# Patient Record
Sex: Female | Born: 1985 | Race: White | Hispanic: No | Marital: Married | Smoking: Former smoker
Health system: Southern US, Community
[De-identification: ages and names within clinical notes are randomized; demographics above are authoritative.]

## PROBLEM LIST (undated history)

## (undated) ENCOUNTER — Inpatient Hospital Stay: Payer: Self-pay

## (undated) DIAGNOSIS — E039 Hypothyroidism, unspecified: Secondary | ICD-10-CM

## (undated) DIAGNOSIS — F4310 Post-traumatic stress disorder, unspecified: Secondary | ICD-10-CM

## (undated) DIAGNOSIS — F32A Depression, unspecified: Secondary | ICD-10-CM

## (undated) DIAGNOSIS — D649 Anemia, unspecified: Secondary | ICD-10-CM

## (undated) DIAGNOSIS — K219 Gastro-esophageal reflux disease without esophagitis: Secondary | ICD-10-CM

## (undated) DIAGNOSIS — I10 Essential (primary) hypertension: Secondary | ICD-10-CM

## (undated) DIAGNOSIS — O99210 Obesity complicating pregnancy, unspecified trimester: Secondary | ICD-10-CM

## (undated) HISTORY — PX: FRACTURE SURGERY: SHX138

---
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
## 2007-03-28 ENCOUNTER — Emergency Department: Payer: Self-pay | Admitting: Emergency Medicine

## 2009-03-26 ENCOUNTER — Ambulatory Visit: Payer: Self-pay | Admitting: Family Medicine

## 2009-08-09 ENCOUNTER — Emergency Department: Payer: Self-pay | Admitting: Emergency Medicine

## 2009-10-08 ENCOUNTER — Ambulatory Visit: Payer: Self-pay | Admitting: Family Medicine

## 2010-10-03 ENCOUNTER — Emergency Department: Payer: Self-pay | Admitting: Emergency Medicine

## 2012-11-09 ENCOUNTER — Emergency Department: Payer: Self-pay | Admitting: Emergency Medicine

## 2014-04-03 NOTE — L&D Delivery Note (Signed)
Delivery Note At 12:12 PM a viable vigorous  female infant was delivered via Vaginal, Spontaneous Delivery (Presentation: ; Occiput Posterior).  APGAR: 8, 9; weight 6 lb 11.8 oz (3055 g).   Placenta status: Intact, Spontaneous followed by some uterine atony. Cytotec 800 mcg per rectum, IV Pitocin bolus,  and bimanual resolved atony  Cord: 3 vessels with the following complications: Short cord  Anesthesia: Epidural  Episiotomy: None Lacerations: 2nd degree Suture Repair: 2.0 vicryl and 3-0 Chromic Est. Blood Loss (mL): 450  Mom to remain in L&D for 24 hours for magnesium sulfate.  Baby to Couplet care / Skin to Skin.  Steward Sames 02/10/2015, 1:18 PM

## 2014-08-03 ENCOUNTER — Other Ambulatory Visit: Payer: Self-pay | Admitting: Family Medicine

## 2014-08-03 DIAGNOSIS — Z3401 Encounter for supervision of normal first pregnancy, first trimester: Secondary | ICD-10-CM

## 2014-08-03 LAB — OB RESULTS CONSOLE HIV ANTIBODY (ROUTINE TESTING): HIV: NONREACTIVE

## 2014-08-04 LAB — OB RESULTS CONSOLE RUBELLA ANTIBODY, IGM: RUBELLA: IMMUNE

## 2014-08-04 LAB — OB RESULTS CONSOLE HEPATITIS B SURFACE ANTIGEN: Hepatitis B Surface Ag: NEGATIVE

## 2014-08-04 LAB — OB RESULTS CONSOLE VARICELLA ZOSTER ANTIBODY, IGG: VARICELLA IGG: IMMUNE

## 2014-08-07 ENCOUNTER — Ambulatory Visit: Admission: RE | Admit: 2014-08-07 | Payer: Medicaid Other | Source: Ambulatory Visit

## 2014-08-07 ENCOUNTER — Ambulatory Visit
Admission: RE | Admit: 2014-08-07 | Discharge: 2014-08-07 | Disposition: A | Discharge status: Home / Self Care | Payer: Medicaid Other | Source: Ambulatory Visit | Attending: Family Medicine | Admitting: Family Medicine

## 2014-08-07 DIAGNOSIS — Z36 Encounter for antenatal screening of mother: Secondary | ICD-10-CM | POA: Insufficient documentation

## 2014-08-07 DIAGNOSIS — Z3401 Encounter for supervision of normal first pregnancy, first trimester: Secondary | ICD-10-CM

## 2014-09-30 ENCOUNTER — Other Ambulatory Visit: Payer: Self-pay | Admitting: Advanced Practice Midwife

## 2014-09-30 DIAGNOSIS — R030 Elevated blood-pressure reading, without diagnosis of hypertension: Secondary | ICD-10-CM

## 2014-09-30 DIAGNOSIS — IMO0001 Reserved for inherently not codable concepts without codable children: Secondary | ICD-10-CM

## 2014-09-30 DIAGNOSIS — E669 Obesity, unspecified: Secondary | ICD-10-CM

## 2014-10-26 ENCOUNTER — Ambulatory Visit
Admission: RE | Admit: 2014-10-26 | Discharge: 2014-10-26 | Disposition: A | Discharge status: Home / Self Care | Payer: Medicaid Other | Source: Ambulatory Visit | Attending: Obstetrics and Gynecology | Admitting: Obstetrics and Gynecology

## 2014-10-26 VITALS — BP 119/62 | HR 98 | Temp 98.1°F | Ht 64.5 in | Wt 252.0 lb

## 2014-10-26 DIAGNOSIS — Z6841 Body Mass Index (BMI) 40.0 and over, adult: Secondary | ICD-10-CM | POA: Insufficient documentation

## 2014-10-26 DIAGNOSIS — IMO0001 Reserved for inherently not codable concepts without codable children: Secondary | ICD-10-CM

## 2014-10-26 DIAGNOSIS — E669 Obesity, unspecified: Secondary | ICD-10-CM | POA: Diagnosis not present

## 2014-10-26 DIAGNOSIS — Z3A22 22 weeks gestation of pregnancy: Secondary | ICD-10-CM | POA: Insufficient documentation

## 2014-10-26 DIAGNOSIS — Z8279 Family history of other congenital malformations, deformations and chromosomal abnormalities: Secondary | ICD-10-CM

## 2014-10-26 DIAGNOSIS — Z8249 Family history of ischemic heart disease and other diseases of the circulatory system: Secondary | ICD-10-CM | POA: Diagnosis not present

## 2014-10-26 DIAGNOSIS — O99212 Obesity complicating pregnancy, second trimester: Secondary | ICD-10-CM | POA: Insufficient documentation

## 2014-10-26 DIAGNOSIS — R030 Elevated blood-pressure reading, without diagnosis of hypertension: Secondary | ICD-10-CM

## 2014-10-26 DIAGNOSIS — O99210 Obesity complicating pregnancy, unspecified trimester: Secondary | ICD-10-CM | POA: Insufficient documentation

## 2014-10-26 HISTORY — DX: Obesity complicating pregnancy, unspecified trimester: O99.210

## 2014-10-26 LAB — US OB DETAIL + 14 WK

## 2014-10-26 NOTE — Progress Notes (Signed)
Obstetrics Admission History & Physical  CC: Elevated BP at 19 weeks (normal both prior to and subsequent to that visit)  HPI:  29 y.o. G1P0 @ [redacted]w[redacted]d (by L=11wk [place]). Pregnancy c/b: elevated BMI and one abnormal BP (138/90) at 19 weeks. Presents for recommendations for paregnancy management.    PMHx: Patient  has a past medical history of Obesity affecting pregnancy.  PSHx: She  has past surgical history that includes Fracture surgery (Right, 29 years old). Medications:  (Not in a hospital admission) Allergies: is allergic to latex. OBHx: G1 GYNHx: History of abnormal pap smears: Yes.   (ASCUS with + HPV) - most recent PAP per patienet was at the HD and was normal.                History of STIs: Yes.  (HPV).             FHx: family history includes Asthma in her brother; Cirrhosis in her paternal grandmother; Depression in her mother; Diabetes in her maternal grandfather; Emphysema in her maternal grandmother; Heart disease in her father and paternal grandfather. Soc Hx:  reports that she quit smoking about 2 months ago. She has never used smokeless tobacco. She reports that she does not drink alcohol or use illicit drugs.     FOB is involved with the pregnancy - he reports that his sister had twins that died in the neonatal period thought to be due to congenital heart disease.  Objective:  LMP 05/25/2014 (Exact Date)  119/62 13  General: Well nourished, well developed female in no acute distress.    Perinatal info:  B+/-/ R unknown / TDaP pending / RPR NR / HIV unknown/ HBsAg neg/ Pap (ASCUS with +HPV in 2014), normal this pregnancy per patient/early glucola 79  Assessment & Plan:  29 y.o. G1P0 @ [redacted]w[redacted]d (by L=11wk [place]) with elevated BMI and one elevated BP. We discussed the impact of pregnancy on blood pressure as well as the increased risk of uteroplacental insufficiency and/or development of superimposed preeclampsia in pregnancy.  The patient will need an updated EKG to assess cardiac  status prior to pregnancy.  I also recommend assessment of baseline proteinuria, kidney and liver function in early pregnancy. Would also recommend baseline TSH given elevated BMI and additional  of folic acid.   I agree with the initiation of daily  aspirin to reduce risk of developing preeclampsia. I have reviewed the most common symptoms of preeclampsia.  Goal values for BP measurement in clinic are less than 160/110 mmHg. If anti-hypertensive therapy is indicated, consider labetalol or nifedipine.    I recommend antenatal fetal assessment as outlined below. Antenatal fetal assessment: I recommend monthly ultrasound assessment of fetal growth beginning at 28 weeks. I recommend weekly antenatal testing with NST, BPP, or modified BPP starting at 34-36 weeks, and would start sooner (approximately 32 weeks) if medication is needed to keep blood pressures in goal range.    Regarding delivery planning, I recommend delivery ranging from 37-40 weeks.  Specifically, 37-38 weeks for patients with poorly or uncontrolled hypertension and 39-40 weeks for patients with well-controlled hypertension.  I have taken the liberty of scheduling a follow up ultrasound for growth in 6 weeks and a fetal echocardiogram due to the father of the baby's family history of congenital heart disease.  Thank you for the opportunity to care for [name]; we'd be happy to be involved with her care in the future.

## 2014-11-15 ENCOUNTER — Emergency Department
Admission: EM | Admit: 2014-11-15 | Discharge: 2014-11-15 | Disposition: A | Discharge status: Home / Self Care | Payer: Medicaid Other | Attending: Emergency Medicine | Admitting: Emergency Medicine

## 2014-11-15 ENCOUNTER — Emergency Department: Payer: Medicaid Other

## 2014-11-15 ENCOUNTER — Encounter: Payer: Self-pay | Admitting: Emergency Medicine

## 2014-11-15 DIAGNOSIS — Z91040 Latex allergy status: Secondary | ICD-10-CM | POA: Diagnosis not present

## 2014-11-15 DIAGNOSIS — Z7982 Long term (current) use of aspirin: Secondary | ICD-10-CM | POA: Diagnosis not present

## 2014-11-15 DIAGNOSIS — Z87891 Personal history of nicotine dependence: Secondary | ICD-10-CM | POA: Diagnosis not present

## 2014-11-15 DIAGNOSIS — Z79899 Other long term (current) drug therapy: Secondary | ICD-10-CM | POA: Diagnosis not present

## 2014-11-15 DIAGNOSIS — O9989 Other specified diseases and conditions complicating pregnancy, childbirth and the puerperium: Secondary | ICD-10-CM | POA: Insufficient documentation

## 2014-11-15 DIAGNOSIS — E669 Obesity, unspecified: Secondary | ICD-10-CM | POA: Insufficient documentation

## 2014-11-15 DIAGNOSIS — R0789 Other chest pain: Secondary | ICD-10-CM | POA: Insufficient documentation

## 2014-11-15 DIAGNOSIS — R2243 Localized swelling, mass and lump, lower limb, bilateral: Secondary | ICD-10-CM | POA: Diagnosis not present

## 2014-11-15 DIAGNOSIS — Z3A25 25 weeks gestation of pregnancy: Secondary | ICD-10-CM | POA: Insufficient documentation

## 2014-11-15 DIAGNOSIS — O99212 Obesity complicating pregnancy, second trimester: Secondary | ICD-10-CM | POA: Insufficient documentation

## 2014-11-15 LAB — BASIC METABOLIC PANEL
Anion gap: 7 (ref 5–15)
BUN: 9 mg/dL (ref 6–20)
CO2: 26 mmol/L (ref 22–32)
Calcium: 9 mg/dL (ref 8.9–10.3)
Chloride: 103 mmol/L (ref 101–111)
Creatinine, Ser: 0.58 mg/dL (ref 0.44–1.00)
Glucose, Bld: 106 mg/dL — ABNORMAL HIGH (ref 65–99)
Potassium: 4 mmol/L (ref 3.5–5.1)
SODIUM: 136 mmol/L (ref 135–145)

## 2014-11-15 LAB — CBC
HCT: 34.8 % — ABNORMAL LOW (ref 35.0–47.0)
HEMOGLOBIN: 11.5 g/dL — AB (ref 12.0–16.0)
MCH: 28.9 pg (ref 26.0–34.0)
MCHC: 32.9 g/dL (ref 32.0–36.0)
MCV: 87.7 fL (ref 80.0–100.0)
PLATELETS: 275 10*3/uL (ref 150–440)
RBC: 3.97 MIL/uL (ref 3.80–5.20)
RDW: 14.5 % (ref 11.5–14.5)
WBC: 20.8 10*3/uL — ABNORMAL HIGH (ref 3.6–11.0)

## 2014-11-15 LAB — HEPATIC FUNCTION PANEL
ALK PHOS: 58 U/L (ref 38–126)
ALT: 14 U/L (ref 14–54)
AST: 17 U/L (ref 15–41)
Albumin: 3.6 g/dL (ref 3.5–5.0)
Bilirubin, Direct: <0.1 mg/dL — ABNORMAL LOW (ref 0.1–0.5)
Total Protein: 7.4 g/dL (ref 6.5–8.1)

## 2014-11-15 LAB — TROPONIN I

## 2014-11-15 MED ORDER — IOHEXOL 350 MG/ML SOLN
100.0000 mL | Freq: Once | INTRAVENOUS | Status: AC | PRN
  Administered 2014-11-15: 100 mL via INTRAVENOUS

## 2014-11-15 NOTE — ED Notes (Signed)
Patient reports "I broke out in hives so I took benadryl then my chest started hurting and I started coughing."  Patient reports taking a baby aspirin when chest started to hurt.  Pain is mid sternal and radiates around under right breast.

## 2014-11-15 NOTE — ED Provider Notes (Signed)
[facility] Provider Note  ____________________________________________  Time seen: Approximately 6:54 AM  I have reviewed the triage vital signs and the nursing notes.   HISTORY  Chief Complaint Chest Pain    HPI Debra Bender is a 29 y.o. female at approximately [redacted] weeks gestation who presents with acute onset mid substernal chest pain radiating around to underneath her right breast.She has never had pain like this before.  She was uncomfortable while trying to sleep during the night so she was awake anyway and states that it started acutely sometime after taking Benadryl for some hives.  She has had hives in the past and she has also taken Benadryl in the past without any issues.  She states that the chest pain was severe when it occurred, sharp and stabbing beneath her sternum and radiating through around her right breast.  It was accompanied with shortness of breath and what she describes as wheezing although she has no history of asthma or COPD.  The symptoms waxed and waned between moderate and severe for about 20 minutes before resolving.  She had nausea accompanying the chest pain but no vomiting.  She has had no abdominal pain, no pelvic pain, no vaginal bleeding or discharge.  She denies fever/chills and recent respiratory symptoms.  She is a former tobacco user but quit smoking when she became pregnant.  During that time she had her chest pain she felt better if she sat up and worse when she laid flat.  She took a baby aspirin during the chest pain but it had no effect on her symptoms.  She is currently asymptomatic.  The patient states she has no history of blood clots in her legs or her lungs, has not been traveling long distances recently, and has no history of cancer.  She takes no medications other than prenatal vitamins and a daily baby aspirin because she states that "they think might have preeclampsia".   Past Medical History   Diagnosis Date  . Obesity affecting pregnancy     Patient Active Problem List   Diagnosis Date Noted  . Maternal obesity, antepartum 10/26/2014  . Elevated BP     Past Surgical History  Procedure Laterality Date  . Fracture surgery Right 29 years old    Current Outpatient Rx  Name  Route  Sig  Dispense  Refill  . aspirin 81 MG chewable tablet   Oral   Chew 81 mg by mouth daily.         . Prenatal Vit-Fe Fumarate-FA (PRENATAL MULTIVITAMIN) TABS tablet   Oral   Take 1 tablet by mouth daily at 12 noon.           Allergies Latex  Family History  Problem Relation Age of Onset  . Depression Mother   . Heart disease Father   . Asthma Brother   . Emphysema Maternal Grandmother   . Diabetes Maternal Grandfather   . Cirrhosis Paternal Grandmother   . Heart disease Paternal Grandfather     Social History Social History  Substance Use Topics  . Smoking status: Former Smoker    Quit date: 07/31/2014  . Smokeless tobacco: Never Used  . Alcohol Use: No    Review of Systems Constitutional: No fever/chills Eyes: No visual changes. ENT: No sore throat. Cardiovascular: Acute onset substernal chest pain radiating through to the right, now resolved Respiratory: Shortness of breath accompanying the chest pain now resolved Gastrointestinal: No abdominal pain.  No nausea, no vomiting.  No diarrhea.  No constipation. Genitourinary: Negative for dysuria. Musculoskeletal: Negative for back pain.  Bilateral leg swelling throughout pregnancy. Skin: Negative for rash. Neurological: Negative for headaches, focal weakness or numbness.  10-point ROS otherwise negative.  ____________________________________________   PHYSICAL EXAM:  VITAL SIGNS: ED Triage Vitals  Enc Vitals Group     BP 11/15/14 0500 125/89 mmHg     Pulse Rate 11/15/14 0458 100     Resp 11/15/14 0458 22     Temp 11/15/14 0458 98.1 F (36.7 C)     Temp Source 11/15/14 0458 Oral     SpO2 11/15/14 0458  97 %     Weight 11/15/14 0458 253 lb (114.76 kg)     Height 11/15/14 0458 5' 4.5" (1.638 m)     Head Cir --      Peak Flow --      Pain Score 11/15/14 0459 6     Pain Loc --      Pain Edu? --      Excl. in GC? --     Constitutional: Alert and oriented. Well appearing and in no acute distress. Eyes: Conjunctivae are normal. PERRL. EOMI. Head: Atraumatic. Nose: No congestion/rhinnorhea. Mouth/Throat: Mucous membranes are moist.  Oropharynx non-erythematous. Neck: No stridor.   Cardiovascular: Normal rate, regular rhythm. Grossly normal heart sounds.  Good peripheral circulation. Respiratory: Normal respiratory effort.  No retractions. Lungs CTAB. Gastrointestinal: Obese, Soft and nontender. No distention. No abdominal bruits. No CVA tenderness. Musculoskeletal: Bilateral lower extremity pitting edema, chronic according to patient.  No joint effusions. Neurologic:  Normal speech and language. No gross focal neurologic deficits are appreciated.  Skin:  Skin is warm, dry and intact. No rash noted. Psychiatric: Mood and affect are normal. Speech and behavior are normal.  ____________________________________________   LABS (all labs ordered are listed, but only abnormal results are displayed)  Labs Reviewed  BASIC METABOLIC PANEL - Abnormal; Notable for the following:    Glucose, Bld 106 (*)    All other components within normal limits  CBC - Abnormal; Notable for the following:    WBC 20.8 (*)    Hemoglobin 11.5 (*)    HCT 34.8 (*)    All other components within normal limits  HEPATIC FUNCTION PANEL - Abnormal; Notable for the following:    Total Bilirubin <0.1 (*)    Bilirubin, Direct <0.1 (*)    All other components within normal limits  TROPONIN I  URINALYSIS COMPLETEWITH MICROSCOPIC ([facility] ONLY)   ____________________________________________  EKG  ED ECG REPORT I, Milessa Hogan, the attending physician, personally viewed and interpreted this ECG.  Date:  11/15/2014 EKG Time: 04:55 Rate: 99 Rhythm: normal sinus rhythm QRS Axis: normal Intervals: normal ST/T Wave abnormalities: Inverted T-wave in lead 3, otherwise no acute abnormalities were indication of acute ischemia Conduction Disutrbances: none Narrative Interpretation: unremarkable  ____________________________________________  RADIOLOGY  I, Samyria Rudie, personally viewed and evaluated these images (2-view CXR) as part of my medical decision making.   Dg Chest 2 View  11/15/2014   CLINICAL DATA:  Acute onset of anterior chest pain. Initial encounter.  EXAM: CHEST  2 VIEW  COMPARISON:  Chest radiograph performed 11/09/2012  FINDINGS: The lungs are well-aerated. Pulmonary vascularity is at the upper limits of normal. There is no evidence of focal opacification, pleural effusion or pneumothorax.  The heart is normal in size; the mediastinal contour is within normal limits. No acute osseous abnormalities are seen.  IMPRESSION: No acute cardiopulmonary process seen.  Electronically Signed   By: Roanna Raider M.D.   On: 11/15/2014 07:00   Ct Angio Chest Pe W/cm &/or Wo Cm  11/15/2014   CLINICAL DATA:  Substernal chest pain and dyspnea.  EXAM: CT ANGIOGRAPHY CHEST WITH CONTRAST  TECHNIQUE: Multidetector CT imaging of the chest was performed using the standard protocol during bolus administration of intravenous contrast. Multiplanar CT image reconstructions and MIPs were obtained to evaluate the vascular anatomy.  CONTRAST:  OMNIPAQUE IOHEXOL 350 MG/ML SOLN  The patient is [redacted] weeks pregnant. Consent was obtained from the patient for CT angiography with IV contrast administration. The abdomen and pelvis which shielded with lead prior to CT.  COMPARISON:  Chest x-ray earlier today.  FINDINGS: The pulmonary arteries are adequately opacified. There is no evidence of pulmonary embolism. Lungs show no evidence of infiltrates, edema or nodules. Airways are widely patent. The heart size and cardiac  chambers appear normal. No pleural or pericardial fluid is seen. The thoracic aorta is also well opacified and has a normal appearance without evidence of aneurysm or dissection.  No enlarged lymph nodes are seen. Visualized bony structures are unremarkable. Visualized upper abdomen has a normal appearance.  Review of the MIP images confirms the above findings.  IMPRESSION: Normal CTA of the chest with no evidence of pulmonary embolism or other acute findings.   Electronically Signed   By: Irish Lack M.D.   On: 11/15/2014 08:55   US Venous Img Lower Bilateral  11/15/2014   CLINICAL DATA:  Bilateral leg swelling for 4 days  EXAM: BILATERAL LOWER EXTREMITY VENOUS DOPPLER ULTRASOUND  TECHNIQUE: Gray-scale sonography with graded compression, as well as color Doppler and duplex ultrasound were performed to evaluate the lower extremity deep venous systems from the level of the common femoral vein and including the common femoral, femoral, profunda femoral, popliteal and calf veins including the posterior tibial, peroneal and gastrocnemius veins when visible. The superficial great saphenous vein was also interrogated. Spectral Doppler was utilized to evaluate flow at rest and with distal augmentation maneuvers in the common femoral, femoral and popliteal veins.  COMPARISON:  None.  FINDINGS: RIGHT LOWER EXTREMITY  Common Femoral Vein: No evidence of thrombus. Normal compressibility, respiratory phasicity and response to augmentation.  Saphenofemoral Junction: No evidence of thrombus. Normal compressibility and flow on color Doppler imaging.  Profunda Femoral Vein: No evidence of thrombus. Normal compressibility and flow on color Doppler imaging.  Femoral Vein: No evidence of thrombus. Normal compressibility, respiratory phasicity and response to augmentation.  Popliteal Vein: No evidence of thrombus. Normal compressibility, respiratory phasicity and response to augmentation.  Calf Veins: No evidence of thrombus.  Normal compressibility and flow on color Doppler imaging.  Superficial Great Saphenous Vein: No evidence of thrombus. Normal compressibility and flow on color Doppler imaging.  Venous Reflux:  None.  Other Findings:  None.  LEFT LOWER EXTREMITY  Common Femoral Vein: No evidence of thrombus. Normal compressibility, respiratory phasicity and response to augmentation.  Saphenofemoral Junction: No evidence of thrombus. Normal compressibility and flow on color Doppler imaging.  Profunda Femoral Vein: No evidence of thrombus. Normal compressibility and flow on color Doppler imaging.  Femoral Vein: No evidence of thrombus. Normal compressibility, respiratory phasicity and response to augmentation.  Popliteal Vein: No evidence of thrombus. Normal compressibility, respiratory phasicity and response to augmentation.  Calf Veins: No evidence of thrombus. Normal compressibility and flow on color Doppler imaging.  Superficial Great Saphenous Vein: No evidence of thrombus. Normal compressibility and flow on color  Doppler imaging.  Venous Reflux:  None.  Other Findings:  None.  IMPRESSION: No evidence of deep venous thrombosis bilateral lower extremity.   Electronically Signed   By: Natasha Mead M.D.   On: 11/15/2014 10:51    ____________________________________________   PROCEDURES  Procedure(s) performed: None  Critical Care performed: No ____________________________________________   INITIAL IMPRESSION / ASSESSMENT AND PLAN / ED COURSE  Pertinent labs & imaging results that were available during my care of the patient were reviewed by me and considered in my medical decision making (see chart for details).  The patient is well-appearing and in no acute distress at this time.  However, her history and signs/symptoms are concerning for the possibility of pulmonary embolism.  Given that she is at high risk, I had a discussion with her about the risks and benefits of CT angiography during the second trimester of  her pregnancy.  I explained that while there is a risk to the fetus, there is also a risk to her if we miss a pulmonary embolism.  She and her father, who is present in the room, understand and had the opportunity to ask questions.  We decided to proceed with the CTA of the chest.  I explained my concerns by phone to the on-call radiologist.  Additionally, I am going to obtain bilateral lower extremity ultrasounds to evaluate for possible DVTs given her persistent peripheral edema.  I am also going to add on a hepatic function panel given that she is at 25 weeks, but her blood pressure is generally reassuring in the emergency department today.  ----------------------------------------- 11:42 AM on 11/15/2014 -----------------------------------------  The patient had a thorough workup in the emergency department today, all of which was reassuring.  She has no evidence of a PE nor of DVTs.  She has not had any additional chest pain and has had no difficulty breathing while in the emergency department.  She and her family are comfortable with the plan for discharge and follow up with her primary care provider.  I gave my usual and customary return precautions.  ____________________________________________  FINAL CLINICAL IMPRESSION(S) / ED DIAGNOSES  Final diagnoses:  Atypical chest pain      NEW MEDICATIONS STARTED DURING THIS VISIT:  New Prescriptions   No medications on file     Loleta Rose, MD 11/15/14 1725

## 2014-11-15 NOTE — ED Notes (Signed)
Pt discharged with family.

## 2014-11-15 NOTE — Discharge Instructions (Signed)
Given your chest pain and pregnancy, we pursued a thorough evaluation including a CT scan of your chest to rule out a pulmonary embolism (blood clot in her lungs) and ultrasounds of your legs to rule out blood clots in her legs (DVT).  Fortunately her workup, including her lab work, was all reassuring and unremarkable today.  Please take Tylenol as needed for chest discomfort and follow up with your primary care doctor at the next available opportunity.  If you have any new or worsening symptoms that concern you, please return immediately to the emergency department.   Chest Pain (Nonspecific) It is often hard to give a diagnosis for the cause of chest pain. There is always a chance that your pain could be related to something serious, such as a heart attack or a blood clot in the lungs. You need to follow up with your doctor. HOME CARE  If antibiotic medicine was given, take it as directed by your doctor. Finish the medicine even if you start to feel better.  For the next few days, avoid activities that bring on chest pain. Continue physical activities as told by your doctor.  Do not use any tobacco products. This includes cigarettes, chewing tobacco, and e-cigarettes.  Avoid drinking alcohol.  Only take medicine as told by your doctor.  Follow your doctor's suggestions for more testing if your chest pain does not go away.  Keep all doctor visits you made. GET HELP IF:  Your chest pain does not go away, even after treatment.  You have a rash with blisters on your chest.  You have a fever. GET HELP RIGHT AWAY IF:   You have more pain or pain that spreads to your arm, neck, jaw, back, or belly (abdomen).  You have shortness of breath.  You cough more than usual or cough up blood.  You have very bad back or belly pain.  You feel sick to your stomach (nauseous) or throw up (vomit).  You have very bad weakness.  You pass out (faint).  You have chills. This is an emergency. Do  not wait to see if the problems will go away. Call your local emergency services (911 in [place] Do not drive yourself to the hospital. MAKE SURE YOU:   Understand these instructions.  Will watch your condition.  Will get help right away if you are not doing well or get worse. Document Released: 09/06/2007 Document Revised: 03/25/2013 Document Reviewed: 09/06/2007 [employer] Patient Information 2015 [place], [place]. This information is not intended to replace advice given to you by your health care provider. Make sure you discuss any questions you have with your health care provider.

## 2014-11-15 NOTE — ED Notes (Signed)
Pt to US.

## 2014-12-03 DIAGNOSIS — D649 Anemia, unspecified: Secondary | ICD-10-CM

## 2014-12-03 DIAGNOSIS — E039 Hypothyroidism, unspecified: Secondary | ICD-10-CM

## 2014-12-03 HISTORY — DX: Hypothyroidism, unspecified: E03.9

## 2014-12-03 HISTORY — DX: Anemia, unspecified: D64.9

## 2014-12-10 ENCOUNTER — Ambulatory Visit
Admission: RE | Admit: 2014-12-10 | Discharge: 2014-12-10 | Disposition: A | Discharge status: Home / Self Care | Payer: Medicaid Other | Source: Ambulatory Visit | Attending: Obstetrics and Gynecology | Admitting: Obstetrics and Gynecology

## 2014-12-10 VITALS — BP 122/75 | HR 95 | Temp 97.9°F | Resp 18 | Ht 63.6 in | Wt 264.6 lb

## 2014-12-10 DIAGNOSIS — O99210 Obesity complicating pregnancy, unspecified trimester: Secondary | ICD-10-CM

## 2014-12-10 DIAGNOSIS — E669 Obesity, unspecified: Secondary | ICD-10-CM | POA: Insufficient documentation

## 2014-12-10 DIAGNOSIS — Z3A28 28 weeks gestation of pregnancy: Secondary | ICD-10-CM | POA: Insufficient documentation

## 2014-12-10 DIAGNOSIS — R030 Elevated blood-pressure reading, without diagnosis of hypertension: Secondary | ICD-10-CM

## 2014-12-10 DIAGNOSIS — O99212 Obesity complicating pregnancy, second trimester: Secondary | ICD-10-CM | POA: Insufficient documentation

## 2014-12-10 DIAGNOSIS — O99213 Obesity complicating pregnancy, third trimester: Secondary | ICD-10-CM

## 2014-12-10 DIAGNOSIS — IMO0001 Reserved for inherently not codable concepts without codable children: Secondary | ICD-10-CM

## 2015-01-06 ENCOUNTER — Observation Stay
Admission: EM | Admit: 2015-01-06 | Discharge: 2015-01-06 | Disposition: A | Discharge status: Home / Self Care | Payer: Medicaid Other | Attending: Obstetrics & Gynecology | Admitting: Obstetrics & Gynecology

## 2015-01-06 DIAGNOSIS — Z91040 Latex allergy status: Secondary | ICD-10-CM | POA: Diagnosis not present

## 2015-01-06 DIAGNOSIS — O99019 Anemia complicating pregnancy, unspecified trimester: Secondary | ICD-10-CM | POA: Insufficient documentation

## 2015-01-06 DIAGNOSIS — O99280 Endocrine, nutritional and metabolic diseases complicating pregnancy, unspecified trimester: Secondary | ICD-10-CM | POA: Insufficient documentation

## 2015-01-06 DIAGNOSIS — Z3A00 Weeks of gestation of pregnancy not specified: Secondary | ICD-10-CM | POA: Diagnosis not present

## 2015-01-06 DIAGNOSIS — O99210 Obesity complicating pregnancy, unspecified trimester: Principal | ICD-10-CM | POA: Insufficient documentation

## 2015-01-06 DIAGNOSIS — E039 Hypothyroidism, unspecified: Secondary | ICD-10-CM | POA: Diagnosis not present

## 2015-01-06 DIAGNOSIS — Z6841 Body Mass Index (BMI) 40.0 and over, adult: Secondary | ICD-10-CM | POA: Diagnosis not present

## 2015-01-06 HISTORY — DX: Anemia, unspecified: D64.9

## 2015-01-06 HISTORY — DX: Hypothyroidism, unspecified: E03.9

## 2015-01-06 NOTE — Discharge Instructions (Signed)
Return to labor and delivery next Wednesday for NST. Keep regular scheduled appts. At office.

## 2015-01-06 NOTE — OB Triage Note (Signed)
Pt presenting to hospital for weekly NST.

## 2015-01-07 ENCOUNTER — Ambulatory Visit
Admission: RE | Admit: 2015-01-07 | Discharge: 2015-01-07 | Disposition: A | Discharge status: Home / Self Care | Payer: Medicaid Other | Source: Ambulatory Visit | Attending: Maternal & Fetal Medicine | Admitting: Maternal & Fetal Medicine

## 2015-01-07 VITALS — BP 124/91 | HR 92 | Temp 97.7°F | Resp 18 | Ht 64.8 in | Wt 267.4 lb

## 2015-01-07 DIAGNOSIS — O99210 Obesity complicating pregnancy, unspecified trimester: Secondary | ICD-10-CM

## 2015-01-07 DIAGNOSIS — O99212 Obesity complicating pregnancy, second trimester: Secondary | ICD-10-CM

## 2015-01-07 DIAGNOSIS — O99213 Obesity complicating pregnancy, third trimester: Secondary | ICD-10-CM | POA: Insufficient documentation

## 2015-01-07 DIAGNOSIS — Z3A32 32 weeks gestation of pregnancy: Secondary | ICD-10-CM | POA: Insufficient documentation

## 2015-01-07 DIAGNOSIS — IMO0001 Reserved for inherently not codable concepts without codable children: Secondary | ICD-10-CM

## 2015-01-07 DIAGNOSIS — R030 Elevated blood-pressure reading, without diagnosis of hypertension: Secondary | ICD-10-CM

## 2015-01-13 ENCOUNTER — Observation Stay
Admission: RE | Admit: 2015-01-13 | Discharge: 2015-01-13 | Disposition: A | Discharge status: Home / Self Care | Payer: Medicaid Other | Source: Ambulatory Visit | Attending: Obstetrics and Gynecology | Admitting: Obstetrics and Gynecology

## 2015-01-13 ENCOUNTER — Encounter: Payer: Self-pay | Admitting: *Deleted

## 2015-01-13 DIAGNOSIS — Z3A34 34 weeks gestation of pregnancy: Secondary | ICD-10-CM | POA: Insufficient documentation

## 2015-01-13 DIAGNOSIS — O0993 Supervision of high risk pregnancy, unspecified, third trimester: Secondary | ICD-10-CM | POA: Insufficient documentation

## 2015-01-13 DIAGNOSIS — O99283 Endocrine, nutritional and metabolic diseases complicating pregnancy, third trimester: Secondary | ICD-10-CM | POA: Diagnosis not present

## 2015-01-13 DIAGNOSIS — Z3689 Encounter for other specified antenatal screening: Secondary | ICD-10-CM

## 2015-01-13 DIAGNOSIS — O163 Unspecified maternal hypertension, third trimester: Secondary | ICD-10-CM | POA: Diagnosis present

## 2015-01-13 DIAGNOSIS — O99213 Obesity complicating pregnancy, third trimester: Secondary | ICD-10-CM | POA: Insufficient documentation

## 2015-01-13 DIAGNOSIS — E079 Disorder of thyroid, unspecified: Secondary | ICD-10-CM | POA: Insufficient documentation

## 2015-01-13 DIAGNOSIS — Z7982 Long term (current) use of aspirin: Secondary | ICD-10-CM | POA: Insufficient documentation

## 2015-01-13 LAB — PROTEIN / CREATININE RATIO, URINE
Creatinine, Urine: 85 mg/dL
PROTEIN CREATININE RATIO: 0.12 mg/mg{creat} (ref 0.00–0.15)
TOTAL PROTEIN, URINE: 10 mg/dL

## 2015-01-13 NOTE — Plan of Care (Signed)
Off monitor up to dress for discharge.  Discharge teaching done.  Signed copy on chart and one in hand.

## 2015-01-13 NOTE — Procedures (Signed)
Fetal heart rate: 130, moderate variability, +accels, no decels Toco: absent  Reactive cateegory I tracing >1930min of monitoring

## 2015-01-13 NOTE — Final Progress Note (Signed)
Physician Final Progress Note  Patient ID: Debra Bender Kotara MRN: [id number] DOB/AGE: [birth year] 29 y.o.  Admit date: 01/13/2015 Admitting provider: Vena AustriaAndreas Kendyn Zaman, MD Discharge date: 01/13/2015   Admission Diagnoses:  1) Hypertension complicating pregnancy  Discharge Diagnoses:  Active Problems:   NST (non-stress test) reactive   Consults: None  Significant Findings/ Diagnostic Studies:  Results for orders placed or performed during the hospital encounter of 01/13/15 (from the past 24 hour(s))  Protein / creatinine ratio, urine     Status: None   Collection Time: 01/13/15 12:40 PM  Result Value Ref Range   Creatinine, Urine 85 mg/dL   Total Protein, Urine 10 mg/dL   Protein Creatinine Ratio 0.12 0.00 - 0.15 mg/mg[Cre]    Procedures: NST  Discharge Condition: good  Disposition: 01-Home or Self Care  Diet: Regular diet  Discharge Activity: Activity as tolerated     Medication List    ASK your doctor about these medications        aspirin 81 MG chewable tablet  Chew 81 mg by mouth daily.     ferrous sulfate 325 (65 FE) MG tablet  Take 325 mg by mouth.     folic acid 800 MCG tablet  Commonly known as:  FOLVITE  Take 400 mcg by mouth daily.     levothyroxine 100 MCG tablet  Commonly known as:  SYNTHROID, LEVOTHROID  Take 100 mcg by mouth daily before breakfast.     prenatal multivitamin Tabs tablet  Take 1 tablet by mouth daily at 12 noon.         Total time spent taking care of this patient: 30 minutes  Signed: Lorrene ReidSTAEBLER, Anabelle Bungert M 01/13/2015, 2:19 PM

## 2015-01-13 NOTE — OB Triage Note (Signed)
Sent from [facility] per Dr. Bonney AidStaebler. Preeclampsia evaluation.

## 2015-01-20 ENCOUNTER — Inpatient Hospital Stay
Admission: RE | Admit: 2015-01-20 | Discharge: 2015-01-20 | Disposition: A | Discharge status: Home / Self Care | Payer: Medicaid Other | Attending: Obstetrics and Gynecology | Admitting: Obstetrics and Gynecology

## 2015-01-20 DIAGNOSIS — O169 Unspecified maternal hypertension, unspecified trimester: Secondary | ICD-10-CM | POA: Diagnosis not present

## 2015-01-20 DIAGNOSIS — E079 Disorder of thyroid, unspecified: Secondary | ICD-10-CM | POA: Diagnosis not present

## 2015-01-20 DIAGNOSIS — Z3A34 34 weeks gestation of pregnancy: Secondary | ICD-10-CM | POA: Insufficient documentation

## 2015-01-20 DIAGNOSIS — O163 Unspecified maternal hypertension, third trimester: Secondary | ICD-10-CM | POA: Insufficient documentation

## 2015-01-20 DIAGNOSIS — O99213 Obesity complicating pregnancy, third trimester: Secondary | ICD-10-CM | POA: Insufficient documentation

## 2015-01-20 NOTE — Discharge Instructions (Signed)
[facility] scheduled [facility]  Call if any questions or concerns.  [phone] Ellison CarwinC Joaovictor Krone [facility]

## 2015-01-20 NOTE — Procedures (Signed)
FETAL SURVEILLANCE TESTING SUMMARY  INDICATIONS: 1) supervision of high risk pregnancy, third trimester 2) obesity affecting pregnancy, third trimester 3) BMI 40 4) hypertension affecting pregnancy, third trimester 5) thyroid disease affecting pregnancy, third trimester 6) 7034 weeks gestational age  OBJECTIVE RESULTS: Fetal heart variability: moderate Fetal Heart Rate decelerations: none Fetal Heart Rate accelerations: yes Baseline FHR: 135 per minute Fetal Non-stress Test: reactive Uterine irritability: yes  Fetal surveillance: reassuring

## 2015-01-21 ENCOUNTER — Observation Stay
Admission: EM | Admit: 2015-01-21 | Discharge: 2015-01-21 | Disposition: A | Discharge status: Home / Self Care | Payer: Medicaid Other | Attending: Obstetrics & Gynecology | Admitting: Obstetrics & Gynecology

## 2015-01-21 DIAGNOSIS — Z3A35 35 weeks gestation of pregnancy: Secondary | ICD-10-CM | POA: Insufficient documentation

## 2015-01-21 DIAGNOSIS — O99213 Obesity complicating pregnancy, third trimester: Secondary | ICD-10-CM

## 2015-01-21 DIAGNOSIS — O479 False labor, unspecified: Secondary | ICD-10-CM | POA: Diagnosis present

## 2015-01-21 LAB — PROTEIN / CREATININE RATIO, URINE: CREATININE, URINE: 21 mg/dL

## 2015-01-21 MED ORDER — ONDANSETRON HCL 4 MG/2ML IJ SOLN: 4.0000 mg | Freq: Four times a day (QID) | INTRAMUSCULAR | Status: DC | PRN

## 2015-01-21 MED ORDER — ACETAMINOPHEN 325 MG PO TABS: 650.0000 mg | ORAL_TABLET | ORAL | Status: DC | PRN

## 2015-01-27 ENCOUNTER — Observation Stay
Admission: RE | Admit: 2015-01-27 | Discharge: 2015-01-27 | Disposition: A | Discharge status: Home / Self Care | Payer: Medicaid Other | Attending: Obstetrics & Gynecology | Admitting: Obstetrics & Gynecology

## 2015-01-27 ENCOUNTER — Encounter: Payer: Self-pay | Admitting: *Deleted

## 2015-01-27 DIAGNOSIS — O99283 Endocrine, nutritional and metabolic diseases complicating pregnancy, third trimester: Secondary | ICD-10-CM | POA: Insufficient documentation

## 2015-01-27 DIAGNOSIS — Z3A36 36 weeks gestation of pregnancy: Secondary | ICD-10-CM | POA: Diagnosis not present

## 2015-01-27 DIAGNOSIS — E079 Disorder of thyroid, unspecified: Secondary | ICD-10-CM | POA: Diagnosis not present

## 2015-01-27 DIAGNOSIS — E669 Obesity, unspecified: Secondary | ICD-10-CM | POA: Diagnosis not present

## 2015-01-27 DIAGNOSIS — O479 False labor, unspecified: Secondary | ICD-10-CM | POA: Diagnosis present

## 2015-01-27 DIAGNOSIS — Z6841 Body Mass Index (BMI) 40.0 and over, adult: Secondary | ICD-10-CM | POA: Insufficient documentation

## 2015-01-27 DIAGNOSIS — O99213 Obesity complicating pregnancy, third trimester: Secondary | ICD-10-CM

## 2015-01-27 DIAGNOSIS — O163 Unspecified maternal hypertension, third trimester: Principal | ICD-10-CM | POA: Insufficient documentation

## 2015-01-27 LAB — COMPREHENSIVE METABOLIC PANEL
ALK PHOS: 84 U/L (ref 38–126)
ALT: 10 U/L — AB (ref 14–54)
AST: 15 U/L (ref 15–41)
Albumin: 3 g/dL — ABNORMAL LOW (ref 3.5–5.0)
Anion gap: 6 (ref 5–15)
BUN: 8 mg/dL (ref 6–20)
CALCIUM: 9 mg/dL (ref 8.9–10.3)
CHLORIDE: 106 mmol/L (ref 101–111)
CO2: 25 mmol/L (ref 22–32)
CREATININE: 0.64 mg/dL (ref 0.44–1.00)
Glucose, Bld: 87 mg/dL (ref 65–99)
Potassium: 4.4 mmol/L (ref 3.5–5.1)
Sodium: 137 mmol/L (ref 135–145)
Total Bilirubin: 0.6 mg/dL (ref 0.3–1.2)
Total Protein: 6.8 g/dL (ref 6.5–8.1)

## 2015-01-27 LAB — CBC
HEMATOCRIT: 34.4 % — AB (ref 35.0–47.0)
HEMOGLOBIN: 11.4 g/dL — AB (ref 12.0–16.0)
MCH: 28.7 pg (ref 26.0–34.0)
MCHC: 33 g/dL (ref 32.0–36.0)
MCV: 87.1 fL (ref 80.0–100.0)
PLATELETS: 245 10*3/uL (ref 150–440)
RBC: 3.95 MIL/uL (ref 3.80–5.20)
RDW: 15.3 % — ABNORMAL HIGH (ref 11.5–14.5)
WBC: 13.9 10*3/uL — AB (ref 3.6–11.0)

## 2015-01-27 LAB — PROTEIN / CREATININE RATIO, URINE
Creatinine, Urine: 74 mg/dL
Protein Creatinine Ratio: 0.12 mg/mg{Cre} (ref 0.00–0.15)
Total Protein, Urine: 9 mg/dL

## 2015-01-27 LAB — URIC ACID: URIC ACID, SERUM: 5.4 mg/dL (ref 2.3–6.6)

## 2015-01-27 MED ORDER — ACETAMINOPHEN 325 MG PO TABS: 650.0000 mg | ORAL_TABLET | ORAL | Status: DC | PRN

## 2015-01-27 NOTE — Discharge Summary (Signed)
Patient discharged home, discharge instructions given, patient states understanding. Patient left floor in stable condition, denies any other needs at this time. Patient to keep next scheduled OB appointment 

## 2015-01-27 NOTE — Discharge Instructions (Signed)
Drink plenty of fluids and get plenty of rest. Call your provider for any other concerns. °

## 2015-01-27 NOTE — Procedures (Signed)
FETAL SURVEILLANCE TESTING SUMMARY  INDICATIONS: 1) supervision of high risk pregnancy, third trimester 2) obesity affecting pregnancy, third trimester 3) BMI 40 4) hypertension affecting pregnancy, third trimester 5) thyroid disease affecting pregnancy, third trimester 6) 6236 weeks gestational age  OBJECTIVE RESULTS: A NST procedure was performed with FHR monitoring and a normal baseline established, appropriate time of 20-40 minutes of evaluation, and accels >2 seen w 15x15 characteristics.  Results show a REACTIVE NST.   Baseline FHR: 135 per minute Uterine irritability: yes  Fetal surveillance: reassuring

## 2015-01-27 NOTE — Final Progress Note (Signed)
Physician Final Progress Note  Patient ID: Debra Bender Moler MRN: [id number] DOB/AGE: [birth year] 29 y.o.  Admit date: 01/27/2015 Admitting provider: Nadara Mustardobert P Destry Dauber, MD Discharge date: 01/27/2015   Admission Diagnoses: Obesity, third trimester; Hypothyroidism, Hypertension  Discharge Diagnoses:  Active Problems:   Irregular contractions  Pt has no headache, blurry vision, CP, SOB, edema today.  Consults: None  Significant Findings/ Diagnostic Studies: labs: no signs of preclampsia  Procedures: NST, see note (R)  Discharge Condition: good  Disposition: 01-Home or Self Care  Diet: Regular diet and Encourage fluids  Discharge Activity: Activity as tolerated     Medication List    TAKE these medications        aspirin 81 MG chewable tablet  Chew 81 mg by mouth daily.     cholecalciferol 1000 UNITS tablet  Commonly known as:  VITAMIN Bender  Take 1,000 Units by mouth daily.     ferrous sulfate 325 (65 FE) MG tablet  Take 325 mg by mouth.     folic acid 800 MCG tablet  Commonly known as:  FOLVITE  Take 400 mcg by mouth daily.     levothyroxine 100 MCG tablet  Commonly known as:  SYNTHROID, LEVOTHROID  Take 100 mcg by mouth daily before breakfast.     prenatal multivitamin Tabs tablet  Take 1 tablet by mouth daily at 12 noon.       Follow-up Information    Follow up tomorrow in Clinic and In 1 week here for NST and BP rechecks.      Follow up with [facility] In 1 week.   Specialty:  Emergency Medicine   Contact information:   [address] [id number] [place] [id number] [phone]      Total time spent taking care of this patient: 15 minutes  Signed: Jannifer Fischler Renae FickleUL 01/27/2015, 2:09 PM

## 2015-02-01 ENCOUNTER — Ambulatory Visit
Admission: RE | Admit: 2015-02-01 | Discharge: 2015-02-01 | Disposition: A | Discharge status: Home / Self Care | Payer: Medicaid Other | Source: Ambulatory Visit | Attending: Obstetrics and Gynecology | Admitting: Obstetrics and Gynecology

## 2015-02-01 ENCOUNTER — Inpatient Hospital Stay
Admission: RE | Admit: 2015-02-01 | Discharge: 2015-02-01 | Disposition: A | Discharge status: Home / Self Care | Payer: Medicaid Other | Attending: Certified Nurse Midwife | Admitting: Certified Nurse Midwife

## 2015-02-01 VITALS — BP 123/97 | HR 89 | Temp 98.4°F | Wt 274.0 lb

## 2015-02-01 DIAGNOSIS — Z6841 Body Mass Index (BMI) 40.0 and over, adult: Secondary | ICD-10-CM | POA: Diagnosis not present

## 2015-02-01 DIAGNOSIS — O99213 Obesity complicating pregnancy, third trimester: Secondary | ICD-10-CM | POA: Insufficient documentation

## 2015-02-01 DIAGNOSIS — Z3A36 36 weeks gestation of pregnancy: Secondary | ICD-10-CM | POA: Diagnosis not present

## 2015-02-01 DIAGNOSIS — O99210 Obesity complicating pregnancy, unspecified trimester: Secondary | ICD-10-CM

## 2015-02-01 DIAGNOSIS — Z36 Encounter for antenatal screening of mother: Secondary | ICD-10-CM | POA: Diagnosis present

## 2015-02-01 NOTE — OB Triage Note (Signed)
Patient reporting for NST.

## 2015-02-01 NOTE — Progress Notes (Addendum)
L&D Triage Note   29 year old G1 P0 with EDC=03/01/2015 by a LMP=05/25/2014 [facility] N[facility]. Pregnancy c/b hypothyroidism, obesity, elevated blood pressure, and anemia.  [facility] revealed a normal AFI. EFW per patient was 6#.  Exam: BP 126/84 mmHg  Pulse 91  Temp(Src) 98.9 F (37.2 C) (Oral)  Resp 18  LMP 05/25/2014 (Exact Date)   [id number] with accelerations to 160s, moderate variability Toco: acontractile  A: IUP at 36 weeks with reactive NST P: DC home RTN  In 3 days  for NST Daily FKCs  Angello Chien, CNM

## 2015-02-01 NOTE — Progress Notes (Signed)
Patient was on monitor for 35 minutes.  NST was reactive.  Baseline 135, Moderate Variability, Accelerations 15x15.

## 2015-02-03 LAB — OB RESULTS CONSOLE GBS: GBS: POSITIVE

## 2015-02-04 ENCOUNTER — Encounter: Payer: Self-pay | Admitting: *Deleted

## 2015-02-04 ENCOUNTER — Observation Stay
Admission: EM | Admit: 2015-02-04 | Discharge: 2015-02-04 | Disposition: A | Discharge status: Home / Self Care | Payer: Medicaid Other | Attending: Obstetrics and Gynecology | Admitting: Obstetrics and Gynecology

## 2015-02-04 DIAGNOSIS — E669 Obesity, unspecified: Secondary | ICD-10-CM | POA: Insufficient documentation

## 2015-02-04 DIAGNOSIS — O169 Unspecified maternal hypertension, unspecified trimester: Secondary | ICD-10-CM | POA: Diagnosis present

## 2015-02-04 DIAGNOSIS — O133 Gestational [pregnancy-induced] hypertension without significant proteinuria, third trimester: Secondary | ICD-10-CM | POA: Diagnosis present

## 2015-02-04 DIAGNOSIS — Z6841 Body Mass Index (BMI) 40.0 and over, adult: Secondary | ICD-10-CM | POA: Insufficient documentation

## 2015-02-04 DIAGNOSIS — E039 Hypothyroidism, unspecified: Secondary | ICD-10-CM | POA: Diagnosis not present

## 2015-02-04 DIAGNOSIS — O26893 Other specified pregnancy related conditions, third trimester: Secondary | ICD-10-CM | POA: Insufficient documentation

## 2015-02-04 DIAGNOSIS — Z3A36 36 weeks gestation of pregnancy: Secondary | ICD-10-CM | POA: Diagnosis not present

## 2015-02-04 LAB — CBC
HCT: 33.2 % — ABNORMAL LOW (ref 35.0–47.0)
HEMOGLOBIN: 10.9 g/dL — AB (ref 12.0–16.0)
MCH: 28.6 pg (ref 26.0–34.0)
MCHC: 32.9 g/dL (ref 32.0–36.0)
MCV: 86.9 fL (ref 80.0–100.0)
Platelets: 234 10*3/uL (ref 150–440)
RBC: 3.82 MIL/uL (ref 3.80–5.20)
RDW: 15.1 % — ABNORMAL HIGH (ref 11.5–14.5)
WBC: 14.8 10*3/uL — AB (ref 3.6–11.0)

## 2015-02-04 LAB — PROTEIN / CREATININE RATIO, URINE
Creatinine, Urine: 39 mg/dL
Total Protein, Urine: <6 mg/dL

## 2015-02-04 LAB — COMPREHENSIVE METABOLIC PANEL
ALBUMIN: 3 g/dL — AB (ref 3.5–5.0)
ALT: 11 U/L — ABNORMAL LOW (ref 14–54)
ANION GAP: 8 (ref 5–15)
AST: 15 U/L (ref 15–41)
Alkaline Phosphatase: 73 U/L (ref 38–126)
BILIRUBIN TOTAL: 0.1 mg/dL — AB (ref 0.3–1.2)
BUN: 12 mg/dL (ref 6–20)
CHLORIDE: 104 mmol/L (ref 101–111)
CO2: 24 mmol/L (ref 22–32)
Calcium: 9.1 mg/dL (ref 8.9–10.3)
Creatinine, Ser: 0.54 mg/dL (ref 0.44–1.00)
GFR calc Af Amer: >60 mL/min (ref >60)
GFR calc non Af Amer: >60 mL/min (ref >60)
GLUCOSE: 99 mg/dL (ref 65–99)
POTASSIUM: 4.5 mmol/L (ref 3.5–5.1)
SODIUM: 136 mmol/L (ref 135–145)
Total Protein: 7 g/dL (ref 6.5–8.1)

## 2015-02-04 LAB — TYPE AND SCREEN
ABO/RH(D): B POS
Antibody Screen: NEGATIVE

## 2015-02-04 LAB — ABO/RH: ABO/RH(D): B POS

## 2015-02-04 MED ORDER — LACTATED RINGERS IV SOLN: 500.0000 mL | INTRAVENOUS | Status: DC | PRN

## 2015-02-04 NOTE — Discharge Summary (Signed)
OB Triage Discharge Summary   29 y.o. G1P0 @ 2661w3d presenting for scheduled antenatal testing (NST).  Fetal status reassuring. EFM showed reactive NST. Triage course: BPs in the mild range consistently but exam, ROS and lab work negative for pre-eclampsia  Labs pending: none RTC: will return @ 36/6 for cx ripening and IOL for gHTN Disposition: Home  [place] Bingharlie Mikkel Charrette, Montez HagemanJr MD [facility]  Pager: [phone]

## 2015-02-04 NOTE — OB Triage Note (Signed)
29yo 1841w1d [facility] pt presents for biweekly NST

## 2015-02-04 NOTE — Discharge Instructions (Signed)
Come in for evaluation if your blood pressures are consistently greater than 150/105 or any of the signs and symptoms below  Hypertension During Pregnancy Hypertension, or high blood pressure, is when there is extra pressure inside your blood vessels that carry blood from the heart to the rest of your body (arteries). It can happen at any time in life, including pregnancy. Hypertension during pregnancy can cause problems for you and your baby. Your baby might not weigh as much as he or she should at birth or might be born early (premature). Very bad cases of hypertension during pregnancy can be life-threatening.  Different types of hypertension can occur during pregnancy. These include:  Chronic hypertension. This happens when a woman has hypertension before pregnancy and it continues during pregnancy.  Gestational hypertension. This is when hypertension develops during pregnancy.  Preeclampsia or toxemia of pregnancy. This is a very serious type of hypertension that develops only during pregnancy. It affects the whole body and can be very dangerous for both mother and baby.  Gestational hypertension and preeclampsia usually go away after your baby is born. Your blood pressure will likely stabilize within 6 weeks. Women who have hypertension during pregnancy have a greater chance of developing hypertension later in life or with future pregnancies. RISK FACTORS There are certain factors that make it more likely for you to develop hypertension during pregnancy. These include:  Having hypertension before pregnancy.  Having hypertension during a previous pregnancy.  Being overweight.  Being older than 40 years.  Being pregnant with more than one baby.  Having diabetes or kidney problems. SIGNS AND SYMPTOMS Chronic and gestational hypertension rarely cause symptoms. Preeclampsia has symptoms, which may include:  Increased protein in your urine. Your health care provider will check for this  at every prenatal visit.  Swelling of your hands and face.  Rapid weight gain.  Headaches.  Visual changes.  Being bothered by light.  Abdominal pain, especially in the upper right area.  Chest pain.  Shortness of breath.  Increased reflexes.  Seizures. These occur with a more severe form of preeclampsia, called eclampsia. DIAGNOSIS  You may be diagnosed with hypertension during a regular prenatal exam. At each prenatal visit, you may have:  Your blood pressure checked.  A urine test to check for protein in your urine. The type of hypertension you are diagnosed with depends on when you developed it. It also depends on your specific blood pressure reading.  Developing hypertension before 20 weeks of pregnancy is consistent with chronic hypertension.  Developing hypertension after 20 weeks of pregnancy is consistent with gestational hypertension.  Hypertension with increased urinary protein is diagnosed as preeclampsia.  Blood pressure measurements that stay above 160 systolic or 110 diastolic are a sign of severe preeclampsia. TREATMENT Treatment for hypertension during pregnancy varies. Treatment depends on the type of hypertension and how serious it is.  If you take medicine for chronic hypertension, you may need to switch medicines.  Medicines called ACE inhibitors should not be taken during pregnancy.  Low-dose aspirin may be suggested for women who have risk factors for preeclampsia.  If you have gestational hypertension, you may need to take a blood pressure medicine that is safe during pregnancy. Your health care provider will recommend the correct medicine.  If you have severe preeclampsia, you may need to be in the hospital. Health care providers will watch you and your baby very closely. You also may need to take medicine called magnesium sulfate to prevent seizures and  lower blood pressure.  Sometimes, an early delivery is needed. This may be the case if the  condition worsens. It would be done to protect you and your baby. The only cure for preeclampsia is delivery.  Your health care provider may recommend that you take one low-dose aspirin (81 mg) each day to help prevent high blood pressure during your pregnancy if you are at risk for preeclampsia. You may be at risk for preeclampsia if:  You had preeclampsia or eclampsia during a previous pregnancy.  Your baby did not grow as expected during a previous pregnancy.  You experienced preterm birth with a previous pregnancy.  You experienced a separation of the placenta from the uterus (placental abruption) during a previous pregnancy.  You experienced the loss of your baby during a previous pregnancy.  You are pregnant with more than one baby.  You have other medical conditions, such as diabetes or an autoimmune disease. HOME CARE INSTRUCTIONS  Schedule and keep all of your regular prenatal care appointments. This is important.  Take medicines only as directed by your health care provider. Tell your health care provider about all medicines you take.  Eat as little salt as possible.  Get regular exercise.  Do not drink alcohol.  Do not use tobacco products.  Do not drink products with caffeine.  Lie on your left side when resting. SEEK IMMEDIATE MEDICAL CARE IF:  You have severe abdominal pain.  You have sudden swelling in your hands, ankles, or face.  You gain 4 pounds (1.8 kg) or more in 1 week.  You vomit repeatedly.  You have vaginal bleeding.  You do not feel your baby moving as much.  You have a headache.  You have blurred or double vision.  You have muscle twitching or spasms.  You have shortness of breath.  You have blue fingernails or lips.  You have blood in your urine. MAKE SURE YOU:  Understand these instructions.  Will watch your condition.  Will get help right away if you are not doing well or get worse.   This information is not intended to  replace advice given to you by your health care provider. Make sure you discuss any questions you have with your health care provider.   Document Released: 12/06/2010 Document Revised: 04/10/2014 Document Reviewed: 10/17/2012 Elsevier Interactive Patient Education [employer]2016 Elsevier Inc.

## 2015-02-04 NOTE — H&P (Signed)
Obstetrics Admission History & Physical  02/04/2015 - 1:48 PM Primary OBGYN: [facility]  Chief Complaint: antepartum testing  History of Present Illness  [age] y.o. G1P0 @ 2138w3d (EDC 11/28), with the above CC. Pregnancy complicated by: BMI 40, hypothyroidism, gHTN, GBS positive.  Debra Bender is here for routine AP testing. She is getting 2x/wk testing and currently denies any s/s of decreased FM, PTL, or pre-eclampsia.   Review of Systems: her 12 point review of systems is negative or as noted in the History of Present Illness.  PMHx:  Past Medical History  Diagnosis Date  . Obesity affecting pregnancy   . Hypothyroidism 12/2014  . Anemia 12/2014   PSHx:  Past Surgical History  Procedure Laterality Date  . Fracture surgery Right 29 years old   Medications:  Prescriptions prior to admission  Medication Sig Dispense Refill Last Dose  . aspirin 81 MG chewable tablet Chew 81 mg by mouth daily.   Taking  . cholecalciferol (VITAMIN D) 1000 UNITS tablet Take 1,000 Units by mouth daily.   Taking  . ferrous sulfate 325 (65 FE) MG tablet Take 325 mg by mouth.   Taking  . folic acid (FOLVITE) 800 MCG tablet Take 400 mcg by mouth daily.   Taking  . levothyroxine (SYNTHROID, LEVOTHROID) 100 MCG tablet Take 100 mcg by mouth daily before breakfast.   Taking  . Prenatal Vit-Fe Fumarate-FA (PRENATAL MULTIVITAMIN) TABS tablet Take 1 tablet by mouth daily at 12 noon.   Taking     Allergies: is allergic to latex. OBHx:  OB History  Gravida Para Term Preterm AB SAB TAB Ectopic Multiple Living  1             # Outcome Date GA Lbr Len/2nd Weight Sex Delivery Anes PTL Lv  1 Current                       FHx:  Family History  Problem Relation Age of Onset  . Depression Mother   . Heart disease Father   . Asthma Brother   . Emphysema Maternal Grandmother   . Diabetes Maternal Grandfather   . Cirrhosis Paternal Grandmother   . Heart disease Paternal Grandfather    Soc Hx:  Social History    Social History  . Marital Status: Single    Spouse Name: N/A  . Number of Children: N/A  . Years of Education: N/A   Occupational History  . Not on file.   Social History Main Topics  . Smoking status: Former Smoker    Quit date: 07/31/2014  . Smokeless tobacco: Never Used  . Alcohol Use: No  . Drug Use: No  . Sexual Activity: Yes   Other Topics Concern  . Not on file   Social History Narrative    Objective    Current Vital Signs 24h Vital Sign Ranges  T 98.1 F (36.7 C) Temp  Avg: 98.1 F (36.7 C)  Min: 98.1 F (36.7 C)  Max: 98.1 F (36.7 C)  BP (!) 146/94 mmHg BP  Min: 118/75  Max: 151/104  HR 89 Pulse  Avg: 90.4  Min: 85  Max: 97  RR  16 No Data Recorded  SaO2    98/RA No Data Recorded       24 Hour I/O Current Shift I/O  Time Ins Outs       EFM: 135 baseline, +accels, no decels, mod variability  Toco: quiet  General: Well nourished, well developed  female in no acute distress.  Skin:  Warm and dry.  Cardiovascular: Regular rate and rhythm. Respiratory:  Clear to auscultation bilateral. Normal respiratory effort Abdomen: gravid, obese, nttp Neuro/Psych:  Normal mood and affect.  Neuro: brachial 1+ b/l  Labs  PC ratio negative  Recent Labs Lab 02/04/15 1231  NA 136  K 4.5  CL 104  CO2 24  BUN 12  CREATININE 0.54  CALCIUM 9.1  PROT 7.0  BILITOT 0.1*  ALKPHOS 73  ALT 11*  AST 15  GLUCOSE 99    Recent Labs Lab 02/04/15 1231  WBC 14.8*  HGB 10.9*  HCT 33.2*  PLT 234    Radiology 10/31: 3015gm, EFW 67%, normal AC and AFI  Perinatal info  B pos/ Rubella  Immune / Varicella Immune/RPR pending/HIV Negative/HepB Surf Ag Negative/TDaP: yes / Flu shot: declined/pap unknown/  Assessment & Plan   29 y.o. G1P0 @ [redacted]w[redacted]d with gestational HTN and currently stable *IUP: rNST. Fetal status reassuring *gHTN: pre-eclampsia and decreased FM precautions given. Pt has wrist cuff and given parameters when to call us/come in for eval. Pt set up  for 36/6 IOL at 8pm on Sunday 11/6. IOL process d/w pt and partner *GBS: pos *Hypothy: continue synthroid qday  Cornelia Copa MD [facility] Pager [phone]

## 2015-02-05 LAB — RPR: RPR Ser Ql: NONREACTIVE

## 2015-02-07 ENCOUNTER — Inpatient Hospital Stay
Admission: RE | Admit: 2015-02-07 | Discharge: 2015-02-14 | DRG: 775 | Disposition: A | Discharge status: Home / Self Care | Payer: Medicaid Other | Source: Ambulatory Visit | Attending: Certified Nurse Midwife | Admitting: Certified Nurse Midwife

## 2015-02-07 DIAGNOSIS — O99213 Obesity complicating pregnancy, third trimester: Secondary | ICD-10-CM | POA: Diagnosis present

## 2015-02-07 DIAGNOSIS — O99210 Obesity complicating pregnancy, unspecified trimester: Secondary | ICD-10-CM | POA: Diagnosis present

## 2015-02-07 DIAGNOSIS — I10 Essential (primary) hypertension: Secondary | ICD-10-CM | POA: Diagnosis present

## 2015-02-07 DIAGNOSIS — O9081 Anemia of the puerperium: Secondary | ICD-10-CM | POA: Diagnosis present

## 2015-02-07 DIAGNOSIS — Z833 Family history of diabetes mellitus: Secondary | ICD-10-CM

## 2015-02-07 DIAGNOSIS — F17200 Nicotine dependence, unspecified, uncomplicated: Secondary | ICD-10-CM | POA: Diagnosis present

## 2015-02-07 DIAGNOSIS — O114 Pre-existing hypertension with pre-eclampsia, complicating childbirth: Principal | ICD-10-CM | POA: Diagnosis present

## 2015-02-07 DIAGNOSIS — E039 Hypothyroidism, unspecified: Secondary | ICD-10-CM | POA: Diagnosis present

## 2015-02-07 DIAGNOSIS — D62 Acute posthemorrhagic anemia: Secondary | ICD-10-CM | POA: Diagnosis present

## 2015-02-07 DIAGNOSIS — O169 Unspecified maternal hypertension, unspecified trimester: Secondary | ICD-10-CM | POA: Diagnosis present

## 2015-02-07 DIAGNOSIS — Z6841 Body Mass Index (BMI) 40.0 and over, adult: Secondary | ICD-10-CM

## 2015-02-07 DIAGNOSIS — O99284 Endocrine, nutritional and metabolic diseases complicating childbirth: Secondary | ICD-10-CM | POA: Diagnosis present

## 2015-02-07 DIAGNOSIS — O99013 Anemia complicating pregnancy, third trimester: Secondary | ICD-10-CM

## 2015-02-07 DIAGNOSIS — Z818 Family history of other mental and behavioral disorders: Secondary | ICD-10-CM

## 2015-02-07 DIAGNOSIS — O99824 Streptococcus B carrier state complicating childbirth: Secondary | ICD-10-CM | POA: Diagnosis present

## 2015-02-07 DIAGNOSIS — Z825 Family history of asthma and other chronic lower respiratory diseases: Secondary | ICD-10-CM

## 2015-02-07 DIAGNOSIS — O99820 Streptococcus B carrier state complicating pregnancy: Secondary | ICD-10-CM | POA: Diagnosis present

## 2015-02-07 DIAGNOSIS — O10919 Unspecified pre-existing hypertension complicating pregnancy, unspecified trimester: Secondary | ICD-10-CM | POA: Diagnosis present

## 2015-02-07 DIAGNOSIS — Z8249 Family history of ischemic heart disease and other diseases of the circulatory system: Secondary | ICD-10-CM

## 2015-02-07 DIAGNOSIS — Z3A37 37 weeks gestation of pregnancy: Secondary | ICD-10-CM

## 2015-02-07 DIAGNOSIS — O0990 Supervision of high risk pregnancy, unspecified, unspecified trimester: Secondary | ICD-10-CM

## 2015-02-07 MED ORDER — OXYTOCIN 10 UNIT/ML IJ SOLN
INTRAMUSCULAR | Status: AC
  Filled 2015-02-07: qty 2

## 2015-02-07 MED ORDER — OXYTOCIN 40 UNITS IN LACTATED RINGERS INFUSION - SIMPLE MED
INTRAVENOUS | Status: AC
  Filled 2015-02-07: qty 1000

## 2015-02-07 MED ORDER — LIDOCAINE HCL (PF) 1 % IJ SOLN
INTRAMUSCULAR | Status: AC
Start: 2015-02-07 — End: 2015-02-08
  Filled 2015-02-07: qty 30

## 2015-02-07 MED ORDER — MISOPROSTOL 200 MCG PO TABS
ORAL_TABLET | ORAL | Status: AC
  Filled 2015-02-07: qty 4

## 2015-02-07 MED ORDER — AMMONIA AROMATIC IN INHA
RESPIRATORY_TRACT | Status: AC
  Filled 2015-02-07: qty 10

## 2015-02-08 ENCOUNTER — Encounter: Payer: Self-pay | Admitting: Obstetrics and Gynecology

## 2015-02-08 DIAGNOSIS — I10 Essential (primary) hypertension: Secondary | ICD-10-CM | POA: Diagnosis present

## 2015-02-08 DIAGNOSIS — E039 Hypothyroidism, unspecified: Secondary | ICD-10-CM | POA: Diagnosis present

## 2015-02-08 DIAGNOSIS — O99824 Streptococcus B carrier state complicating childbirth: Secondary | ICD-10-CM | POA: Diagnosis present

## 2015-02-08 DIAGNOSIS — Z818 Family history of other mental and behavioral disorders: Secondary | ICD-10-CM | POA: Diagnosis not present

## 2015-02-08 DIAGNOSIS — O99013 Anemia complicating pregnancy, third trimester: Secondary | ICD-10-CM

## 2015-02-08 DIAGNOSIS — Z6841 Body Mass Index (BMI) 40.0 and over, adult: Secondary | ICD-10-CM | POA: Diagnosis not present

## 2015-02-08 DIAGNOSIS — Z8249 Family history of ischemic heart disease and other diseases of the circulatory system: Secondary | ICD-10-CM | POA: Diagnosis not present

## 2015-02-08 DIAGNOSIS — F17200 Nicotine dependence, unspecified, uncomplicated: Secondary | ICD-10-CM | POA: Diagnosis present

## 2015-02-08 DIAGNOSIS — Z3A37 37 weeks gestation of pregnancy: Secondary | ICD-10-CM | POA: Diagnosis not present

## 2015-02-08 DIAGNOSIS — Z833 Family history of diabetes mellitus: Secondary | ICD-10-CM | POA: Diagnosis not present

## 2015-02-08 DIAGNOSIS — O0990 Supervision of high risk pregnancy, unspecified, unspecified trimester: Secondary | ICD-10-CM

## 2015-02-08 DIAGNOSIS — O9081 Anemia of the puerperium: Secondary | ICD-10-CM | POA: Diagnosis present

## 2015-02-08 DIAGNOSIS — O10919 Unspecified pre-existing hypertension complicating pregnancy, unspecified trimester: Secondary | ICD-10-CM | POA: Diagnosis present

## 2015-02-08 DIAGNOSIS — O99284 Endocrine, nutritional and metabolic diseases complicating childbirth: Secondary | ICD-10-CM | POA: Diagnosis present

## 2015-02-08 DIAGNOSIS — O99210 Obesity complicating pregnancy, unspecified trimester: Secondary | ICD-10-CM | POA: Diagnosis present

## 2015-02-08 DIAGNOSIS — O114 Pre-existing hypertension with pre-eclampsia, complicating childbirth: Secondary | ICD-10-CM | POA: Diagnosis present

## 2015-02-08 DIAGNOSIS — O99213 Obesity complicating pregnancy, third trimester: Secondary | ICD-10-CM | POA: Diagnosis present

## 2015-02-08 DIAGNOSIS — O99820 Streptococcus B carrier state complicating pregnancy: Secondary | ICD-10-CM | POA: Diagnosis present

## 2015-02-08 DIAGNOSIS — D62 Acute posthemorrhagic anemia: Secondary | ICD-10-CM | POA: Diagnosis present

## 2015-02-08 DIAGNOSIS — Z825 Family history of asthma and other chronic lower respiratory diseases: Secondary | ICD-10-CM | POA: Diagnosis not present

## 2015-02-08 LAB — CBC
HCT: 34.3 % — ABNORMAL LOW (ref 35.0–47.0)
Hemoglobin: 11.4 g/dL — ABNORMAL LOW (ref 12.0–16.0)
MCH: 28.7 pg (ref 26.0–34.0)
MCHC: 33.4 g/dL (ref 32.0–36.0)
MCV: 86 fL (ref 80.0–100.0)
PLATELETS: 244 10*3/uL (ref 150–440)
RBC: 3.98 MIL/uL (ref 3.80–5.20)
RDW: 15.3 % — ABNORMAL HIGH (ref 11.5–14.5)
WBC: 18 10*3/uL — ABNORMAL HIGH (ref 3.6–11.0)

## 2015-02-08 LAB — COMPREHENSIVE METABOLIC PANEL
ALK PHOS: 85 U/L (ref 38–126)
ALT: 13 U/L — ABNORMAL LOW (ref 14–54)
ANION GAP: 7 (ref 5–15)
AST: 18 U/L (ref 15–41)
Albumin: 3.2 g/dL — ABNORMAL LOW (ref 3.5–5.0)
BUN: 14 mg/dL (ref 6–20)
CALCIUM: 9.1 mg/dL (ref 8.9–10.3)
CHLORIDE: 104 mmol/L (ref 101–111)
CO2: 21 mmol/L — AB (ref 22–32)
Creatinine, Ser: 0.55 mg/dL (ref 0.44–1.00)
GFR calc non Af Amer: >60 mL/min (ref >60)
Glucose, Bld: 83 mg/dL (ref 65–99)
Potassium: 3.9 mmol/L (ref 3.5–5.1)
SODIUM: 132 mmol/L — AB (ref 135–145)
Total Bilirubin: 0.7 mg/dL (ref 0.3–1.2)
Total Protein: 7.1 g/dL (ref 6.5–8.1)

## 2015-02-08 LAB — PROTEIN / CREATININE RATIO, URINE
CREATININE, URINE: 83 mg/dL
PROTEIN CREATININE RATIO: 0.13 mg/mg{creat} (ref 0.00–0.15)
TOTAL PROTEIN, URINE: 11 mg/dL

## 2015-02-08 LAB — TYPE AND SCREEN
ABO/RH(D): B POS
Antibody Screen: NEGATIVE

## 2015-02-08 MED ORDER — ACETAMINOPHEN 325 MG PO TABS
650.0000 mg | ORAL_TABLET | Freq: Four times a day (QID) | ORAL | Status: DC | PRN
  Administered 2015-02-08: 650 mg via ORAL
  Filled 2015-02-08 (×2): qty 2

## 2015-02-08 MED ORDER — MISOPROSTOL 25 MCG QUARTER TABLET
25.0000 ug | ORAL_TABLET | ORAL | Status: DC | PRN
  Administered 2015-02-08 – 2015-02-09 (×3): 25 ug via ORAL
  Filled 2015-02-08 (×3): qty 1

## 2015-02-08 MED ORDER — LACTATED RINGERS IV SOLN
INTRAVENOUS | Status: DC
  Administered 2015-02-08: 02:00:00 via INTRAVENOUS
  Administered 2015-02-08: 1000 mL via INTRAVENOUS
  Administered 2015-02-08 – 2015-02-09 (×2): via INTRAVENOUS

## 2015-02-08 MED ORDER — HOME MED STORE IN PYXIS: 1.0000 | Freq: Every day | Status: DC

## 2015-02-08 MED ORDER — ACETAMINOPHEN 325 MG PO TABS
650.0000 mg | ORAL_TABLET | ORAL | Status: DC | PRN
  Administered 2015-02-08 – 2015-02-14 (×5): 650 mg via ORAL
  Filled 2015-02-08 (×4): qty 2

## 2015-02-08 MED ORDER — PENICILLIN G POTASSIUM 5000000 UNITS IJ SOLR
2.5000 10*6.[IU] | INTRAVENOUS | Status: DC
  Administered 2015-02-08 – 2015-02-10 (×12): 2.5 10*6.[IU] via INTRAVENOUS
  Filled 2015-02-08 (×21): qty 2.5

## 2015-02-08 MED ORDER — LEVOTHYROXINE SODIUM 100 MCG PO TABS
100.0000 ug | ORAL_TABLET | Freq: Every day | ORAL | Status: DC
  Administered 2015-02-08 – 2015-02-14 (×6): 100 ug via ORAL
  Filled 2015-02-08 (×9): qty 1

## 2015-02-08 MED ORDER — OXYTOCIN 40 UNITS IN LACTATED RINGERS INFUSION - SIMPLE MED
1.0000 m[IU]/min | INTRAVENOUS | Status: DC
  Administered 2015-02-08 – 2015-02-09 (×2): 1 m[IU]/min via INTRAVENOUS

## 2015-02-08 MED ORDER — OXYTOCIN BOLUS FROM INFUSION: 500.0000 mL | INTRAVENOUS | Status: DC

## 2015-02-08 MED ORDER — ONDANSETRON HCL 4 MG/2ML IJ SOLN: 4.0000 mg | Freq: Four times a day (QID) | INTRAMUSCULAR | Status: DC | PRN

## 2015-02-08 MED ORDER — LABETALOL HCL 5 MG/ML IV SOLN
20.0000 mg | INTRAVENOUS | Status: DC | PRN
  Administered 2015-02-09: 20 mg via INTRAVENOUS
  Filled 2015-02-08: qty 8

## 2015-02-08 MED ORDER — MAGNESIUM SULFATE 50 % IJ SOLN
2.0000 g/h | INTRAVENOUS | Status: DC
  Administered 2015-02-09: 2 g/h via INTRAVENOUS

## 2015-02-08 MED ORDER — OXYTOCIN 40 UNITS IN LACTATED RINGERS INFUSION - SIMPLE MED
62.5000 mL/h | INTRAVENOUS | Status: DC
  Filled 2015-02-08 (×2): qty 1000

## 2015-02-08 MED ORDER — MISOPROSTOL 25 MCG QUARTER TABLET
ORAL_TABLET | ORAL | Status: AC
  Administered 2015-02-08: 25 ug via ORAL
  Filled 2015-02-08: qty 0.25

## 2015-02-08 MED ORDER — HYDRALAZINE HCL 20 MG/ML IJ SOLN: 5.0000 mg | INTRAMUSCULAR | Status: DC | PRN

## 2015-02-08 MED ORDER — LACTATED RINGERS IV SOLN: 500.0000 mL | INTRAVENOUS | Status: DC | PRN

## 2015-02-08 MED ORDER — TERBUTALINE SULFATE 1 MG/ML IJ SOLN: 0.2500 mg | Freq: Once | INTRAMUSCULAR | Status: DC | PRN

## 2015-02-08 MED ORDER — OXYTOCIN 10 UNIT/ML IJ SOLN: 10.0000 [IU] | Freq: Once | INTRAMUSCULAR | Status: DC

## 2015-02-08 MED ORDER — LIDOCAINE HCL (PF) 1 % IJ SOLN: 30.0000 mL | INTRAMUSCULAR | Status: DC | PRN

## 2015-02-08 MED ORDER — MAGNESIUM SULFATE BOLUS VIA INFUSION
4.0000 g | Freq: Once | INTRAVENOUS | Status: AC
  Administered 2015-02-09: 4 g via INTRAVENOUS
  Filled 2015-02-08: qty 500

## 2015-02-08 MED ORDER — CITRIC ACID-SODIUM CITRATE 334-500 MG/5ML PO SOLN: 30.0000 mL | ORAL | Status: DC | PRN

## 2015-02-08 MED ORDER — PENICILLIN G POTASSIUM 5000000 UNITS IJ SOLR
5.0000 10*6.[IU] | Freq: Once | INTRAVENOUS | Status: AC
  Administered 2015-02-08: 5 10*6.[IU] via INTRAVENOUS
  Filled 2015-02-08: qty 5

## 2015-02-08 MED ORDER — DINOPROSTONE 10 MG VA INST
10.0000 mg | VAGINAL_INSERT | Freq: Once | VAGINAL | Status: AC
  Administered 2015-02-08: 10 mg via VAGINAL
  Filled 2015-02-08: qty 1

## 2015-02-08 NOTE — Progress Notes (Signed)
Continues to have headache after second dose of Tylenol.  BP remain mild range.  Will proceed with magnesium sulfate given induction for worsening BP's (CHTN vs GHTN) now with neurologic symptoms.

## 2015-02-08 NOTE — Progress Notes (Signed)
Subjective:  No problems or concerns.  Ate lunch  Objective:   Filed Vitals:   02/08/15 0918 02/08/15 1049 02/08/15 1246 02/08/15 1352  BP: 116/91 135/91 119/89   Pulse: 90 85 114   Temp:   98.2 F (36.8 C) 98.3 F (36.8 C)  TempSrc:   Oral Oral  Resp:   20 18  Height:      Weight:       General: NAD Abdomen: gravid, non-tender Cervical Exam:  Dilation: Closed Effacement (%): 100 Cervical Position: Posterior Station: -3 Exam by:: Eymi Lipuma  FHT: 130, moderate variability, positive accels, no decels Toco: q3-794min  Results for orders placed or performed during the hospital encounter of 02/07/15 (from the past 24 hour(s))  CBC     Status: Abnormal   Collection Time: 02/08/15  1:04 AM  Result Value Ref Range   WBC 18.0 (H) 3.6 - 11.0 K/uL   RBC 3.98 3.80 - 5.20 MIL/uL   Hemoglobin 11.4 (L) 12.0 - 16.0 g/dL   HCT 43.334.3 (L) 29.535.0 - 18.847.0 %   MCV 86.0 80.0 - 100.0 fL   MCH 28.7 26.0 - 34.0 pg   MCHC 33.4 32.0 - 36.0 g/dL   RDW 41.615.3 (H) 60.611.5 - 30.114.5 %   Platelets 244 150 - 440 K/uL  Comprehensive metabolic panel     Status: Abnormal   Collection Time: 02/08/15  1:04 AM  Result Value Ref Range   Sodium 132 (L) 135 - 145 mmol/L   Potassium 3.9 3.5 - 5.1 mmol/L   Chloride 104 101 - 111 mmol/L   CO2 21 (L) 22 - 32 mmol/L   Glucose, Bld 83 65 - 99 mg/dL   BUN 14 6 - 20 mg/dL   Creatinine, Ser 6.010.55 0.44 - 1.00 mg/dL   Calcium 9.1 8.9 - 09.310.3 mg/dL   Total Protein 7.1 6.5 - 8.1 g/dL   Albumin 3.2 (L) 3.5 - 5.0 g/dL   AST 18 15 - 41 U/L   ALT 13 (L) 14 - 54 U/L   Alkaline Phosphatase 85 38 - 126 U/L   Total Bilirubin 0.7 0.3 - 1.2 mg/dL   GFR calc non Af Amer >60 >60 mL/min   GFR calc Af Amer >60 >60 mL/min   Anion gap 7 5 - 15  Protein / creatinine ratio, urine     Status: None   Collection Time: 02/08/15  1:04 AM  Result Value Ref Range   Creatinine, Urine 83 mg/dL   Total Protein, Urine 11 mg/dL   Protein Creatinine Ratio 0.13 0.00 - 0.15 mg/mg[Cre]  Type and screen      Status: None   Collection Time: 02/08/15  2:12 AM  Result Value Ref Range   ABO/RH(D) B POS    Antibody Screen NEG    Sample Expiration 02/11/2015     Assessment:   29 y.o. G1P0 7273w0d IOL for worsening chronic hypertension  Plan:  1) Labor - cervidil removed if contraction continue 3 every 10 minutes pitocin, if space out further cervical ripening with pitocin.  2) Fetus - cat I tracing  3) CHTN - normotensive to mild range BP's

## 2015-02-08 NOTE — H&P (Signed)
Obstetrics Admission History & Physical  02/08/2015 - 12:13 AM Primary OBGYN: [facility]  Chief Complaint: Induction of labor for chronic hypertension  History of Present Illness  29 y.o. G1P0 @ [redacted]w[redacted]d (EDC 11/28), with the above CC. Pregnancy complicated by: BMI 40, hypothyroidism, chronic hypertension, obesity w BMI >40, GBS positive.  [name] is here for induction of labor with worsening blood pressures, mostly in mild range (SBP < 160, DBP < ).  Denies headache, visual disturbances, and RUQ pain.  SHe notes +FM, no LOF, no ctx, and no vaginal bleeding.  Problem list: Patient Active Problem List   Diagnosis Date Noted  . Supervision of high-risk pregnancy 02/08/2015  . Severe obesity (BMI >= 40) (HCC) 02/08/2015  . Hypothyroidism 02/08/2015  . Anemia affecting pregnancy in third trimester 02/08/2015  . Smoker 02/08/2015  . [redacted] weeks gestation of pregnancy 02/08/2015  . Hypertension in pregnancy, antepartum 02/04/2015   Review of Systems: her 12 point review of systems is negative or as noted in the History of Present Illness.  Past Medical History  Diagnosis Date  . Obesity affecting pregnancy   . Hypothyroidism 12/2014  . Anemia 12/2014    Past Surgical History  Procedure Laterality Date  . Fracture surgery Right 29 years old   Medications:  Prescriptions prior to admission  Medication Sig Dispense Refill Last Dose  . aspirin 81 MG chewable tablet Chew 81 mg by mouth daily.   02/08/2015 at Unknown time  . cholecalciferol (VITAMIN D) 1000 UNITS tablet Take 1,000 Units by mouth daily.   02/08/2015 at Unknown time  . ferrous sulfate 325 (65 FE) MG tablet Take 325 mg by mouth.   02/08/2015 at Unknown time  . folic acid (FOLVITE) 800 MCG tablet Take 400 mcg by mouth daily.   Taking  . levothyroxine (SYNTHROID, LEVOTHROID) 100 MCG tablet Take 100 mcg by mouth daily before breakfast.   Taking  . Prenatal Vit-Fe Fumarate-FA (PRENATAL MULTIVITAMIN) TABS tablet Take 1  tablet by mouth daily at 12 noon.   Taking   Allergies: is allergic to latex.  OB History  Gravida Para Term Preterm AB SAB TAB Ectopic Multiple Living  1             # Outcome Date GA Lbr Len/2nd Weight Sex Delivery Anes PTL Lv  1 Current                Family History  Problem Relation Age of Onset  . Depression Mother   . Heart disease Father   . Asthma Brother   . Emphysema Maternal Grandmother   . Diabetes Maternal Grandfather   . Cirrhosis Paternal Grandmother   . Heart disease Paternal Grandfather     Social History   Social History  . Marital Status: Single    Spouse Name: N/A  . Number of Children: N/A  . Years of Education: N/A   Occupational History  . Not on file.   Social History Main Topics  . Smoking status: Former Smoker    Quit date: 07/31/2014  . Smokeless tobacco: Never Used  . Alcohol Use: No  . Drug Use: No  . Sexual Activity: Yes   Other Topics Concern  . Not on file   Social History Narrative    Objective    Current Vital Signs 24h Vital Sign Ranges  T 98.1 F (36.7 C) Temp  Avg: 98.1 F (36.7 C)  Min: 98.1 F (36.7 C)  Max: 98.1 F (36.7 C)  BP (!) 134/101 mmHg BP  Min: 128/90  Max: 141/100  HR 88 Pulse  Avg: 100  Min: 88  Max: 105  RR 2016 Resp  Avg: 20  Min: 20  Max: 20  SaO2    98/RA No Data Recorded       24 Hour I/O Current Shift I/O  Time Ins Outs       General: Well nourished, well developed female in no acute distress.  Skin:  Warm and dry.  Cardiovascular: Regular rate and rhythm. Respiratory:  Clear to auscultation bilateral. Normal respiratory effort Abdomen: gravid, obese, nttp SVE: closed/50/-3 Neuro/Psych:  Normal mood and affect.  Neuro: brachial 1+ b/l  EFM: 135 baseline, +accels, no decels, mod variability  Toco: quiet  Radiology 10/31: 3015gm, EFW 67%, normal AC and AFI, placenta posterior w no previa.  Perinatal info  B pos/ Rubella  Immune / Varicella Immune/RPR pending/HIV Negative/HepB Surf  Ag Negative/TDaP: yes / Flu shot: declined/pap unknown/  Assessment & Plan   29 y.o. G1P0 @ 4313w3d with chronic hypertension and worsening blood pressure here for induction of labor. *IUP: rNST. Fetal status reassuring *Chronic hypertension: Monitor closely. Preeclampsia panel repeat with admission labs.  *GBS: pos - ampicillin *Hypothy: continue 100mcg synthroid qday  Thomasene MohairStephen Conchetta Lamia, MD 02/08/2015 12:18 AM

## 2015-02-08 NOTE — Progress Notes (Signed)
Subjective:  Has developed mild headache 5/10, unrelieved by Tylenol.  BP normotensive to mild range.  No vision changes.  Feel contractions less than on cervidil  Objective:   Filed Vitals:   02/08/15 1655 02/08/15 1829 02/08/15 1830 02/08/15 1909  BP: 134/95 150/103 138/95 131/94  Pulse: 88 91 96 93  Temp:  98.2 F (36.8 C)  98.4 F (36.9 C)  TempSrc:  Oral  Oral  Resp:  18    Height:      Weight:       General: NAD Abdomen: gravid, non-tender Cervical Exam:  Dilation: Closed Effacement (%): 100 Cervical Position: Posterior Station: -3 Exam by:: Yaseen Gilberg  FHT: 130, moderate variability, positive accels, no decels Toco: q4-395min  Results for orders placed or performed during the hospital encounter of 02/07/15 (from the past 24 hour(s))  CBC     Status: Abnormal   Collection Time: 02/08/15  1:04 AM  Result Value Ref Range   WBC 18.0 (H) 3.6 - 11.0 K/uL   RBC 3.98 3.80 - 5.20 MIL/uL   Hemoglobin 11.4 (L) 12.0 - 16.0 g/dL   HCT 16.134.3 (L) 09.635.0 - 04.547.0 %   MCV 86.0 80.0 - 100.0 fL   MCH 28.7 26.0 - 34.0 pg   MCHC 33.4 32.0 - 36.0 g/dL   RDW 40.915.3 (H) 81.111.5 - 91.414.5 %   Platelets 244 150 - 440 K/uL  Comprehensive metabolic panel     Status: Abnormal   Collection Time: 02/08/15  1:04 AM  Result Value Ref Range   Sodium 132 (L) 135 - 145 mmol/L   Potassium 3.9 3.5 - 5.1 mmol/L   Chloride 104 101 - 111 mmol/L   CO2 21 (L) 22 - 32 mmol/L   Glucose, Bld 83 65 - 99 mg/dL   BUN 14 6 - 20 mg/dL   Creatinine, Ser 7.820.55 0.44 - 1.00 mg/dL   Calcium 9.1 8.9 - 95.610.3 mg/dL   Total Protein 7.1 6.5 - 8.1 g/dL   Albumin 3.2 (L) 3.5 - 5.0 g/dL   AST 18 15 - 41 U/L   ALT 13 (L) 14 - 54 U/L   Alkaline Phosphatase 85 38 - 126 U/L   Total Bilirubin 0.7 0.3 - 1.2 mg/dL   GFR calc non Af Amer >60 >60 mL/min   GFR calc Af Amer >60 >60 mL/min   Anion gap 7 5 - 15  Protein / creatinine ratio, urine     Status: None   Collection Time: 02/08/15  1:04 AM  Result Value Ref Range   Creatinine,  Urine 83 mg/dL   Total Protein, Urine 11 mg/dL   Protein Creatinine Ratio 0.13 0.00 - 0.15 mg/mg[Cre]  Type and screen     Status: None   Collection Time: 02/08/15  2:12 AM  Result Value Ref Range   ABO/RH(D) B POS    Antibody Screen NEG    Sample Expiration 02/11/2015     Assessment:   29 y.o. G1P0 282w0d IOL for worsening chronic hypertension  Plan:  1) Labor - stop pitocin and monitor contractions once spaced out switch to cytotec.  2) Fetus - cat I tracing   3) CHTN - normotensive to mild range BP's. Labs normal on admission with negative protein.  Will monitor headache if persists or worsens consider magnesium sulfate

## 2015-02-09 ENCOUNTER — Inpatient Hospital Stay: Payer: Medicaid Other | Admitting: Anesthesiology

## 2015-02-09 ENCOUNTER — Encounter: Payer: Self-pay | Admitting: Anesthesiology

## 2015-02-09 LAB — CBC
HCT: 36.6 % (ref 35.0–47.0)
HEMATOCRIT: 34.8 % — AB (ref 35.0–47.0)
HEMOGLOBIN: 11.5 g/dL — AB (ref 12.0–16.0)
Hemoglobin: 12 g/dL (ref 12.0–16.0)
MCH: 28.6 pg (ref 26.0–34.0)
MCH: 28.5 pg (ref 26.0–34.0)
MCHC: 32.9 g/dL (ref 32.0–36.0)
MCHC: 32.9 g/dL (ref 32.0–36.0)
MCV: 86.9 fL (ref 80.0–100.0)
MCV: 86.5 fL (ref 80.0–100.0)
PLATELETS: 252 10*3/uL (ref 150–440)
Platelets: 248 10*3/uL (ref 150–440)
RBC: 4 MIL/uL (ref 3.80–5.20)
RBC: 4.23 MIL/uL (ref 3.80–5.20)
RDW: 15.4 % — ABNORMAL HIGH (ref 11.5–14.5)
RDW: 15.4 % — AB (ref 11.5–14.5)
WBC: 13.4 10*3/uL — ABNORMAL HIGH (ref 3.6–11.0)
WBC: 17.2 10*3/uL — ABNORMAL HIGH (ref 3.6–11.0)

## 2015-02-09 LAB — COMPREHENSIVE METABOLIC PANEL
ALBUMIN: 3.1 g/dL — AB (ref 3.5–5.0)
ALBUMIN: 3.1 g/dL — AB (ref 3.5–5.0)
ALK PHOS: 87 U/L (ref 38–126)
ALT: 11 U/L — AB (ref 14–54)
ALT: 11 U/L — AB (ref 14–54)
ANION GAP: 7 (ref 5–15)
AST: 18 U/L (ref 15–41)
AST: 16 U/L (ref 15–41)
Alkaline Phosphatase: 96 U/L (ref 38–126)
Anion gap: 10 (ref 5–15)
BUN: 7 mg/dL (ref 6–20)
BUN: 6 mg/dL (ref 6–20)
CALCIUM: 8.3 mg/dL — AB (ref 8.9–10.3)
CHLORIDE: 104 mmol/L (ref 101–111)
CO2: 22 mmol/L (ref 22–32)
CO2: 22 mmol/L (ref 22–32)
CREATININE: 0.6 mg/dL (ref 0.44–1.00)
Calcium: 7.9 mg/dL — ABNORMAL LOW (ref 8.9–10.3)
Chloride: 103 mmol/L (ref 101–111)
Creatinine, Ser: 0.54 mg/dL (ref 0.44–1.00)
GFR calc non Af Amer: >60 mL/min (ref >60)
GFR calc non Af Amer: >60 mL/min (ref >60)
GLUCOSE: 108 mg/dL — AB (ref 65–99)
Glucose, Bld: 94 mg/dL (ref 65–99)
Potassium: 4 mmol/L (ref 3.5–5.1)
Potassium: 4 mmol/L (ref 3.5–5.1)
SODIUM: 135 mmol/L (ref 135–145)
SODIUM: 133 mmol/L — AB (ref 135–145)
TOTAL PROTEIN: 7.2 g/dL (ref 6.5–8.1)
Total Bilirubin: 0.5 mg/dL (ref 0.3–1.2)
Total Bilirubin: 0.8 mg/dL (ref 0.3–1.2)
Total Protein: 7.2 g/dL (ref 6.5–8.1)

## 2015-02-09 LAB — RPR: RPR Ser Ql: NONREACTIVE

## 2015-02-09 MED ORDER — LIDOCAINE HCL (PF) 1 % IJ SOLN
INTRAMUSCULAR | Status: DC | PRN
  Administered 2015-02-09: 1 mL via INTRADERMAL

## 2015-02-09 MED ORDER — LIDOCAINE-EPINEPHRINE (PF) 1.5 %-1:200000 IJ SOLN
INTRAMUSCULAR | Status: DC | PRN
  Administered 2015-02-09: 3 mL via EPIDURAL

## 2015-02-09 MED ORDER — CALCIUM GLUCONATE 10 % IV SOLN
INTRAVENOUS | Status: AC
Start: 2015-02-09 — End: 2015-02-09
  Filled 2015-02-09: qty 10

## 2015-02-09 MED ORDER — GI COCKTAIL ~~LOC~~
30.0000 mL | Freq: Once | ORAL | Status: AC
  Administered 2015-02-09: 30 mL via ORAL
  Filled 2015-02-09 (×2): qty 30

## 2015-02-09 MED ORDER — FENTANYL 2.5 MCG/ML W/ROPIVACAINE 0.2% IN NS 100 ML EPIDURAL INFUSION (ARMC-ANES)
EPIDURAL | Status: AC
  Administered 2015-02-09: 9 mL/h via EPIDURAL
  Filled 2015-02-09: qty 100

## 2015-02-09 MED ORDER — FENTANYL CITRATE (PF) 100 MCG/2ML IJ SOLN
50.0000 ug | INTRAMUSCULAR | Status: DC | PRN
  Administered 2015-02-09 (×2): 50 ug via INTRAVENOUS
  Filled 2015-02-09 (×2): qty 2

## 2015-02-09 MED ORDER — BUPIVACAINE HCL (PF) 0.25 % IJ SOLN
INTRAMUSCULAR | Status: DC | PRN
  Administered 2015-02-09: 5 mL via EPIDURAL

## 2015-02-09 MED ORDER — MISOPROSTOL 25 MCG QUARTER TABLET
ORAL_TABLET | ORAL | Status: AC
Start: 2015-02-09 — End: 2015-02-09
  Administered 2015-02-09: 25 ug via BUCCAL
  Filled 2015-02-09: qty 0.25

## 2015-02-09 MED ORDER — SODIUM CHLORIDE 0.9 % IJ SOLN
INTRAMUSCULAR | Status: AC
  Filled 2015-02-09: qty 3

## 2015-02-09 NOTE — Progress Notes (Signed)
L&D Progress Note  29 year old G1 P0 now 37.1 weeks was admitted for IOL and received Cervidil the first evening, then yesterday was started on Pitocin then that was stopped and she was begun on Cytotec po. She received her third dose of Cytotec at 6AM this morning. She is feeling some contractions, inconsistent in strength, and most not being picked up with toco. Headache better this AM.  Exam: General : was sleeping, appears tired but comfortable Patient Vitals for the past 24 hrs:  BP Temp Temp src Pulse Resp SpO2  02/09/15 0805 (!) 140/99 mmHg 97.6 F (36.4 C) Oral 84 - -  02/09/15 0710 - - - - - 97 %  02/09/15 0705 131/60 mmHg - - 82 - 98 %  02/09/15 0700 - - - - - 99 %  02/09/15 0605 - - - - 20 -  02/09/15 0515 - - - - - 97 %  02/09/15 0510 - - - - - 98 %  02/09/15 0509 133/89 mmHg 98.1 F (36.7 C) Oral 82 18 -  02/09/15 0505 - - - - - 97 %  02/09/15 0405 135/87 mmHg - - 79 16 -  02/09/15 0310 128/81 mmHg - - 81 16 -  02/09/15 0203 (!) 128/96 mmHg - - 82 20 -  02/09/15 0143 136/88 mmHg - - 85 18 -  02/09/15 0112 120/88 mmHg 98.2 F (36.8 C) Oral 94 20 -  02/08/15 2350 (!) 135/94 mmHg - - 79 - -  02/08/15 2348 (!) 99/56 mmHg - - 82 18 -  02/08/15 2325 - - - - 20 -  02/08/15 2111 (!) 138/91 mmHg 98.1 F (36.7 C) Oral 85 18 -  02/08/15 1909 (!) 131/94 mmHg 98.4 F (36.9 C) Oral 93 - -  02/08/15 1830 (!) 138/95 mmHg - - 96 - -  02/08/15 1829 (!) 150/103 mmHg 98.2 F (36.8 C) Oral 91 18 -  02/08/15 1655 (!) 134/95 mmHg - - 88 - -  02/08/15 1528 139/88 mmHg - - 90 - -  02/08/15 1352 (!) 142/93 mmHg 98.3 F (36.8 C) Oral 92 18 -  02/08/15 1246 119/89 mmHg 98.2 F (36.8 C) Oral (!) 114 20 -  02/08/15 1049 (!) 135/91 mmHg - - 85 - -  02/08/15 0918 (!) 116/91 mmHg - - 90 - -   Urine output 1500 ml in 12 hours  FHR 120s with accels to 140s, no decelerations Toco: occasional ctx seen Cervix: 1/50%/-3/ soft on left Cephalic on US (ROT)  A/P IUP at 37.1 weeks with some  progress in cervical ripening  Continue Cytotec q[facility] hours [facility] with superimposed preeclampsia-mild range elevations  Continue magnesium sulfate GBS positive: continue PPX FWB: Cat 1 tracing  Rona Tomson, CNM

## 2015-02-09 NOTE — Plan of Care (Signed)
Problem: Pain Management: Goal: Relief or control of pain from uterine contractions will improve Outcome: Progressing Planning for Epidural    Comments:  Pt updated on POC, breathing through contractions. Cont on mag 2mg  continuous, TFV/hr=16725ml. Voiding well UOP >4230ml/hr. Tylenol given x1 for headache. Will cont to update/monitor.

## 2015-02-09 NOTE — Progress Notes (Signed)
L&D Note  02/09/2015 - 8:19 PM  29 y.o. G15223w1d. Pregnancy complicated by BMI 41, cHTN vs gHTN, hypothyroidism, GBS positive  Debra Bender is admitted for IOL for gHTN vs worsening cHTN. Pt diagnosed with pre-eclampsia with severe features (HA) during hospital stay   Subjective:  Patient sleeping   Objective:   Filed Vitals:   02/09/15 2005 02/09/15 2010 02/09/15 2012 02/09/15 2015  BP:   132/70   Pulse:   86   Temp:      TempSrc:      Resp:      Height:      Weight:      SpO2: 93% 93%  93%    Current Vital Signs 24h Vital Sign Ranges  T 98.5 F (36.9 C) Temp  Avg: 98.1 F (36.7 C)  Min: 97.6 F (36.4 C)  Max: 98.5 F (36.9 C)  BP 132/70 mmHg BP  Min: 99/56  Max: 154/99  HR 86 Pulse  Avg: 85  Min: 79  Max: 94  RR 18 Resp  Avg: 18.4  Min: 16  Max: 20  SaO2 93 %   SpO2  Avg: 96.9 %  Min: 93 %  Max: 100 %       24 Hour I/O Current Shift I/O  Time Ins Outs 11/07 0701 - 11/08 0700 In: 1247.5 [P.O.:120; I.V.:827.5] Out: 1500 [Urine:1500]    UOP: >11400mL/hr   FHR: 130 baseline, +accels, no decels, mod variability Toco: q353m UCs Gen: NAD, pt sleeping 96% on RA in room SVE: deferred. FB came out at 1600 and was 3-4/50/-3 Foley with clear UOP approx 100mL in bag  Recent Labs Lab 02/04/15 1231 02/08/15 0104 02/09/15 1248  WBC 14.8* 18.0* 13.4*  HGB 10.9* 11.4* 11.5*  HCT 33.2* 34.3* 34.8*  PLT 234 244 248    Recent Labs Lab 02/04/15 1231 02/08/15 0104 02/09/15 1248  NA 136 132* 135  K 4.5 3.9 4.0  CL 104 104 103  CO2 24 21* 22  BUN 12 14 7   CREATININE 0.54 0.55 0.60  CALCIUM 9.1 9.1 8.3*  PROT 7.0 7.1 7.2  BILITOT 0.1* 0.7 0.5  ALKPHOS 73 85 87  ALT 11* 13* 11*  AST 15 18 18   GLUCOSE 99 83 108*   PC ratio: negative  Medications Current Facility-Administered Medications  Medication Dose Route Frequency Provider Last Rate Last Dose  . acetaminophen (TYLENOL) tablet 650 mg  650 mg Oral Q4H PRN Vena AustriaAndreas Staebler, MD   650 mg at 02/09/15 1710   . citric acid-sodium citrate (ORACIT) solution 30 mL  30 mL Oral Q2H PRN Conard NovakStephen D Jackson, MD      . fentaNYL (SUBLIMAZE) injection 50 mcg  50 mcg Intravenous Q1H PRN [place] Bingharlie Nedim Oki, MD   50 mcg at 02/09/15 1925  . gi cocktail (Maalox,Lidocaine,Donnatal)  30 mL Oral Once [place] Bingharlie Grayson Pfefferle, MD      . hydrALAZINE (APRESOLINE) injection 5-10 mg  5-10 mg Intravenous Q20 Min PRN Vena AustriaAndreas Staebler, MD      . labetalol (NORMODYNE,TRANDATE) injection 20-40 mg  20-40 mg Intravenous Q10 min PRN Vena AustriaAndreas Staebler, MD      . lactated ringers infusion 500-1,000 mL  500-1,000 mL Intravenous PRN Conard NovakStephen D Jackson, MD      . lactated ringers infusion   Intravenous Continuous Conard NovakStephen D Jackson, MD   Stopped at 02/09/15 1453  . levothyroxine (SYNTHROID, LEVOTHROID) tablet 100 mcg  100 mcg Oral QAC breakfast Vena AustriaAndreas Staebler, MD   100 mcg at 02/09/15  1610  . lidocaine (PF) (XYLOCAINE) 1 % injection 30 mL  30 mL Subcutaneous PRN Conard Novak, MD      . magnesium sulfate 40 g in lactated ringers 500 mL (0.08 g/mL) OB infusion  2 g/hr Intravenous Continuous Vena Austria, MD 25 mL/hr at 02/09/15 1834 2 g/hr at 02/09/15 1834  . ondansetron (ZOFRAN) injection 4 mg  4 mg Intravenous Q6H PRN Conard Novak, MD      . oxytocin (PITOCIN) injection 10 Units  10 Units Intramuscular Once Conard Novak, MD      . oxytocin (PITOCIN) IV BOLUS FROM BAG  500 mL Intravenous Continuous Conard Novak, MD      . oxytocin (PITOCIN) IV infusion 40 units in LR 1000 mL  62.5 mL/hr Intravenous Continuous Conard Novak, MD      . oxytocin (PITOCIN) IV infusion 40 units in LR 1000 mL  1-40 milli-units/min Intravenous Titrated Vena Austria, MD 13.5 mL/hr at 02/09/15 2000 9 milli-units/min at 02/09/15 2000  . penicillin G potassium 2.5 Million Units in dextrose 5 % 100 mL IVPB  2.5 Million Units Intravenous Q4H Conard Novak, MD   2.5 Million Units at 02/09/15 1916  . terbutaline (BRETHINE) injection 0.25 mg  0.25 mg  Subcutaneous Once PRN Conard Novak, MD        Assessment & Plan:  Pt stable *IUP: category I with accels -EFW: 3200gm, cephalic on BSUS earlier today *Pre-eclampsia with severe features (HA): No current s/s. Rpt surveillance labs ordered.  *IOL: started at approx 0130 on 11/7. Continue pit per prtoocl, currently at 9. Consider AROM once more engaged. -s/p cervidil, then pitocin and then PO miso and foley bulb.  *BMI 41: tracing well *Hypothyroidism: continue home synthroid *GBS pos: continue with PCN *Analgesia: no current needs  [place] Bing, Montez Hageman MD [facility] Pager [phone]

## 2015-02-09 NOTE — Progress Notes (Signed)
L&D Note  02/09/2015 - 2:15 PM  29 y.o. G187w1d. Pregnancy complicated by BMI 41, cHTN vs gHTN, hypothyroidism, GBS positive  Debra Bender is admitted for IOL for gHTN vs worsening cHTN. Pt diagnosed with pre-eclampsia with severe features (HA) during hospital stay   Subjective:  No s/s of pre-eclampsia or Mg toxicity. Feels every few minute UCs.    Objective:   Filed Vitals:   02/09/15 1300 02/09/15 1315 02/09/15 1323 02/09/15 1330  BP:   148/94   Pulse:   87   Temp:      TempSrc:      Resp:      Height:      Weight:      SpO2: 100% 100%  100%    Current Vital Signs 24h Vital Sign Ranges  T 98.2 F (36.8 C) Temp  Avg: 98.1 F (36.7 C)  Min: 97.6 F (36.4 C)  Max: 98.4 F (36.9 C)  BP (!) 148/94 mmHg BP  Min: 99/56  Max: 154/102  HR 87 Pulse  Avg: 86  Min: 79  Max: 96  RR 20 Resp  Avg: 18.4  Min: 16  Max: 20  SaO2 100 %   SpO2  Avg: 97.8 %  Min: 95 %  Max: 100 %       24 Hour I/O Current Shift I/O  Time Ins Outs 11/07 0701 - 11/08 0700 In: 1247.5 [P.O.:120; I.V.:827.5] Out: 1500 [Urine:1500] 11/08 0701 - 11/08 1900 In: 1268.3 [P.O.:480; I.V.:688.3] Out: 1825 [Urine:1825]  UOP: >112mL/hr (pt getting up to void)  FHR: 130 baseline, +accels, no decels, mod variability Toco: q3m UCs Gen: NAD RRR no MRGs CTAB 2+ brachial SVE: deferred. FB in place with tugging   Recent Labs Lab 02/04/15 1231 02/08/15 0104 02/09/15 1248  WBC 14.8* 18.0* 13.4*  HGB 10.9* 11.4* 11.5*  HCT 33.2* 34.3* 34.8*  PLT 234 244 248    Recent Labs Lab 02/04/15 1231 02/08/15 0104 02/09/15 1248  NA 136 132* 135  K 4.5 3.9 4.0  CL 104 104 103  CO2 24 21* 22  BUN CREATININE 0.54 0.55 0.60  CALCIUM 9.1 9.1 8.3*  PROT 7.0 7.1 7.2  BILITOT 0.1* 0.7 0.5  ALKPHOS 73 85 87  ALT 11* 13* 11*  AST GLUCOSE 99 83 108*   PC ratio: negative  Medications Current Facility-Administered Medications  Medication Dose Route Frequency Provider Last Rate Last Dose   . acetaminophen (TYLENOL) tablet 650 mg  650 mg Oral Q4H PRN Vena Austria, MD   650 mg at 02/09/15 1013  . citric acid-sodium citrate (ORACIT) solution 30 mL  30 mL Oral Q2H PRN Conard Novak, MD      . hydrALAZINE (APRESOLINE) injection 5-10 mg  5-10 mg Intravenous Q20 Min PRN Vena Austria, MD      . labetalol (NORMODYNE,TRANDATE) injection 20-40 mg  20-40 mg Intravenous Q10 min PRN Vena Austria, MD      . lactated ringers infusion 500-1,000 mL  500-1,000 mL Intravenous PRN Conard Novak, MD      . lactated ringers infusion   Intravenous Continuous Conard Novak, MD 100 mL/hr at 02/09/15 1200    . levothyroxine (SYNTHROID, LEVOTHROID) tablet 100 mcg  100 mcg Oral QAC breakfast Vena Austria, MD   100 mcg at 02/09/15 [phone]  . lidocaine (PF) (XYLOCAINE) 1 % injection 30 mL  30 mL Subcutaneous PRN Conard Novak, MD      .  magnesium sulfate 40 g in lactated ringers 500 mL (0.08 g/mL) OB infusion  2 g/hr Intravenous Continuous Vena AustriaAndreas Staebler, MD 25 mL/hr at 02/09/15 0800 2 g/hr at 02/09/15 0800  . ondansetron (ZOFRAN) injection 4 mg  4 mg Intravenous Q6H PRN Conard NovakStephen D Jackson, MD      . oxytocin (PITOCIN) injection 10 Units  10 Units Intramuscular Once Conard NovakStephen D Jackson, MD      . oxytocin (PITOCIN) IV BOLUS FROM BAG  500 mL Intravenous Continuous Conard NovakStephen D Jackson, MD      . oxytocin (PITOCIN) IV infusion 40 units in LR 1000 mL  62.5 mL/hr Intravenous Continuous Conard NovakStephen D Jackson, MD      . oxytocin (PITOCIN) IV infusion 40 units in LR 1000 mL  1-40 milli-units/min Intravenous Titrated Vena AustriaAndreas Staebler, MD   Stopped at 02/08/15 2031  . penicillin G potassium 2.5 Million Units in dextrose 5 % 100 mL IVPB  2.5 Million Units Intravenous Q4H Conard NovakStephen D Jackson, MD   2.5 Million Units at 02/09/15 1105  . terbutaline (BRETHINE) injection 0.25 mg  0.25 mg Subcutaneous Once PRN Conard NovakStephen D Jackson, MD        Assessment & Plan:  Pt stable *IUP: category I with accels -EFW:  3200gm, cephalic on BSUS earlier today *Pre-eclampsia with severe features (HA): No current s/s. surveillance labs normal. Rpt q8-12hr. Continue Mg and BPs only mild range and never has need meds for BP control.  *IOL: started at approx 0130 on 11/7. FB placed at approx noon with 30mL. Remove in 12hrs and PRN. D/w pt likely starting pitocin again once FB is out -s/p cervidil, then pitocin and then PO miso *BMI 41: tracing well *Hypothyroidism: continue home synthroid *GBS pos: continue with PCN *Analgesia: no current needs  [name], Montez HagemanJr MD [facility] Pager [phone]

## 2015-02-09 NOTE — Progress Notes (Addendum)
L&D Note  02/09/2015 - 9:58 PM  29 y.o. G1 @ 6753w1d. Pregnancy complicated by BMI 41, cHTN vs gHTN, hypothyroidism, GBS positive  Debra Bender is admitted for IOL for gHTN vs worsening cHTN. Pt diagnosed with pre-eclampsia with severe features (HA) during hospital stay   Subjective:  Patient feeling better after GI cocktail    Objective:    Current Vital Signs 24h Vital Sign Ranges  T 98.5 F (36.9 C) Temp  Avg: 98.1 F (36.7 C)  Min: 97.6 F (36.4 C)  Max: 98.5 F (36.9 C)  BP (!) 148/95 mmHg BP  Min: 99/56  Max: 175/94  HR 84 Pulse  Avg: 86.3  Min: 79  Max: 94  RR 18 Resp  Avg: 18.4  Min: 16  Max: 20  SaO2 93 %   SpO2  Avg: 96.6 %  Min: 93 %  Max: 100 %       24 Hour I/O Current Shift I/O  Time Ins Outs 11/07 0701 - 11/08 0700 In: 1247.5 [P.O.:120; I.V.:827.5] Out: 1500 [Urine:1500] 11/08 1901 - 11/09 0700 In: -  Out: 575 [Urine:575]    Patient Vitals for the past 12 hrs:  BP Temp Temp src Pulse Resp SpO2  02/09/15 2125 - - - - - 93 %  02/09/15 2120 - - - - - 93 %  02/09/15 2119 (!) 148/95 mmHg - - 84 - -  02/09/15 2115 - - - - - 94 %  02/09/15 2110 - - - - - 95 %  02/09/15 2100 - - - - - 97 %  02/09/15 2056 (!) 175/94 mmHg - - 93 - -  02/09/15 2055 - - - - - 96 %  02/09/15 2050 - - - - - 97 %  02/09/15 2047 (!) 171/93 mmHg - - 90 - -  02/09/15 2045 (!) 174/95 mmHg - - 88 - 98 %  02/09/15 2044 (!) 156/113 mmHg - - 94 - -  02/09/15 2043 (!) 156/113 mmHg - - 94 - 97 %  02/09/15 2040 - - - - - 97 %  02/09/15 2035 - - - - - 94 %  02/09/15 2030 - - - - - 95 %  02/09/15 2025 - - - - - 95 %  02/09/15 2022 - - - - - 96 %  02/09/15 2020 - - - - - 96 %  02/09/15 2015 - - - - - 93 %  02/09/15 2012 132/70 mmHg - - 86 - -  02/09/15 2010 - - - - - 93 %  02/09/15 2005 - - - - - 93 %  02/09/15 2003 - - - - - 93 %  02/09/15 2000 - - - - - 93 %  02/09/15 1950 - - - - - 97 %  02/09/15 1945 - - - - - 95 %  02/09/15 1937 - - - - - 96 %  02/09/15 1935 - - - - - 96 %   02/09/15 1930 - - - - - 97 %  02/09/15 1915 (!) 154/99 mmHg - - 90 - 95 %  02/09/15 1912 (!) 146/108 mmHg 98.5 F (36.9 C) Oral 93 18 -  02/09/15 1910 - - - - - 96 %  02/09/15 1905 - - - - - 97 %  02/09/15 1900 - - - - - 96 %  02/09/15 1840 - - - - - 95 %  02/09/15 1835 - - - - - 99 %  02/09/15 1831 - - - - -  97 %  02/09/15 1830 - - - - - 97 %  02/09/15 1825 - - - - - 97 %  02/09/15 1820 - - - - - 97 %  02/09/15 1615 (!) 148/86 mmHg - - 80 - 99 %  02/09/15 1445 - - - - - 100 %  02/09/15 1430 - - - - - 98 %  02/09/15 1415 (!) 141/86 mmHg - - 87 - 99 %  02/09/15 1400 - - - - - 100 %  02/09/15 1330 - - - - - 100 %  02/09/15 1323 (!) 148/94 mmHg - - 87 - -  02/09/15 1315 - - - - - 100 %  02/09/15 1300 - - - - - 100 %  02/09/15 1215 (!) 154/102 mmHg 98.2 F (36.8 C) Oral 91 - -  02/09/15 1115 (!) 148/92 mmHg - - 82 - 98 %  02/09/15 1100 - - - - - 98 %  02/09/15 1045 - - - - - 98 %  02/09/15 1030 - - - - - 98 %  02/09/15 1015 - - - - - 97 %  02/09/15 1011 (!) 148/102 mmHg - - 82 - 96 %  02/09/15 1006 - 97.8 F (36.6 C) Oral - - -  02/09/15 1000 - - - - - 95 %    UOP: >145mL/hr   FHR: 135 baseline, +accels, no decels, mod variability Toco: q4-16m UCs Gen: NAD 97 on RA in room SVE: 4/50/-1/barrel shaped cervix   Recent Labs Lab 02/08/15 0104 02/09/15 1248 02/09/15 2050  WBC 18.0* 13.4* 17.2*  HGB 11.4* 11.5* 12.0  HCT 34.3* 34.8* 36.6  PLT 244 248 252    Recent Labs Lab 02/08/15 0104 02/09/15 1248 02/09/15 2050  NA 132* 135 133*  K 3.9 4.0 4.0  CL 104 103 104  CO2 21* 22 22  BUN CREATININE 0.55 0.60 0.54  CALCIUM 9.1 8.3* 7.9*  PROT 7.1 7.2 7.2  BILITOT 0.7 0.5 0.8  ALKPHOS 85 87 96  ALT 13* 11* 11*  AST GLUCOSE 83 108* 94   PC ratio: negative  Medications Current Facility-Administered Medications  Medication Dose Route Frequency Provider Last Rate Last Dose  . acetaminophen (TYLENOL) tablet 650 mg  650 mg Oral Q4H PRN Vena Austria, MD   650 mg at 02/09/15 1710  . citric acid-sodium citrate (ORACIT) solution 30 mL  30 mL Oral Q2H PRN Conard Novak, MD      . fentaNYL (SUBLIMAZE) injection 50 mcg  50 mcg Intravenous Q1H PRN [place] Bing, MD   50 mcg at 02/09/15 1925  . hydrALAZINE (APRESOLINE) injection 5-10 mg  5-10 mg Intravenous Q20 Min PRN Vena Austria, MD      . labetalol (NORMODYNE,TRANDATE) injection 20-40 mg  20-40 mg Intravenous Q10 min PRN Vena Austria, MD   20 mg at 02/09/15 2110  . lactated ringers infusion 500-1,000 mL  500-1,000 mL Intravenous PRN Conard Novak, MD      . lactated ringers infusion   Intravenous Continuous Conard Novak, MD   Stopped at 02/09/15 1453  . levothyroxine (SYNTHROID, LEVOTHROID) tablet 100 mcg  100 mcg Oral QAC breakfast Vena Austria, MD   100 mcg at 02/09/15 [phone]  . lidocaine (PF) (XYLOCAINE) 1 % injection 30 mL  30 mL Subcutaneous PRN Conard Novak, MD      . magnesium sulfate 40 g in lactated ringers 500 mL (0.08 g/mL) OB infusion  2 g/hr Intravenous Continuous Vena Austria, MD 25 mL/hr at 02/09/15 1834 2 g/hr at 02/09/15 1834  . ondansetron (ZOFRAN) injection 4 mg  4 mg Intravenous Q6H PRN Conard Novak, MD      . oxytocin (PITOCIN) injection 10 Units  10 Units Intramuscular Once Conard Novak, MD      . oxytocin (PITOCIN) IV BOLUS FROM BAG  500 mL Intravenous Continuous Conard Novak, MD      . oxytocin (PITOCIN) IV infusion 40 units in LR 1000 mL  62.5 mL/hr Intravenous Continuous Conard Novak, MD      . oxytocin (PITOCIN) IV infusion 40 units in LR 1000 mL  1-40 milli-units/min Intravenous Titrated Vena [place], MD 19.5 mL/hr at 02/09/15 2100 13 milli-units/min at 02/09/15 2100  . penicillin G potassium 2.5 Million Units in dextrose 5 % 100 mL IVPB  2.5 Million Units Intravenous Q4H Conard Novak, MD   2.5 Million Units at 02/09/15 1916  . sodium chloride 0.9 % injection           . terbutaline (BRETHINE)  injection 0.25 mg  0.25 mg Subcutaneous Once PRN Conard Novak, MD        Assessment & Plan:  Pt stable *IUP: category I with accels -EFW: 3200gm, cephalic on BSUS earlier today *Pre-eclampsia with severe features (HA): Patient had some severe range BPs that responded to Labetalol  IV x 1. Will continue to follow. No current s/s.   *IOL: started at approx 0130 on 11/7. Continue pit per prtoocl, currently at 15. Consider AROM once more engaged. -s/p cervidil, then pitocin and then PO miso and foley bulb.  *BMI 41: tracing well *Hypothyroidism: continue home synthroid *GBS pos: continue with PCN *Analgesia: can have epidural if pt desires  Cornelia Copa MD [facility] Pager [phone]

## 2015-02-09 NOTE — Progress Notes (Signed)
Complaining of chest tightness and sensation of racing heart rate. Lungs clear and heart rate unchanged. Dr. Vergie LivingPickens notified, continue to monitor.

## 2015-02-09 NOTE — Anesthesia Procedure Notes (Signed)
Epidural Patient location during procedure: OB Start time: 02/09/2015 10:40 PM End time: 02/09/2015 10:46 PM  Staffing Anesthesiologist: Margorie JohnPISCITELLO, Jordain Radin K Performed by: anesthesiologist   Preanesthetic Checklist Completed: patient identified, site marked, surgical consent, pre-op evaluation, timeout performed, IV checked, risks and benefits discussed and monitors and equipment checked  Epidural Patient position: sitting Prep: ChloraPrep Patient monitoring: heart rate, continuous pulse ox and blood pressure Approach: midline Location: L3-L4 Injection technique: LOR saline  Needle:  Needle type: Tuohy  Needle gauge: 17 G Needle length: 9 cm and 9 Catheter type: closed end flexible Catheter size: 19 Gauge Catheter at skin depth: 15 cm Test dose: negative and 1.5% lidocaine with Epi 1:200 K  Assessment Sensory level: T8 Events: blood not aspirated, injection not painful, no injection resistance, negative IV test and no paresthesia  Additional Notes   Patient tolerated the insertion well without complications.Reason for block:procedure for pain

## 2015-02-09 NOTE — Consult Note (Signed)
Dr Vergie LivingPickens informed of pt continued c/o chest tightness, reviewed latest BPs, Pt continues to c/o of H/A and fentanyl given for headache and abd pain. Orders received. MD plans for repeat labs and will come assess pt and plans for AROM

## 2015-02-09 NOTE — Anesthesia Preprocedure Evaluation (Signed)
Anesthesia Evaluation  Patient identified by MRN, date of birth, ID band Patient awake    Reviewed: Allergy & Precautions, H&P , NPO status , Patient's Chart, lab work & pertinent test results  History of Anesthesia Complications Negative for: history of anesthetic complications  Airway Mallampati: III  TM Distance: >3 FB Neck ROM: full    Dental no notable dental hx. (+) Teeth Intact   Pulmonary former smoker,    Pulmonary exam normal breath sounds clear to auscultation       Cardiovascular Exercise Tolerance: Good hypertension, Normal cardiovascular exam Rhythm:regular Rate:Normal     Neuro/Psych negative neurological ROS  negative psych ROS   GI/Hepatic negative GI ROS, Neg liver ROS,   Endo/Other  Hypothyroidism Morbid obesity  Renal/GU negative Renal ROS  negative genitourinary   Musculoskeletal   Abdominal   Peds  Hematology negative hematology ROS (+)   Anesthesia Other Findings Past Medical History:   Obesity affecting pregnancy                                  Hypothyroidism                                  12/2014       Anemia                                          12/2014      Past Surgical History:   FRACTURE SURGERY                                Right 8 years o*  BMI    Body Mass Index   46.32 kg/m 2      Reproductive/Obstetrics (+) Pregnancy                             Anesthesia Physical Anesthesia Plan  ASA: III  Anesthesia Plan: Epidural   Post-op Pain Management:    Induction:   Airway Management Planned:   Additional Equipment:   Intra-op Plan:   Post-operative Plan:   Informed Consent: I have reviewed the patients History and Physical, chart, labs and discussed the procedure including the risks, benefits and alternatives for the proposed anesthesia with the patient or authorized representative who has indicated his/her understanding and  acceptance.   Dental Advisory Given  Plan Discussed with: Anesthesiologist, CRNA and Surgeon  Anesthesia Plan Comments:         Anesthesia Quick Evaluation

## 2015-02-09 NOTE — Progress Notes (Signed)
Dr Bonney AidStaebler viewing FHR monitor strip at nurses station. Aware pt unaware of uterine activity. Dr Bonney AidStaebler stating to try and keep IV hourly intake total to equal 125 cc/h, and that indwelling foley catheter not necessary presently.

## 2015-02-10 ENCOUNTER — Encounter: Payer: Self-pay | Admitting: *Deleted

## 2015-02-10 DIAGNOSIS — O1410 Severe pre-eclampsia, unspecified trimester: Secondary | ICD-10-CM

## 2015-02-10 LAB — CBC
HEMATOCRIT: 35.9 % (ref 35.0–47.0)
HEMOGLOBIN: 11.6 g/dL — AB (ref 12.0–16.0)
MCH: 28.2 pg (ref 26.0–34.0)
MCHC: 32.2 g/dL (ref 32.0–36.0)
MCV: 87.5 fL (ref 80.0–100.0)
Platelets: 261 10*3/uL (ref 150–440)
RBC: 4.1 MIL/uL (ref 3.80–5.20)
RDW: 15.4 % — ABNORMAL HIGH (ref 11.5–14.5)
WBC: 18.8 10*3/uL — ABNORMAL HIGH (ref 3.6–11.0)

## 2015-02-10 LAB — COMPREHENSIVE METABOLIC PANEL
ALT: 10 U/L — AB (ref 14–54)
ANION GAP: 8 (ref 5–15)
AST: 21 U/L (ref 15–41)
Albumin: 2.9 g/dL — ABNORMAL LOW (ref 3.5–5.0)
Alkaline Phosphatase: 85 U/L (ref 38–126)
BUN: 6 mg/dL (ref 6–20)
CHLORIDE: 103 mmol/L (ref 101–111)
CO2: 24 mmol/L (ref 22–32)
Calcium: 8 mg/dL — ABNORMAL LOW (ref 8.9–10.3)
Creatinine, Ser: 0.68 mg/dL (ref 0.44–1.00)
GFR calc non Af Amer: >60 mL/min (ref >60)
Glucose, Bld: 104 mg/dL — ABNORMAL HIGH (ref 65–99)
POTASSIUM: 4.3 mmol/L (ref 3.5–5.1)
SODIUM: 135 mmol/L (ref 135–145)
Total Bilirubin: 0.6 mg/dL (ref 0.3–1.2)
Total Protein: 7 g/dL (ref 6.5–8.1)

## 2015-02-10 MED ORDER — SIMETHICONE 80 MG PO CHEW: 80.0000 mg | CHEWABLE_TABLET | ORAL | Status: DC | PRN

## 2015-02-10 MED ORDER — OXYTOCIN 40 UNITS IN LACTATED RINGERS INFUSION - SIMPLE MED
INTRAVENOUS | Status: AC
  Filled 2015-02-10: qty 1000

## 2015-02-10 MED ORDER — OXYTOCIN 10 UNIT/ML IJ SOLN
INTRAMUSCULAR | Status: AC
  Filled 2015-02-10: qty 2

## 2015-02-10 MED ORDER — NALBUPHINE HCL 10 MG/ML IJ SOLN
5.0000 mg | Freq: Once | INTRAMUSCULAR | Status: DC | PRN
  Filled 2015-02-10: qty 0.5

## 2015-02-10 MED ORDER — NALBUPHINE HCL 10 MG/ML IJ SOLN: 5.0000 mg | INTRAMUSCULAR | Status: DC | PRN

## 2015-02-10 MED ORDER — AMMONIA AROMATIC IN INHA
RESPIRATORY_TRACT | Status: AC
  Filled 2015-02-10: qty 10

## 2015-02-10 MED ORDER — IBUPROFEN 600 MG PO TABS
600.0000 mg | ORAL_TABLET | Freq: Four times a day (QID) | ORAL | Status: DC
  Administered 2015-02-10 – 2015-02-14 (×16): 600 mg via ORAL
  Filled 2015-02-10 (×16): qty 1

## 2015-02-10 MED ORDER — LIDOCAINE HCL (PF) 1 % IJ SOLN
INTRAMUSCULAR | Status: AC
  Filled 2015-02-10: qty 30

## 2015-02-10 MED ORDER — FENTANYL 2.5 MCG/ML W/ROPIVACAINE 0.2% IN NS 100 ML EPIDURAL INFUSION (ARMC-ANES)
9.0000 mL/h | EPIDURAL | Status: DC
  Administered 2015-02-09: 9 mL/h via EPIDURAL
  Filled 2015-02-10: qty 100

## 2015-02-10 MED ORDER — OXYTOCIN 40 UNITS IN LACTATED RINGERS INFUSION - SIMPLE MED
62.5000 mL/h | INTRAVENOUS | Status: DC | PRN
  Administered 2015-02-10: 62.5 mL/h via INTRAVENOUS

## 2015-02-10 MED ORDER — MISOPROSTOL 200 MCG PO TABS
ORAL_TABLET | ORAL | Status: AC
  Filled 2015-02-10: qty 4

## 2015-02-10 MED ORDER — MISOPROSTOL 200 MCG PO TABS
ORAL_TABLET | ORAL | Status: AC
  Administered 2015-02-10: 800 ug
  Filled 2015-02-10: qty 4

## 2015-02-10 MED ORDER — DOCUSATE SODIUM 100 MG PO CAPS
100.0000 mg | ORAL_CAPSULE | Freq: Two times a day (BID) | ORAL | Status: DC
  Administered 2015-02-10 – 2015-02-14 (×5): 100 mg via ORAL
  Filled 2015-02-10 (×7): qty 1

## 2015-02-10 MED ORDER — HYDROCODONE-ACETAMINOPHEN 5-325 MG PO TABS
ORAL_TABLET | ORAL | Status: AC
  Administered 2015-02-10: 2 via ORAL
  Filled 2015-02-10: qty 2

## 2015-02-10 MED ORDER — LANOLIN HYDROUS EX OINT: TOPICAL_OINTMENT | CUTANEOUS | Status: DC | PRN

## 2015-02-10 MED ORDER — DIPHENHYDRAMINE HCL 50 MG/ML IJ SOLN: 12.5000 mg | INTRAMUSCULAR | Status: DC | PRN

## 2015-02-10 MED ORDER — ONDANSETRON HCL 4 MG PO TABS: 4.0000 mg | ORAL_TABLET | ORAL | Status: DC | PRN

## 2015-02-10 MED ORDER — DEXTROSE 5 % IV SOLN: 1.0000 ug/kg/h | INTRAVENOUS | Status: DC | PRN

## 2015-02-10 MED ORDER — SODIUM CHLORIDE 0.9 % IJ SOLN
INTRAMUSCULAR | Status: AC
  Filled 2015-02-10: qty 10

## 2015-02-10 MED ORDER — DIPHENHYDRAMINE HCL 25 MG PO CAPS: 25.0000 mg | ORAL_CAPSULE | ORAL | Status: DC | PRN

## 2015-02-10 MED ORDER — BENZOCAINE-MENTHOL 20-0.5 % EX AERO
1.0000 "application " | INHALATION_SPRAY | CUTANEOUS | Status: DC | PRN
  Filled 2015-02-10: qty 56

## 2015-02-10 MED ORDER — DIBUCAINE 1 % RE OINT: 1.0000 "application " | TOPICAL_OINTMENT | RECTAL | Status: DC | PRN

## 2015-02-10 MED ORDER — WITCH HAZEL-GLYCERIN EX PADS
1.0000 "application " | MEDICATED_PAD | CUTANEOUS | Status: DC | PRN
  Filled 2015-02-10: qty 100

## 2015-02-10 MED ORDER — ONDANSETRON HCL 4 MG/2ML IJ SOLN
4.0000 mg | Freq: Three times a day (TID) | INTRAMUSCULAR | Status: DC | PRN
Start: 2015-02-10 — End: 2015-02-10

## 2015-02-10 MED ORDER — ONDANSETRON HCL 4 MG/2ML IJ SOLN: 4.0000 mg | INTRAMUSCULAR | Status: DC | PRN

## 2015-02-10 MED ORDER — HYDROCODONE-ACETAMINOPHEN 5-325 MG PO TABS
1.0000 | ORAL_TABLET | Freq: Four times a day (QID) | ORAL | Status: DC | PRN
  Administered 2015-02-10: 2 via ORAL

## 2015-02-10 MED ORDER — NALOXONE HCL 0.4 MG/ML IJ SOLN: 0.4000 mg | INTRAMUSCULAR | Status: DC | PRN

## 2015-02-10 MED ORDER — PRENATAL MULTIVITAMIN CH
1.0000 | ORAL_TABLET | Freq: Every day | ORAL | Status: DC
  Administered 2015-02-12 – 2015-02-14 (×3): 1 via ORAL
  Filled 2015-02-10 (×3): qty 1

## 2015-02-10 MED ORDER — SODIUM CHLORIDE 0.9 % IJ SOLN: 3.0000 mL | INTRAMUSCULAR | Status: DC | PRN

## 2015-02-10 MED ORDER — FERROUS SULFATE 325 (65 FE) MG PO TABS
325.0000 mg | ORAL_TABLET | Freq: Every day | ORAL | Status: DC
  Administered 2015-02-11 – 2015-02-14 (×4): 325 mg via ORAL
  Filled 2015-02-10 (×4): qty 1

## 2015-02-10 MED ORDER — LACTATED RINGERS IV SOLN: INTRAVENOUS | Status: DC

## 2015-02-10 NOTE — Progress Notes (Signed)
L&D Progress Note  S: Having pain in LLQ. Was better after an epidural bolus about 2 hours ago, now hurting again. O: General : moaning with pain BP 129/79 mmHg  Pulse 89  Temp(Src) 98.5 F (36.9 C) (Oral)  Resp 18  Ht 5' 4.5" (1.638 m)  Wt 274 lb (124.286 kg)  BMI 46.32 kg/m2  SpO2 94%  LMP 05/25/2014 (Exact Date) Current BP 141/91  BSUS; Baby is OP Cervix: ant lip/0 station FHR 130s with accels to 150s to 160s, moderate variability Contractions q2-4 min, with a run of three contractions on 20 miu/min Pitocin. Pitocin decreased to 15 miu/min  A/P: Approaching start of second stage, baby still high and ant lip remaining Will see if anesthesia can get her more comfortable before she starts pushing and place on left side with peanut to facilitate baby turning FWB-Cat 1 fetal heart tracing Preeclampsia: Blood pressures in normal to mild range and repeat PIH labs this Am WNL.  Farrel ConnersGUTIERREZ, Fajr [place], CNM

## 2015-02-10 NOTE — Discharge Summary (Addendum)
Physician Obstetric Discharge Summary  Patient ID: Debra Bender MRN: [id number] DOB/AGE: December 03, 1985 29 y.o.   Date of Admission: 02/07/2015 Date of Discharge:  02/14/2015  Admitting Diagnosis: Induction of labor at [redacted]w[redacted]d  Secondary Diagnosis: Chronic hypertension with superimposed preeclampsia/ preeclampsia with severe features (headache)/ Obesity / Hypothyroidism  Mode of Delivery: normal spontaneous vaginal delivery     Discharge Diagnosis: No other diagnosis   Intrapartum Procedures: Atificial rupture of membranes, epidural, GBS prophylaxis, laceration (second degree perineal) repaired, pitocin augmentation, placement of intrauterine catheter and Cervidil ripening and Cytotec induction and intracervical foley bulb placement, magnesium sulfate seizure prophyllaxis   Post partum procedures: magnesium sulfate prophyllaxis  Complications: second degree perineal laceration and uterine atony   Brief Hospital Course  Debra Bender is a G1P1001 who had a SVD on 02/10/2015 after a 3 day induction for chronic hypertension with superimposed preeclampsia. She received Cervidil, then Cytotec and Pitocin as well as a intracervical foley bulb.  She was placed on magnesium sulfate prophyllaxis and never needed antihypertensive medications as her blood pressures remained in the mild range. Please see the delivery note for further details of her delivery. Patient had a postpartum course complicated by mag sulfate x 24 hours and mild range pressures. By time of discharge on PPD#4, her pain was controlled on oral pain medications; she had appropriate lochia and was ambulating, voiding without difficulty and tolerating regular diet.  Her blood pressures remained normal -to -mild range after initiation of anti-hypertensives.  She was deemed stable for discharge to home.     Labs: CBC Latest Ref Rng 02/11/2015 02/10/2015 02/09/2015  WBC 3.6 - 11.0 K/uL 16.2(H) 18.8(H) 17.2(H)  Hemoglobin 12.0 - 16.0  g/dL 10.3(L) 11.6(L) 12.0  Hematocrit 35.0 - 47.0 % 31.7(L) 35.9 36.6  Platelets 150 - 440 K/uL 224 261 252   B POS// RI//VI//TDAP UTD  Physical exam:  Blood pressure 142/94, pulse 92, temperature 98.1 F (36.7 Bender), temperature source Oral, resp. rate 18, height 5' 4.5" (1.638 m), weight 124.286 kg (274 lb), last menstrual period 05/25/2014, SpO2 98 %, unknown if currently breastfeeding. General: alert and no distress Lochia: appropriate Abdomen: soft, NT Uterine Fundus: firm Extremities: No evidence of DVT seen on physical exam. No lower extremity edema.  Discharge Instructions: Per After Visit Summary. Activity: Advance as tolerated. Pelvic rest for 6 weeks.  Also refer to Discharge Instructions Diet: Regular Medications:   Medication List    STOP taking these medications        aspirin 81 MG chewable tablet      TAKE these medications        acetaminophen 325 MG tablet  Commonly known as:  TYLENOL  Take 2 tablets (650 mg total) by mouth every 4 (four) hours as needed for mild pain or headache.     cholecalciferol 1000 UNITS tablet  Commonly known as:  VITAMIN D  Take 1,000 Units by mouth daily.     ferrous sulfate 325 (65 FE) MG tablet  Take 325 mg by mouth.     folic acid 800 MCG tablet  Commonly known as:  FOLVITE  Take 400 mcg by mouth daily.     ibuprofen 600 MG tablet  Commonly known as:  ADVIL,MOTRIN  Take 1 tablet (600 mg total) by mouth every 6 (six) hours.     labetalol 200 MG tablet  Commonly known as:  NORMODYNE  Take 1 tablet (200 mg total) by mouth 2 (two) times daily.     levothyroxine 100  MCG tablet  Commonly known as:  SYNTHROID, LEVOTHROID  Take 100 mcg by mouth daily before breakfast.     NIFEdipine 30 MG 24 hr tablet  Commonly known as:  PROCARDIA-XL/ADALAT CC  Take 1 tablet (30 mg total) by mouth daily.     prenatal multivitamin Tabs tablet  Take 1 tablet by mouth daily at 12 noon.       Outpatient follow up:  Follow-up  Information    Follow up with GUTIERREZ, COLLEEN, CNM In 3 days.   Specialty:  Certified Nurse Midwife   Why:  for blood pressure check   Contact information:   [address] [place] [address] [phone]       Follow up with GUTIERREZ, COLLEEN, CNM In 6 weeks.   Specialty:  Certified Nurse Midwife   Contact information:   [address] [place] [address] [phone]      Postpartum contraception: TBD  Discharged Condition: stable   Discharged to: home   Newborn Data: Debra Bender/ 6#12 oz Disposition:home with mother  Apgars: APGAR (1 MIN): 8   APGAR (5 MINS): 9   APGAR (10 MINS):    Baby Feeding: both breast and bottle  Debra Bender, Debra Fenderhelsea C, MD 02/14/2015 4:49 PM

## 2015-02-10 NOTE — Progress Notes (Signed)
L&D Note  02/10/2015 - 1:48 AM  29 y.o. G1 @ 6576w2d. Pregnancy complicated by BMI 41, cHTN vs gHTN, hypothyroidism, GBS positive  Debra Bender is admitted for IOL for gHTN vs worsening cHTN. Pt diagnosed with pre-eclampsia with severe features (HA) during hospital stay   Subjective:  No s/s of pre-eclampsia. Not feeling UCs with epidural in place   Objective:    Current Vital Signs 24h Vital Sign Ranges  T 98.5 F (36.9 C) Temp  Avg: 98 F (36.7 C)  Min: 97.6 F (36.4 C)  Max: 98.5 F (36.9 C)  BP 111/65 mmHg BP  Min: 111/65  Max: 175/94  HR 81 Pulse  Avg: 89.4  Min: 78  Max: 194  RR 18 Resp  Avg: 18  Min: 16  Max: 20  SaO2 98 %  /RA SpO2  Avg: 96.2 %  Min: 92 %  Max: 100 %       24 Hour I/O Current Shift I/O  Time Ins Outs 11/08 0701 - 11/09 0700 In: 1668.3 [P.O.:480; I.V.:1088.3] Out: 4275 [Urine:4275] 11/08 1901 - 11/09 0700 In: -  Out: 1125 [Urine:1125]    Patient Vitals for the past 12 hrs:  BP Temp Temp src Pulse Resp SpO2  02/10/15 0135 - - - - - 98 %  02/10/15 0130 - - - - - 92 %  02/10/15 0125 - - - - - 93 %  02/10/15 0120 - - - - - 93 %  02/10/15 0115 - - - - - 94 %  02/10/15 0110 - - - - - 95 %  02/10/15 0105 - - - - - 95 %  02/10/15 0100 - - - - - 96 %  02/10/15 0055 - - - - - 95 %  02/10/15 0050 - - - - - 96 %  02/10/15 0045 - - - - - 97 %  02/10/15 0044 111/65 mmHg - - 81 - -  02/10/15 0040 - - - - - 95 %  02/10/15 0035 - - - - - 96 %  02/10/15 0030 - - - - - 95 %  02/10/15 0025 - - - - - 96 %  02/10/15 0020 - - - - - 96 %  02/10/15 0015 - - - - - 98 %  02/10/15 0010 - - - - - 97 %  02/10/15 0005 - - - - - 98 %  02/10/15 0000 - - - - - 99 %  02/09/15 2355 - - - - - 99 %  02/09/15 2350 - - - - - 96 %  02/09/15 2345 121/73 mmHg - - (!) 194 - 98 %  02/09/15 2340 - - - - - 97 %  02/09/15 2335 - - - - - 96 %  02/09/15 2330 - - - - - 95 %  02/09/15 2329 (!) 136/94 mmHg - - 78 - -  02/09/15 2325 - - - - - 95 %  02/09/15 2320 - - - - - 96 %   02/09/15 2310 - - - - - 94 %  02/09/15 2309 (!) 143/88 mmHg - - 86 - -  02/09/15 2305 - - - - - 95 %  02/09/15 2304 (!) 143/87 mmHg - - 85 - -  02/09/15 2300 - - - - - 95 %  02/09/15 2258 (!) 151/95 mmHg - - 87 - -  02/09/15 2255 - - - - - 97 %  02/09/15 2254 (!) 153/131 mmHg - -  82 - -  02/09/15 2252 (!) 152/95 mmHg - - 89 - -  02/09/15 2250 - - - - - 97 %  02/09/15 2245 - - - - - 97 %  02/09/15 2240 - - - - - 98 %  02/09/15 2235 - - - - - 98 %  02/09/15 2230 - - - - - 98 %  02/09/15 2225 - - - - - 98 %  02/09/15 2220 - - - - - 97 %  02/09/15 2215 - - - - - 95 %  02/09/15 2212 (!) 158/106 mmHg - - 89 - -  02/09/15 2210 - - - - - 95 %  02/09/15 2205 - - - - - 94 %  02/09/15 2200 - - - - - 97 %  02/09/15 2155 - - - - - 96 %  02/09/15 2150 (!) 157/96 mmHg - - 92 - 94 %  02/09/15 2145 - - - - - 93 %  02/09/15 2140 - - - - - 93 %  02/09/15 2135 - - - - - 95 %  02/09/15 2130 (!) 152/100 mmHg - - 87 - 94 %  02/09/15 2125 - - - - - 93 %  02/09/15 2120 - - - - - 93 %  02/09/15 2119 (!) 148/95 mmHg - - 84 - -  02/09/15 2115 - - - - - 94 %  02/09/15 2110 - - - - - 95 %  02/09/15 2100 - - - - - 97 %  02/09/15 2056 (!) 175/94 mmHg - - 93 - -  02/09/15 2055 - - - - - 96 %  02/09/15 2050 - - - - - 97 %  02/09/15 2047 (!) 171/93 mmHg - - 90 - -  02/09/15 2045 (!) 174/95 mmHg - - 88 - 98 %  02/09/15 2044 (!) 156/113 mmHg - - 94 - -  02/09/15 2043 (!) 156/113 mmHg - - 94 - 97 %  02/09/15 2040 - - - - - 97 %  02/09/15 2035 - - - - - 94 %  02/09/15 2030 - - - - - 95 %  02/09/15 2025 - - - - - 95 %  02/09/15 2022 - - - - - 96 %  02/09/15 2020 - - - - - 96 %  02/09/15 2015 - - - - - 93 %  02/09/15 2012 132/70 mmHg - - 86 - -  02/09/15 2010 - - - - - 93 %  02/09/15 2005 - - - - - 93 %  02/09/15 2003 - - - - - 93 %  02/09/15 2000 - - - - - 93 %  02/09/15 1950 - - - - - 97 %  02/09/15 1945 - - - - - 95 %  02/09/15 1937 - - - - - 96 %  02/09/15 1935 - - - - - 96 %  02/09/15 1930 - -  - - - 97 %  02/09/15 1915 (!) 154/99 mmHg - - 90 - 95 %  02/09/15 1912 (!) 146/108 mmHg 98.5 F (36.9 C) Oral 93 18 -  02/09/15 1910 - - - - - 96 %  02/09/15 1905 - - - - - 97 %  02/09/15 1900 - - - - - 96 %  02/09/15 1840 - - - - - 95 %  02/09/15 1835 - - - - - 99 %  02/09/15 1831 - - - - - 97 %  02/09/15 1830 - - - - -  97 %  02/09/15 1825 - - - - - 97 %  02/09/15 1820 - - - - - 97 %  02/09/15 1615 (!) 148/86 mmHg - - 80 - 99 %  02/09/15 1445 - - - - - 100 %  02/09/15 1430 - - - - - 98 %  02/09/15 1415 (!) 141/86 mmHg - - 87 - 99 %  02/09/15 1400 - - - - - 100 %    UOP: >170mL/hr   FHR: 130 baseline, +accels, ?a few subtle lates that have resolved, mod variability Toco: difficult to trace, ?q3-42m Gen: NAD SVE:3-4/50/-1 still barrel shaped-->AROM, clear fluid and IUPC placed GU: foley in place with clear UOP in tube/bag  Recent Labs Lab 02/08/15 0104 02/09/15 1248 02/09/15 2050  WBC 18.0* 13.4* 17.2*  HGB 11.4* 11.5* 12.0  HCT 34.3* 34.8* 36.6  PLT 244 248 252    Recent Labs Lab 02/08/15 0104 02/09/15 1248 02/09/15 2050  NA 132* 135 133*  K 3.9 4.0 4.0  CL 104 103 104  CO2 21* 22 22  BUN CREATININE 0.55 0.60 0.54  CALCIUM 9.1 8.3* 7.9*  PROT 7.1 7.2 7.2  BILITOT 0.7 0.5 0.8  ALKPHOS 85 87 96  ALT 13* 11* 11*  AST GLUCOSE 83 108* 94   PC ratio: negative  Medications Current Facility-Administered Medications  Medication Dose Route Frequency Provider Last Rate Last Dose  . acetaminophen (TYLENOL) tablet 650 mg  650 mg Oral Q4H PRN Vena Austria, MD   650 mg at 02/10/15 0012  . citric acid-sodium citrate (ORACIT) solution 30 mL  30 mL Oral Q2H PRN Conard Novak, MD      . fentaNYL (SUBLIMAZE) injection 50 mcg  50 mcg Intravenous Q1H PRN Altamont Bing, MD   50 mcg at 02/09/15 1925  . hydrALAZINE (APRESOLINE) injection 5-10 mg  5-10 mg Intravenous Q20 Min PRN Vena Austria, MD      . labetalol (NORMODYNE,TRANDATE)  injection 20-40 mg  20-40 mg Intravenous Q10 min PRN Vena Austria, MD   20 mg at 02/09/15 2110  . lactated ringers infusion 500-1,000 mL  500-1,000 mL Intravenous PRN Conard Novak, MD      . lactated ringers infusion   Intravenous Continuous Conard Novak, MD   Stopped at 02/09/15 1453  . levothyroxine (SYNTHROID, LEVOTHROID) tablet 100 mcg  100 mcg Oral QAC breakfast Vena Austria, MD   100 mcg at 02/09/15 [phone]  . lidocaine (PF) (XYLOCAINE) 1 % injection 30 mL  30 mL Subcutaneous PRN Conard Novak, MD      . magnesium sulfate 40 g in lactated ringers 500 mL (0.08 g/mL) OB infusion  2 g/hr Intravenous Continuous Vena Austria, MD 25 mL/hr at 02/09/15 1834 2 g/hr at 02/09/15 1834  . ondansetron (ZOFRAN) injection 4 mg  4 mg Intravenous Q6H PRN Conard Novak, MD      . oxytocin (PITOCIN) injection 10 Units  10 Units Intramuscular Once Conard Novak, MD      . oxytocin (PITOCIN) IV BOLUS FROM BAG  500 mL Intravenous Continuous Conard Novak, MD      . oxytocin (PITOCIN) IV infusion 40 units in LR 1000 mL  62.5 mL/hr Intravenous Continuous Conard Novak, MD      . oxytocin (PITOCIN) IV infusion 40 units in LR 1000 mL  1-40 milli-units/min Intravenous Titrated Vena Austria, MD 28.5 mL/hr at 02/09/15 2230 19 milli-units/min at 02/09/15 2230  .  penicillin G potassium 2.5 Million Units in dextrose 5 % 100 mL IVPB  2.5 Million Units Intravenous Q4H Conard Novak, MD   2.5 Million Units at 02/09/15 2311  . sodium chloride 0.9 % injection           . terbutaline (BRETHINE) injection 0.25 mg  0.25 mg Subcutaneous Once PRN Conard Novak, MD       Facility-Administered Medications Ordered in Other Encounters  Medication Dose Route Frequency Provider Last Rate Last Dose  . bupivacaine (PF) (MARCAINE) 0.25 % injection    Anesthesia Intra-op Rosaria Ferries, MD   5 mL at 02/09/15 2254  . lidocaine (PF) (XYLOCAINE) 1 % injection    Anesthesia Intra-op Rosaria Ferries, MD   1 mL at 02/09/15 2240  . lidocaine-EPINEPHrine 1.5 %-1:200000 injection    Anesthesia Intra-op Rosaria Ferries, MD   3 mL at 02/09/15 2248    Assessment & Plan:  Pt stable *IUP: category II with accels. ?subtle lates have resolved and patient with normal baseline, accels and moderate variability. Will continue to monitor -EFW: 3200gm, cephalic on BSUS earlier today *Pre-eclampsia with severe features (HA): No current s/s. BPs better with epidural placement, too. Surveillance labs ordered for 0600 *IOL: started at approx 0130 on 11/7. Continue pit per prtoocl, currently at 20. -s/p cervidil, then pitocin and then PO miso and foley bulb.  *BMI 41: tracing well *Hypothyroidism: continue home synthroid *GBS pos: continue with PCN *Analgesia: epidural in place  Cornelia Copa MD [facility] Pager [phone]

## 2015-02-11 LAB — CBC
HEMATOCRIT: 31.7 % — AB (ref 35.0–47.0)
HEMOGLOBIN: 10.3 g/dL — AB (ref 12.0–16.0)
MCH: 28.5 pg (ref 26.0–34.0)
MCHC: 32.6 g/dL (ref 32.0–36.0)
MCV: 87.4 fL (ref 80.0–100.0)
Platelets: 224 10*3/uL (ref 150–440)
RBC: 3.63 MIL/uL — AB (ref 3.80–5.20)
RDW: 15.8 % — AB (ref 11.5–14.5)
WBC: 16.2 10*3/uL — AB (ref 3.6–11.0)

## 2015-02-11 LAB — SURGICAL PATHOLOGY

## 2015-02-11 NOTE — Addendum Note (Signed)
Addendum  created 02/11/15 [id number] by Mathews ArgyleBenjamin Larin Weissberg, CRNA   Modules edited: Notes Section   Notes Section:  File: [id number]

## 2015-02-11 NOTE — Anesthesia Postprocedure Evaluation (Addendum)
  Anesthesia Post-op Note  Patient: Debra Bender  Procedure(s) Performed: * No procedures listed *  Anesthesia type: epidural  Patient location: LDR 6  Post pain: Pain level controlled  Post assessment: Post-op Vital signs reviewed, Patient's Cardiovascular Status Stable, Respiratory Function Stable, Patent Airway and No signs of Nausea or vomiting  Post vital signs: Reviewed and stable  Last Vitals:  Filed Vitals:   02/11/15 0807  BP: 138/84  Pulse: 74  Temp:   Resp:     Level of consciousness: awake, alert  and patient cooperative  Complications: No apparent anesthesia complications

## 2015-02-11 NOTE — Lactation Note (Signed)
This note was copied from the chart of Debra Bender Meikle. Lactation Consultation Note  Patient Name: Debra Bender [place] ZOXWR'UToday's Date: 02/11/2015 Reason for consult: Initial assessment   Maternal Data    Feeding Feeding Type: Breast Fed  LATCH Score/Interventions Latch: Grasps breast easily, tongue down, lips flanged, rhythmical sucking.  Audible Swallowing: Spontaneous and intermittent  Type of Nipple: Everted at rest and after stimulation  Comfort (Breast/Nipple): Soft / non-tender     Hold (Positioning): Assistance needed to correctly position infant at breast and maintain latch. Mother has a laot of struggle getting baby to the left breast.  LATCH Score: 9  Lactation Tools Discussed/Used   Discussed feeding the baby in football in hold on left side.  Consult Status Consult Status: Follow-up Follow-up type: In-patient    Trudee GripCarolyn P [name] 02/11/2015, 2:07 PM

## 2015-02-11 NOTE — Progress Notes (Signed)
Subjective:  No headaches, vision changes, RUQ or epigastric pain.  Minimal lochia.    Objective:   Filed Vitals:   02/11/15 0707 02/11/15 0723 02/11/15 0732 02/11/15 0807  BP: 119/79 131/80 139/86 138/84  Pulse: 72 91 74 74  Temp:  98.1 F (36.7 C)    TempSrc:  Oral    Resp:  16    Height:      Weight:      SpO2:        Intake/Output Summary (Last 24 hours) at 02/11/15 0911 Last data filed at 02/11/15 0820  Gross per 24 hour  Intake      0 ml  Output   4170 ml  Net  -4170 ml    General: NAD Pulmonary: no increased work of breathing Abdomen: non-distended, non-tender, fundus firm at level of umbilicus Extremities: no edema, no erythema, no tenderness  Results for orders placed or performed during the hospital encounter of 02/07/15 (from the past 72 hour(s))  CBC     Status: Abnormal   Collection Time: 02/09/15 12:48 PM  Result Value Ref Range   WBC 13.4 (H) 3.6 - 11.0 K/uL   RBC 4.00 3.80 - 5.20 MIL/uL   Hemoglobin 11.5 (L) 12.0 - 16.0 g/dL   HCT 34.8 (L) 35.0 - 47.0 %   MCV 86.9 80.0 - 100.0 fL   MCH 28.6 26.0 - 34.0 pg   MCHC 32.9 32.0 - 36.0 g/dL   RDW 15.4 (H) 11.5 - 14.5 %   Platelets 248 150 - 440 K/uL  Comprehensive metabolic panel     Status: Abnormal   Collection Time: 02/09/15 12:48 PM  Result Value Ref Range   Sodium 135 135 - 145 mmol/L   Potassium 4.0 3.5 - 5.1 mmol/L   Chloride 103 101 - 111 mmol/L   CO2 22 22 - 32 mmol/L   Glucose, Bld 108 (H) 65 - 99 mg/dL   BUN 7 6 - 20 mg/dL   Creatinine, Ser 0.60 0.44 - 1.00 mg/dL   Calcium 8.3 (L) 8.9 - 10.3 mg/dL   Total Protein 7.2 6.5 - 8.1 g/dL   Albumin 3.1 (L) 3.5 - 5.0 g/dL   AST 18 15 - 41 U/L   ALT 11 (L) 14 - 54 U/L   Alkaline Phosphatase 87 38 - 126 U/L   Total Bilirubin 0.5 0.3 - 1.2 mg/dL   GFR calc non Af Amer >60 >60 mL/min   GFR calc Af Amer >60 >60 mL/min    Comment: (NOTE) The eGFR has been calculated using the CKD EPI equation. This calculation has not been validated in all  clinical situations. eGFR's persistently <60 mL/min signify possible Chronic Kidney Disease.    Anion gap 10 5 - 15  CBC     Status: Abnormal   Collection Time: 02/09/15  8:50 PM  Result Value Ref Range   WBC 17.2 (H) 3.6 - 11.0 K/uL   RBC 4.23 3.80 - 5.20 MIL/uL   Hemoglobin 12.0 12.0 - 16.0 g/dL   HCT 36.6 35.0 - 47.0 %   MCV 86.5 80.0 - 100.0 fL   MCH 28.5 26.0 - 34.0 pg   MCHC 32.9 32.0 - 36.0 g/dL   RDW 15.4 (H) 11.5 - 14.5 %   Platelets 252 150 - 440 K/uL  Comprehensive metabolic panel     Status: Abnormal   Collection Time: 02/09/15  8:50 PM  Result Value Ref Range   Sodium 133 (L) 135 - 145 mmol/L  Potassium 4.0 3.5 - 5.1 mmol/L   Chloride 104 101 - 111 mmol/L   CO2 22 22 - 32 mmol/L   Glucose, Bld 94 65 - 99 mg/dL   BUN 6 6 - 20 mg/dL   Creatinine, Ser 0.54 0.44 - 1.00 mg/dL   Calcium 7.9 (L) 8.9 - 10.3 mg/dL   Total Protein 7.2 6.5 - 8.1 g/dL   Albumin 3.1 (L) 3.5 - 5.0 g/dL   AST 16 15 - 41 U/L   ALT 11 (L) 14 - 54 U/L   Alkaline Phosphatase 96 38 - 126 U/L   Total Bilirubin 0.8 0.3 - 1.2 mg/dL   GFR calc non Af Amer >60 >60 mL/min   GFR calc Af Amer >60 >60 mL/min    Comment: (NOTE) The eGFR has been calculated using the CKD EPI equation. This calculation has not been validated in all clinical situations. eGFR's persistently <60 mL/min signify possible Chronic Kidney Disease.    Anion gap 7 5 - 15  Comprehensive metabolic panel     Status: Abnormal   Collection Time: 02/10/15  6:49 AM  Result Value Ref Range   Sodium 135 135 - 145 mmol/L   Potassium 4.3 3.5 - 5.1 mmol/L   Chloride 103 101 - 111 mmol/L   CO2 24 22 - 32 mmol/L   Glucose, Bld 104 (H) 65 - 99 mg/dL   BUN 6 6 - 20 mg/dL   Creatinine, Ser 0.68 0.44 - 1.00 mg/dL   Calcium 8.0 (L) 8.9 - 10.3 mg/dL   Total Protein 7.0 6.5 - 8.1 g/dL   Albumin 2.9 (L) 3.5 - 5.0 g/dL   AST 21 15 - 41 U/L   ALT 10 (L) 14 - 54 U/L   Alkaline Phosphatase 85 38 - 126 U/L   Total Bilirubin 0.6 0.3 - 1.2 mg/dL    GFR calc non Af Amer >60 >60 mL/min   GFR calc Af Amer >60 >60 mL/min    Comment: (NOTE) The eGFR has been calculated using the CKD EPI equation. This calculation has not been validated in all clinical situations. eGFR's persistently <60 mL/min signify possible Chronic Kidney Disease.    Anion gap 8 5 - 15  CBC     Status: Abnormal   Collection Time: 02/10/15  6:49 AM  Result Value Ref Range   WBC 18.8 (H) 3.6 - 11.0 K/uL   RBC 4.10 3.80 - 5.20 MIL/uL   Hemoglobin 11.6 (L) 12.0 - 16.0 g/dL   HCT 35.9 35.0 - 47.0 %   MCV 87.5 80.0 - 100.0 fL   MCH 28.2 26.0 - 34.0 pg   MCHC 32.2 32.0 - 36.0 g/dL   RDW 15.4 (H) 11.5 - 14.5 %   Platelets 261 150 - 440 K/uL  CBC     Status: Abnormal   Collection Time: 02/11/15  5:47 AM  Result Value Ref Range   WBC 16.2 (H) 3.6 - 11.0 K/uL   RBC 3.63 (L) 3.80 - 5.20 MIL/uL   Hemoglobin 10.3 (L) 12.0 - 16.0 g/dL   HCT 31.7 (L) 35.0 - 47.0 %   MCV 87.4 80.0 - 100.0 fL   MCH 28.5 26.0 - 34.0 pg   MCHC 32.6 32.0 - 36.0 g/dL   RDW 15.8 (H) 11.5 - 14.5 %   Platelets 224 150 - 440 K/uL    Assessment:   29 y.o. G1P1001 postpartum day #1 TSVD superimposed preeclampsia with severe features vs preeclampsia with severe features based on neurologic symptoms  Plan:    1) Acute blood loss anemia - hemodynamically stable and asymptomatic - po ferrous sulfate  2) B pos / RI / VZI  3) Preeclampsia - running normotensive, no headaches, finish out 24-hrs course of magnesium sulfate   4) Disposition - anticipate discharge PPD#2

## 2015-02-12 MED ORDER — WHITE PETROLATUM GEL
Status: AC
  Filled 2015-02-12: qty 5

## 2015-02-12 NOTE — Progress Notes (Signed)
Patient ID: Debra Bender, female   DOB: 10/19/1985, 29 y.o.   MRN: [id number]  Subjective:  No headaches, vision changes, RUQ or epigastric pain.  Minimal lochia.  +amb, +void, +tolerating PO  Objective:   Filed Vitals:   02/11/15 1920 02/11/15 2359 02/12/15 0333 02/12/15 0807  BP: 136/70 137/76 139/81 145/88  Pulse: 84 79 71 61  Temp: 98.7 F (37.1 C) 98.3 F (36.8 C) 98.4 F (36.9 C) 98 F (36.7 C)  TempSrc: Oral Oral Oral Oral  Resp: '20 18 18 20  ' Height:      Weight:      SpO2: 100% 100% 92% 98%    Intake/Output Summary (Last 24 hours) at 02/12/15 0930 Last data filed at 02/11/15 1802  Gross per 24 hour  Intake      0 ml  Output   1600 ml  Net  -1600 ml   General: NAD Pulmonary: no increased work of breathing Abdomen: non-distended, non-tender, fundus firm at level of umbilicus Extremities: no edema, no erythema, no tenderness  Results for orders placed or performed during the hospital encounter of 02/07/15 (from the past 72 hour(s))  CBC     Status: Abnormal   Collection Time: 02/09/15 12:48 PM  Result Value Ref Range   WBC 13.4 (H) 3.6 - 11.0 K/uL   RBC 4.00 3.80 - 5.20 MIL/uL   Hemoglobin 11.5 (L) 12.0 - 16.0 g/dL   HCT 34.8 (L) 35.0 - 47.0 %   MCV 86.9 80.0 - 100.0 fL   MCH 28.6 26.0 - 34.0 pg   MCHC 32.9 32.0 - 36.0 g/dL   RDW 15.4 (H) 11.5 - 14.5 %   Platelets 248 150 - 440 K/uL  Comprehensive metabolic panel     Status: Abnormal   Collection Time: 02/09/15 12:48 PM  Result Value Ref Range   Sodium 135 135 - 145 mmol/L   Potassium 4.0 3.5 - 5.1 mmol/L   Chloride 103 101 - 111 mmol/L   CO2 22 22 - 32 mmol/L   Glucose, Bld 108 (H) 65 - 99 mg/dL   BUN 7 6 - 20 mg/dL   Creatinine, Ser 0.60 0.44 - 1.00 mg/dL   Calcium 8.3 (L) 8.9 - 10.3 mg/dL   Total Protein 7.2 6.5 - 8.1 g/dL   Albumin 3.1 (L) 3.5 - 5.0 g/dL   AST 18 15 - 41 U/L   ALT 11 (L) 14 - 54 U/L   Alkaline Phosphatase 87 38 - 126 U/L   Total Bilirubin 0.5 0.3 - 1.2 mg/dL   GFR calc non  Af Amer >60 >60 mL/min   GFR calc Af Amer >60 >60 mL/min    Comment: (NOTE) The eGFR has been calculated using the CKD EPI equation. This calculation has not been validated in all clinical situations. eGFR's persistently <60 mL/min signify possible Chronic Kidney Disease.    Anion gap 10 5 - 15  CBC     Status: Abnormal   Collection Time: 02/09/15  8:50 PM  Result Value Ref Range   WBC 17.2 (H) 3.6 - 11.0 K/uL   RBC 4.23 3.80 - 5.20 MIL/uL   Hemoglobin 12.0 12.0 - 16.0 g/dL   HCT 36.6 35.0 - 47.0 %   MCV 86.5 80.0 - 100.0 fL   MCH 28.5 26.0 - 34.0 pg   MCHC 32.9 32.0 - 36.0 g/dL   RDW 15.4 (H) 11.5 - 14.5 %   Platelets 252 150 - 440 K/uL  Comprehensive metabolic panel  Status: Abnormal   Collection Time: 02/09/15  8:50 PM  Result Value Ref Range   Sodium 133 (L) 135 - 145 mmol/L   Potassium 4.0 3.5 - 5.1 mmol/L   Chloride 104 101 - 111 mmol/L   CO2 22 22 - 32 mmol/L   Glucose, Bld 94 65 - 99 mg/dL   BUN 6 6 - 20 mg/dL   Creatinine, Ser 0.54 0.44 - 1.00 mg/dL   Calcium 7.9 (L) 8.9 - 10.3 mg/dL   Total Protein 7.2 6.5 - 8.1 g/dL   Albumin 3.1 (L) 3.5 - 5.0 g/dL   AST 16 15 - 41 U/L   ALT 11 (L) 14 - 54 U/L   Alkaline Phosphatase 96 38 - 126 U/L   Total Bilirubin 0.8 0.3 - 1.2 mg/dL   GFR calc non Af Amer >60 >60 mL/min   GFR calc Af Amer >60 >60 mL/min    Comment: (NOTE) The eGFR has been calculated using the CKD EPI equation. This calculation has not been validated in all clinical situations. eGFR's persistently <60 mL/min signify possible Chronic Kidney Disease.    Anion gap 7 5 - 15  Comprehensive metabolic panel     Status: Abnormal   Collection Time: 02/10/15  6:49 AM  Result Value Ref Range   Sodium 135 135 - 145 mmol/L   Potassium 4.3 3.5 - 5.1 mmol/L   Chloride 103 101 - 111 mmol/L   CO2 24 22 - 32 mmol/L   Glucose, Bld 104 (H) 65 - 99 mg/dL   BUN 6 6 - 20 mg/dL   Creatinine, Ser 0.68 0.44 - 1.00 mg/dL   Calcium 8.0 (L) 8.9 - 10.3 mg/dL   Total  Protein 7.0 6.5 - 8.1 g/dL   Albumin 2.9 (L) 3.5 - 5.0 g/dL   AST 21 15 - 41 U/L   ALT 10 (L) 14 - 54 U/L   Alkaline Phosphatase 85 38 - 126 U/L   Total Bilirubin 0.6 0.3 - 1.2 mg/dL   GFR calc non Af Amer >60 >60 mL/min   GFR calc Af Amer >60 >60 mL/min    Comment: (NOTE) The eGFR has been calculated using the CKD EPI equation. This calculation has not been validated in all clinical situations. eGFR's persistently <60 mL/min signify possible Chronic Kidney Disease.    Anion gap 8 5 - 15  CBC     Status: Abnormal   Collection Time: 02/10/15  6:49 AM  Result Value Ref Range   WBC 18.8 (H) 3.6 - 11.0 K/uL   RBC 4.10 3.80 - 5.20 MIL/uL   Hemoglobin 11.6 (L) 12.0 - 16.0 g/dL   HCT 35.9 35.0 - 47.0 %   MCV 87.5 80.0 - 100.0 fL   MCH 28.2 26.0 - 34.0 pg   MCHC 32.2 32.0 - 36.0 g/dL   RDW 15.4 (H) 11.5 - 14.5 %   Platelets 261 150 - 440 K/uL  Surgical pathology     Status: None   Collection Time: 02/10/15  1:23 PM  Result Value Ref Range   SURGICAL PATHOLOGY      Surgical Pathology CASE: [id number] PATIENT: Debra Bender Surgical Pathology Report     SPECIMEN SUBMITTED: A. Placenta  CLINICAL HISTORY: 29 year old who's labor was induced for preeclampsia.  Velamentous vs battledore cord insertion; 37 WEEKS 2 DAYS  PRE-OPERATIVE DIAGNOSIS: None provided  POST-OPERATIVE DIAGNOSIS: None provided     DIAGNOSIS: A. PLACENTA; SPONTANEOUS VAGINAL DELIVERY: - THIRD TRIMESTER DISCOID PLACENTA, 440 GRAMS (>25TH  PERCENTILE FOR ESTIMATED GESTATIONAL AGE OF [redacted] WEEKS)  WITH FINDINGS OF UTERO PLACENTAL UNDER PERFUSION. -THREE-VESSEL UMBILICAL CORD WITH MARGINAL INSERTION. - NEGATIVE FOR FUNISITIS AND CHORIOAMNIONITIS.   GROSS DESCRIPTION:  A. Labeled: patient's name and medical record number  Weight: 440 grams  Size: 18.5 x 15.3 x 2.0 cm  Shape: ovoid  Accessory lobes: no  Membranes: purple tan translucent  Umbilical cord:      Length -32 cm in length x  1.1-2.3 cm in diameter      Number of vessels -3       Insertion - marginal      Distance of insertion from margin -1.5 cm      Other findings -the parenchyma is more than 0.1-0.2 cm in thickness in the edge where the umbilical cord inserts  Fetal surface: granular blue gray  Maternal surface: torn focally  Comments: sectioning the parenchyma is crepitant and red  Block summary: 1 - umbilical cord 2 - membrane rolls 3-4 - placental parenchyma   Final Diagnosis performed by Delorse Lek, MD.  Electronically signed 02/11/2015 11:27:23AM    The electronic signature indicates that the named Attending Pathologist has evaluated the specimen  Technical component performed at [facility], [address], [place], [place] [address] Lab: [phone] Dir: Darrick Penna. Evette Doffing, MD  Professional component performed at [facility], [facility], [place], [place], [place] [address] Lab: [phone] Dir: Dellia Nims. Rubinas, MD    CBC     Status: Abnormal   Collection Time: 02/11/15  5:47 AM  Result Value Ref Range   WBC 16.2 (H) 3.6 - 11.0 K/uL   RBC 3.63 (L) 3.80 - 5.20 MIL/uL   Hemoglobin 10.3 (L) 12.0 - 16.0 g/dL   HCT 31.7 (L) 35.0 - 47.0 %   MCV 87.4 80.0 - 100.0 fL   MCH 28.5 26.0 - 34.0 pg   MCHC 32.6 32.0 - 36.0 g/dL   RDW 15.8 (H) 11.5 - 14.5 %   Platelets 224 150 - 440 K/uL    Assessment:   29 y.o. G1P1001 postpartum day #2 TSVD superimposed preeclampsia with severe features vs preeclampsia with severe features based on neurologic symptoms  Plan:   1) Acute blood loss anemia - hemodynamically stable and asymptomatic - po ferrous sulfate  2) B pos / RI / VZI  3) Preeclampsia - running mostly normotensive with recent elevated BPs. After long discussion with patient, will watch BPs for another 24 hours, then home, if stable.    4) Disposition - anticipate discharge PPD#3  Prentice Docker, MD 02/12/2015 9:31 AM

## 2015-02-13 MED ORDER — HYDRALAZINE HCL 20 MG/ML IJ SOLN
5.0000 mg | Freq: Once | INTRAMUSCULAR | Status: AC
  Administered 2015-02-13: 5 mg via INTRAVENOUS
  Filled 2015-02-13: qty 1

## 2015-02-13 MED ORDER — IBUPROFEN 600 MG PO TABS: 600.0000 mg | ORAL_TABLET | Freq: Four times a day (QID) | ORAL | Status: DC

## 2015-02-13 MED ORDER — ACETAMINOPHEN 325 MG PO TABS
650.0000 mg | ORAL_TABLET | ORAL | Status: DC | PRN
Start: 2015-02-13 — End: 2016-10-12

## 2015-02-13 MED ORDER — NIFEDIPINE ER OSMOTIC RELEASE 30 MG PO TB24
30.0000 mg | ORAL_TABLET | Freq: Every day | ORAL | Status: DC
  Administered 2015-02-13 – 2015-02-14 (×2): 30 mg via ORAL
  Filled 2015-02-13 (×2): qty 1

## 2015-02-13 NOTE — Progress Notes (Signed)
Upon discharge orders being placed and patient being readied for discharge home, BP was taken and was 169/94.  Due to this discharge has been suspended and further evaluation and likely treatment of BPs has been initiated.  ----- Ranae Plumberhelsea Ward, MD Attending Obstetrician and Gynecologist [facility]

## 2015-02-13 NOTE — Progress Notes (Signed)
Notified Brielle, Rn of bp 

## 2015-02-13 NOTE — Progress Notes (Signed)
Post Partum Day 3 Subjective: Doing well, no complaints.  Tolerating regular diet, pain with PO meds, voiding and ambulating without difficulty.  No CP SOB F/C N/V or leg pain no HA change of vision, RUQ/epigastric pain  Objective:  Last 24hr ranges/current: Temp:  [98 F (36.7 C)-98.2 F (36.8 C)] 98 F (36.7 C) (11/12 0747) Pulse Rate:  [64-78] 72 (11/12 0747) Resp:  [18-20] 18 (11/12 0747) BP: (140-159)/(80-95) 148/88 mmHg (11/12 0747) SpO2:  [98 %] 98 % (11/12 0747)  Physical Exam:  General: NAD CV: RRR Pulm: nl effort, CTABL Lochia: moderate Uterine Fundus: fundus firm and below umbilicus DVT Evaluation: no cords, ttp LEs    Recent Labs  02/11/15 0547  HGB 10.3*  HCT 31.7*  WBC 16.2*  PLT 224    Assessment/Plan: 29 y.o. G1P1001 postpartum day # 3 s/p SVD with superimposed preeclampsia with severe features (BPs) and s/p Mag sulfate intra and post partum.  1. Post partum:  Doing well, no issues, continue routine cares. 2. CHTN: asymptomatic and now back to mild ranges.  Had a borderline 159 systolic last night, will continue to watch during this morning/early afternoon.  If all stays stable, then will d/c home later afternoon.     ----- Ranae Plumberhelsea Crimson Dubberly, MD Attending Obstetrician and Gynecologist [facility]

## 2015-02-13 NOTE — Discharge Instructions (Signed)
Discharge instructions:   Call office if you have any of the following: headache, visual changes, fever >100 F, chills, breast concerns, excessive vaginal bleeding, incision drainage or problems, leg pain or redness, depression or any other concerns.   Activity: Do not lift > 10 lbs for 6 weeks.  No intercourse or tampons for 6 weeks.  No driving for 1-2 weeks.   Continue prenatal vitamin and iron.  Increase calories and fluids while breastfeeding. For questions about your baby please call the pediatrician For questions about breastfeeding please call the lactation consultant

## 2015-02-14 MED ORDER — LABETALOL HCL 200 MG PO TABS
200.0000 mg | ORAL_TABLET | Freq: Two times a day (BID) | ORAL | Status: DC
  Administered 2015-02-14: 200 mg via ORAL
  Filled 2015-02-14: qty 1

## 2015-02-14 MED ORDER — NIFEDIPINE ER 30 MG PO TB24: 30.0000 mg | ORAL_TABLET | Freq: Every day | ORAL | Status: DC

## 2015-02-14 MED ORDER — LABETALOL HCL 200 MG PO TABS: 200.0000 mg | ORAL_TABLET | Freq: Two times a day (BID) | ORAL | Status: DC

## 2015-02-14 NOTE — Progress Notes (Signed)
Patient understands all discharge instructions and the need to make follow up appointments. Patient discharge via wheelchair with RN. 

## 2015-02-14 NOTE — Progress Notes (Signed)
Post Partum Day 4 Subjective: Doing well, no complaints.  Tolerating regular diet, pain with PO meds, voiding and ambulating without difficulty.  Yesterday blood pressures remained mild-range throughout the day and near discharge home was found to have elevated BPs to the 160s. She was kept inpatient while medication was administered.  Procardia XR 30mg  was initiated and Hydralazine was pushed x2 in IV.  Since then her BPs have remained mild-range and she remains asymptomatic.  No CP SOB F/C N/V or leg pain no HA change of vision, RUQ/epigastric pain  Objective: Current VS: BP 146/100 mmHg  Pulse 85  Temp(Src) 97.6 F (36.4 C) (Oral)  Resp 18  SpO2 99%  Last 24hrs: Temp:  [97.6 F (36.4 C)-98.5 F (36.9 C)] 97.6 F (36.4 C) (11/13 0820) Pulse Rate:  [64-85] 85 (11/13 1020) Resp:  [16-18] 18 (11/13 1020) BP: (130-169)/(87-105) 146/100 mmHg (11/13 1020) SpO2:  [96 %-99 %] 99 % (11/13 0820)  Physical Exam:  General: NAD CV: RRR Pulm: nl effort, CTABL Lochia: moderate Uterine Fundus: fundus firm and below umbilicus DVT Evaluation: no cords, ttp LEs    Assessment/Plan: 29 y.o. G1P1001 postpartum day # 4 s/p SVD with preeclampsia with severe features  1. Post partum: breastfeeding/pumping.  Doing great, continue routine cares 2. HTN: Procardia has not had a significant impact thus far on her BPs, albeit they are in mild range.  Will continue to monitor.  May consider change/addition of Labetalol or Hydralazine PO.  q2hr BP checks, or sooner PRN.     ----- Ranae Plumberhelsea Yuleimy Kretz, MD Attending Obstetrician and Gynecologist [facility]

## 2015-11-01 DIAGNOSIS — Z8639 Personal history of other endocrine, nutritional and metabolic disease: Secondary | ICD-10-CM | POA: Insufficient documentation

## 2016-02-16 DIAGNOSIS — I10 Essential (primary) hypertension: Secondary | ICD-10-CM | POA: Insufficient documentation

## 2016-02-21 ENCOUNTER — Ambulatory Visit: Payer: Medicaid Other | Admitting: Dietician

## 2016-03-14 ENCOUNTER — Encounter: Payer: Self-pay | Admitting: Dietician

## 2016-03-14 NOTE — Progress Notes (Signed)
Patient has not responded to reschedule her appointment which she missed on 02/21/16. Sent letter to MD office.

## 2016-04-17 DIAGNOSIS — K219 Gastro-esophageal reflux disease without esophagitis: Secondary | ICD-10-CM | POA: Insufficient documentation

## 2016-05-24 LAB — FETAL NONSTRESS TEST

## 2016-10-12 ENCOUNTER — Ambulatory Visit (INDEPENDENT_AMBULATORY_CARE_PROVIDER_SITE_OTHER): Payer: BLUE CROSS/BLUE SHIELD | Admitting: Advanced Practice Midwife

## 2016-10-12 ENCOUNTER — Encounter: Payer: Self-pay | Admitting: Advanced Practice Midwife

## 2016-10-12 VITALS — BP 128/84 | Wt 232.0 lb

## 2016-10-12 DIAGNOSIS — Z6839 Body mass index (BMI) 39.0-39.9, adult: Secondary | ICD-10-CM

## 2016-10-12 DIAGNOSIS — IMO0001 Reserved for inherently not codable concepts without codable children: Secondary | ICD-10-CM

## 2016-10-12 DIAGNOSIS — Z131 Encounter for screening for diabetes mellitus: Secondary | ICD-10-CM

## 2016-10-12 DIAGNOSIS — O10919 Unspecified pre-existing hypertension complicating pregnancy, unspecified trimester: Secondary | ICD-10-CM

## 2016-10-12 DIAGNOSIS — O0990 Supervision of high risk pregnancy, unspecified, unspecified trimester: Secondary | ICD-10-CM

## 2016-10-12 DIAGNOSIS — Z113 Encounter for screening for infections with a predominantly sexual mode of transmission: Secondary | ICD-10-CM

## 2016-10-12 DIAGNOSIS — E669 Obesity, unspecified: Secondary | ICD-10-CM

## 2016-10-12 DIAGNOSIS — Z3490 Encounter for supervision of normal pregnancy, unspecified, unspecified trimester: Secondary | ICD-10-CM

## 2016-10-12 DIAGNOSIS — Z124 Encounter for screening for malignant neoplasm of cervix: Secondary | ICD-10-CM

## 2016-10-12 NOTE — Patient Instructions (Signed)

## 2016-10-12 NOTE — Progress Notes (Signed)
New Obstetric Patient H&P    Chief Complaint: "Desires prenatal care"   History of Present Illness: Patient is a 31 y.o. G2P1001 Not Hispanic or Latino female, LMP 08/11/2016 presents with amenorrhea and positive home pregnancy test. Based on her  LMP, her EDD is Estimated Date of Delivery: 05/18/2017. and her EGA is [redacted]w[redacted]d. Cycles are 6. days, regular, and occur approximately every : 28 days. Her last pap smear was 1 years ago and was no abnormalities.    She had a urine pregnancy test which was positive 2 week(s)  ago. Her last menstrual period was normal and lasted for  6 day(s). Since her LMP she claims she has experienced breast tenderness, fatigue, nausea. She denies vaginal bleeding. Her past medical history is contributory for chronic hypertension, obesity. Her prior pregnancies are notable for gestational HTN.  Since her LMP, she admits to the use of tobacco products  no She claims she has gained   no pounds since the start of her pregnancy.  There are cats in the home in the home  no  She admits close contact with children on a regular basis  yes  She has had chicken pox in the past yes She has had Tuberculosis exposures, symptoms, or previously tested positive for TB   no Current or past history of domestic violence. no  Genetic Screening/Teratology Counseling: (Includes patient, baby's father, or anyone in either family with:)   1. Patient's age >/= 48 at [facility]  no 2. Thalassemia ([place], [place], Mediterranean, or Asian background): MCV<80  no 3. Neural tube defect (meningomyelocele, spina bifida, anencephaly)  no 4. Congenital heart defect  FOB with congenital enlarged heart- fetal echocardiogram done with 1st pregnancy 5. Down syndrome  no 6. Tay-Sachs (Jewish, [place]  no 7. Canavan's Disease  no 8. Sickle cell disease or trait (African)  no  9. Hemophilia or other blood disorders  no  10. Muscular dystrophy  no  11. Cystic fibrosis  no  12. Huntington's  Chorea  no  13. Mental retardation/autism  no 14. Other inherited genetic or chromosomal disorder  no 15. Maternal metabolic disorder (DM, PKU, etc)  no 16. Patient or FOB with a child with a birth defect not listed above no  16a. Patient or FOB with a birth defect themselves no 17. Recurrent pregnancy loss, or stillbirth  no  18. Any medications since LMP other than prenatal vitamins (include vitamins, supplements, OTC meds, drugs, alcohol)  yes 19. Any other genetic/environmental exposure to discuss  no  Infection History:   1. Lives with someone with TB or TB exposed  no  2. Patient or partner has history of genital herpes  no 3. Rash or viral illness since LMP  no 4. History of STI (GC, CT, HPV, syphilis, HIV)  no 5. History of recent travel :  no  Other pertinent information:  no     Review of Systems:10 point review of systems negative unless otherwise noted in HPI  Past Medical History:  Past Medical History:  Diagnosis Date  . Anemia 12/2014  . Hypothyroidism 12/2014  . Obesity affecting pregnancy     Past Surgical History:  Past Surgical History:  Procedure Laterality Date  . FRACTURE SURGERY Right 31 years old    Gynecologic History: Patient's last menstrual period was 08/11/2016.  Obstetric History: G2P1001  Family History:  Family History  Problem Relation Age of Onset  . Depression Mother   . Heart disease Father   .  Asthma Brother   . Emphysema Maternal Grandmother   . Diabetes Maternal Grandfather   . Cirrhosis Paternal Grandmother   . Heart disease Paternal Grandfather     Social History:  Social History   Social History  . Marital status: Single    Spouse name: N/A  . Number of children: N/A  . Years of education: N/A   Occupational History  . Not on file.   Social History Main Topics  . Smoking status: Former Smoker    Quit date: 07/31/2014  . Smokeless tobacco: Never Used  . Alcohol use No  . Drug use: No  . Sexual activity: Yes      Birth control/ protection: None   Other Topics Concern  . Not on file   Social History Narrative  . No narrative on file    Allergies:  Allergies  Allergen Reactions  . Hydrochlorothiazide Other (See Comments)    Elevated creatinine  . Latex Itching and Rash    Medications: Prior to Admission medications   Medication Sig Start Date End Date Taking? Authorizing Provider  amLODipine (NORVASC) 5 MG tablet Take by mouth. 05/04/16 02/15/17 Yes [provider]  Prenatal Vit-Fe Fumarate-FA (PRENATAL MULTIVITAMIN) TABS tablet Take 1 tablet by mouth daily at 12 noon.   Yes [provider]  ranitidine (ZANTAC) 300 MG tablet  07/28/16  Yes [provider]  sucralfate (CARAFATE) 1 g tablet DISSOLVE 1 TABLET IN 1 OUNCE OF WATER AND TAKE 3 TIMES A DAY BEFORE MEALS AND AT BEDTIME 03/15/16  Yes [provider]  acetaminophen (TYLENOL) 325 MG tablet Take 2 tablets (650 mg total) by mouth every 4 (four) hours as needed for mild pain or headache. Patient not taking: Reported on 10/12/2016 02/13/15   Ward, Elenora Fender, MD  cholecalciferol (VITAMIN D) 1000 UNITS tablet Take 1,000 Units by mouth daily.    [provider]  ferrous sulfate 325 (65 FE) MG tablet Take 325 mg by mouth.    [provider]  folic acid (FOLVITE) 800 MCG tablet Take 400 mcg by mouth daily.    [provider]  ibuprofen (ADVIL,MOTRIN) 600 MG tablet Take 1 tablet (600 mg total) by mouth every 6 (six) hours. Patient not taking: Reported on 10/12/2016 02/13/15   Ward, Elenora Fender, MD  labetalol (NORMODYNE) 200 MG tablet Take 1 tablet (200 mg total) by mouth 2 (two) times daily. Patient not taking: Reported on 10/12/2016 02/14/15   Ward, Elenora Fender, MD  levothyroxine (SYNTHROID, LEVOTHROID) 100 MCG tablet Take 100 mcg by mouth daily before breakfast.    [provider]  NIFEdipine (PROCARDIA-XL/ADALAT CC) 30 MG 24 hr tablet Take 1 tablet (30 mg total) by mouth daily.  02/14/15   Ward, Elenora Fender, MD    Physical Exam Vitals: Blood pressure 128/84, weight 232 lb (105.2 kg), last menstrual period 08/11/2016, unknown if currently breastfeeding.  General: NAD HEENT: normocephalic, anicteric Thyroid: no enlargement, no palpable nodules Pulmonary: No increased work of breathing, CTAB Cardiovascular: RRR, distal pulses 2+ Abdomen: NABS, soft, non-tender, non-distended.  Umbilicus without lesions.  No hepatomegaly, splenomegaly or masses palpable. No evidence of hernia  Genitourinary:  External: Normal external female genitalia.  Normal urethral meatus, normal  Bartholin's and Skene's glands.    Vagina: Normal vaginal mucosa, no evidence of prolapse.    Cervix: Grossly normal in appearance, no bleeding, no CMT  Uterus: Enlarged, mobile, normal contour.    Adnexa: ovaries non-enlarged, no adnexal masses  Rectal: deferred Extremities: no edema,  erythema, or tenderness Neurologic: Grossly intact Psychiatric: mood appropriate, affect full   Assessment: 31 y.o. G2P1001 at Unknown presenting to initiate prenatal care  Plan: 1) Avoid alcoholic beverages. 2) Patient encouraged not to smoke.  3) Discontinue the use of all non-medicinal drugs and chemicals.  4) Take prenatal vitamins daily.  5) Nutrition, food safety (fish, cheese advisories, and high nitrite foods) and exercise discussed. 6) Hospital and practice style discussed with cross coverage system.  7) Genetic Screening, such as with 1st Trimester Screening, cell free fetal DNA, AFP testing, and Ultrasound, as well as with amniocentesis and CVS as appropriate, is discussed with patient. At the conclusion of today's visit patient requested genetic testing 8) Patient is asked about travel to areas at risk for the Zika virus, and counseled to avoid travel and exposure to mosquitoes or sexual partners who may have themselves been exposed to the virus. Testing is discussed, and will be ordered as appropriate.     Tresea MallJane Makina Skow, CNM

## 2016-10-12 NOTE — Progress Notes (Signed)
NOB today.  

## 2016-10-14 LAB — URINE CULTURE

## 2016-10-15 LAB — IGP,CTNGTV,RFX APTIMA HPV ASCU
Chlamydia, Nuc. Acid Amp: NEGATIVE
Gonococcus, Nuc. Acid Amp: NEGATIVE
PAP Smear Comment: 0
Trich vag by NAA: NEGATIVE

## 2016-10-25 ENCOUNTER — Encounter: Payer: BLUE CROSS/BLUE SHIELD | Admitting: Advanced Practice Midwife

## 2016-10-25 ENCOUNTER — Other Ambulatory Visit: Payer: BLUE CROSS/BLUE SHIELD

## 2016-10-25 ENCOUNTER — Ambulatory Visit: Payer: BLUE CROSS/BLUE SHIELD

## 2016-10-26 ENCOUNTER — Encounter: Payer: Self-pay | Admitting: Certified Nurse Midwife

## 2016-10-26 ENCOUNTER — Ambulatory Visit (INDEPENDENT_AMBULATORY_CARE_PROVIDER_SITE_OTHER): Payer: BLUE CROSS/BLUE SHIELD | Admitting: Certified Nurse Midwife

## 2016-10-26 ENCOUNTER — Ambulatory Visit (INDEPENDENT_AMBULATORY_CARE_PROVIDER_SITE_OTHER): Payer: BLUE CROSS/BLUE SHIELD

## 2016-10-26 VITALS — BP 118/68 | Wt 247.0 lb

## 2016-10-26 DIAGNOSIS — O1211 Gestational proteinuria, first trimester: Secondary | ICD-10-CM

## 2016-10-26 DIAGNOSIS — Z3490 Encounter for supervision of normal pregnancy, unspecified, unspecified trimester: Secondary | ICD-10-CM

## 2016-10-26 DIAGNOSIS — Z3A09 9 weeks gestation of pregnancy: Secondary | ICD-10-CM

## 2016-10-26 DIAGNOSIS — E039 Hypothyroidism, unspecified: Secondary | ICD-10-CM

## 2016-10-26 DIAGNOSIS — O99210 Obesity complicating pregnancy, unspecified trimester: Secondary | ICD-10-CM

## 2016-10-26 DIAGNOSIS — Z6841 Body Mass Index (BMI) 40.0 and over, adult: Secondary | ICD-10-CM

## 2016-10-26 DIAGNOSIS — O10919 Unspecified pre-existing hypertension complicating pregnancy, unspecified trimester: Secondary | ICD-10-CM

## 2016-10-26 DIAGNOSIS — O0990 Supervision of high risk pregnancy, unspecified, unspecified trimester: Secondary | ICD-10-CM

## 2016-10-26 LAB — RPR+RH+ABO+RUB AB+AB SCR+CB...
Antibody Screen: NEGATIVE
HEMATOCRIT: 37.5 % (ref 34.0–46.6)
HEMOGLOBIN: 12.9 g/dL (ref 11.1–15.9)
HEP B S AG: NEGATIVE
HIV Screen 4th Generation wRfx: NONREACTIVE
MCH: 29.5 pg (ref 26.6–33.0)
MCHC: 34.4 g/dL (ref 31.5–35.7)
MCV: 86 fL (ref 79–97)
Platelets: 277 10*3/uL (ref 150–379)
RBC: 4.38 x10E6/uL (ref 3.77–5.28)
RDW: 15.2 % (ref 12.3–15.4)
RH TYPE: POSITIVE
RPR: NONREACTIVE
RUBELLA: 1.5 {index} (ref >0.99)
VARICELLA: 1574 {index} (ref >165)
WBC: 10.3 10*3/uL (ref 3.4–10.8)

## 2016-10-26 LAB — COMPREHENSIVE METABOLIC PANEL
A/G RATIO: 1.4 (ref 1.2–2.2)
ALBUMIN: 4.3 g/dL (ref 3.5–5.5)
ALK PHOS: 49 IU/L (ref 39–117)
ALT: 9 IU/L (ref 0–32)
AST: 15 IU/L (ref 0–40)
BILIRUBIN TOTAL: 0.5 mg/dL (ref 0.0–1.2)
BUN/Creatinine Ratio: 9 (ref 9–23)
BUN: 6 mg/dL (ref 6–20)
CO2: 22 mmol/L (ref 20–29)
Calcium: 9.3 mg/dL (ref 8.7–10.2)
Chloride: 100 mmol/L (ref 96–106)
Creatinine, Ser: 0.68 mg/dL (ref 0.57–1.00)
GFR, EST AFRICAN AMERICAN: 137 mL/min/{1.73_m2} (ref >59)
GFR, EST NON AFRICAN AMERICAN: 119 mL/min/{1.73_m2} (ref >59)
GLOBULIN, TOTAL: 3 g/dL (ref 1.5–4.5)
Glucose: 86 mg/dL (ref 65–99)
Potassium: 4.1 mmol/L (ref 3.5–5.2)
SODIUM: 136 mmol/L (ref 134–144)
TOTAL PROTEIN: 7.3 g/dL (ref 6.0–8.5)

## 2016-10-26 LAB — PROTEIN / CREATININE RATIO, URINE
Creatinine, Urine: 52.1 mg/dL
PROTEIN/CREAT RATIO: 328 mg/g{creat} — AB (ref 0–200)
Protein, Ur: 17.1 mg/dL

## 2016-10-26 LAB — GLUCOSE, 1 HOUR GESTATIONAL: GESTATIONAL DIABETES SCREEN: 88 mg/dL (ref 65–139)

## 2016-10-26 MED ORDER — FOLIC ACID 1 MG PO TABS: 1.0000 mg | ORAL_TABLET | Freq: Every day | ORAL | 3 refills | Status: DC

## 2016-10-26 NOTE — Progress Notes (Signed)
Reviewed lab results. One hr GTT=88. PC ratio=328 mgm with a negative urine culture and normal creatinine and BUN Current BMI=41.87 kg/m2 Has not been on levothyroxine for over a year. PCP checked TFTs off of levothyroxine, but has not had thyroid levels checked this pregnancy. TSH and free T4 added to her labs from yesterday. Taking meclizine for nausea.  Dating scan today: SIUP with CRL 9wk4d changing EDC to 05/27/2016. FCA 167 BPM. Desires first trimester test and MSAFP RX for folic acid 1 mgm called in. Aware needs to start baby ASA daily at 12 weeks First trimester test and ROB in 3 weeks. Consult DP due to hx of CHTN and proteinuria.

## 2016-10-26 NOTE — Progress Notes (Signed)
Pt states previous weight entered incorrect. Per pt pregravid weight ~240. Pt c/o that OTC PNV making her sick. Dating scan today.

## 2016-10-30 LAB — T4, FREE: Free T4: 1.27 ng/dL (ref 0.82–1.77)

## 2016-10-30 LAB — TSH: TSH: 3.82 u[IU]/mL (ref 0.450–4.500)

## 2016-10-30 LAB — SPECIMEN STATUS REPORT

## 2016-11-06 ENCOUNTER — Ambulatory Visit
Admission: RE | Admit: 2016-11-06 | Discharge: 2016-11-06 | Disposition: A | Discharge status: Home / Self Care | Payer: Medicaid Other | Source: Ambulatory Visit | Attending: Maternal & Fetal Medicine | Admitting: Maternal & Fetal Medicine

## 2016-11-06 VITALS — BP 127/65 | HR 73 | Temp 98.2°F | Resp 18 | Ht 64.5 in | Wt 242.0 lb

## 2016-11-06 DIAGNOSIS — E039 Hypothyroidism, unspecified: Secondary | ICD-10-CM

## 2016-11-06 DIAGNOSIS — K219 Gastro-esophageal reflux disease without esophagitis: Secondary | ICD-10-CM

## 2016-11-06 DIAGNOSIS — Z8639 Personal history of other endocrine, nutritional and metabolic disease: Secondary | ICD-10-CM

## 2016-11-06 DIAGNOSIS — Z3A11 11 weeks gestation of pregnancy: Secondary | ICD-10-CM | POA: Diagnosis not present

## 2016-11-06 DIAGNOSIS — I10 Essential (primary) hypertension: Secondary | ICD-10-CM | POA: Diagnosis not present

## 2016-11-06 DIAGNOSIS — Z6841 Body Mass Index (BMI) 40.0 and over, adult: Secondary | ICD-10-CM | POA: Diagnosis not present

## 2016-11-06 DIAGNOSIS — Z8279 Family history of other congenital malformations, deformations and chromosomal abnormalities: Secondary | ICD-10-CM | POA: Diagnosis not present

## 2016-11-06 DIAGNOSIS — O99210 Obesity complicating pregnancy, unspecified trimester: Secondary | ICD-10-CM | POA: Diagnosis not present

## 2016-11-06 HISTORY — DX: Essential (primary) hypertension: I10

## 2016-11-06 HISTORY — DX: Gastro-esophageal reflux disease without esophagitis: K21.9

## 2016-11-06 NOTE — Progress Notes (Signed)
[facility] Consultation   Chief Complaint: CHTN, history of severe preeclampsia, and family history heart disease  HPI: [name] is a 31 y.o. G2P1001 at 48w1dby 928w4dSKoreaerformed [facility] of severe pre[facility], and family history of congenital heart disease. Her pregnancy is also complicated by obesity.  The patient develop HTN in the first or second trimester of her last pregnancy.  By 37 weeks, she had severe range blood pressures, but no proteinuria.  She was induced at 37 weeks.  She received magnesium sulfate for seizure prophylaxis.    After delivery, her pressure remained elevated.  Her PCP, Debra Bender the patient on amlodipine 5 mg per day.  The patient's BP has been well maintained on amlodipine subsequently.   In her last pregnancy, she took thyroid replacement for borderline hypothyroidism.  Obstetric History:  Obstetric History   G2   P1   T1   P0   A0   L1    SAB0   TAB0   Ectopic0   Multiple0   Live Births1     # Outcome Date GA Lbr Len/2nd Weight Sex Delivery Anes PTL Lv  2 Current           1 Term 02/10/15 3733w2d:17 / 02:11 6 lb 11.8 oz (3.055 kg) M Vag-Spont EPI  LIV     Name: Debra Bender Debra Bender     Complications: Severe preeclampsia     Apgar1:  8                Apgar5: 9      Gynecologic History:  Patient's last menstrual period was 08/11/2016.  Pap normal 2018  Past Medical History: Patient  has a past medical history of Anemia (12/2014); GERD (gastroesophageal reflux disease); Hypertension; Hypothyroidism; and Obesity affecting pregnancy.   Past Surgical History: She  has a past surgical history that includes Fracture surgery (Right, 10 y75ars old).   Medications:  Prenatal vitamins.  She has a prescription for folic acid to take when she can't tolerate prenatal vitamins. Zantac 150 mg  bid Amlodipine 5 mg qd  Allergies: Patient is allergic to hydrochlorothiazide and latex.   Social History: Patient  reports that she quit smoking about 2 years ago. She has never used smokeless tobacco. She reports that she does not drink alcohol or use drugs.   Family History: family history includes Asthma in her brother; Cirrhosis in her paternal grandmother; Depression in her mother; Diabetes in her maternal grandfather; Emphysema in her maternal grandmother; Heart disease in her father and paternal grandfather.   Review of Systems A full 12 point review of systems was negative or as noted in the History of Present Illness.  Physical Exam: BP 127/65   Pulse 73   Temp 98.2 F (36.8 C)   Resp 18   Ht 5' 4.5" (1.638 m)   Wt 242 lb (109.8 kg)   LMP 08/11/2016   SpO2 100%   BMI 40.90 kg/m   Brief US [place] confirm fetal viability - viable fetus with normal movement and cardiac motion  Labs 10/25/2016: P/C ratio 328 Creatinine 0.68 TSH 3.8 (high normal); Free T4 1.27 Glucose 88 62sessement:31 yo gravida 2 para 1001 at 11w85w1dtation with:    Patient Active Problem List  . Family history of congenital heart disease   . Hypothyroidism 02/08/2015  Recommendations: 1. CHTN and history of preeclampsia with stable BP on amlodipine (calcium channel blocker) and normal baseline labs -- Start ASA 81 mg now for preeclampsia prevention -- 24 hour urine for protein -- Fetal surveillance to include monthly ultrasounds for growth, weekly testing starting at [redacted] weeks gestation and twice weekly testing staring at [redacted] weeks gestation with delivery no later than [redacted] weeks gestation.   2. Morbid obesity with normal baseline labs -- Repeat glucose screen with third trimester labs -- Fetal surveillance as above 3. Family history of congenital heart disease -- The patient met with our genetic counselor today --  The patient would like to proceed with fetal echo, which she had in her last  pregnancy as well. 4. Borderline hypothyroidism - TSH in the high normal range with free T4 in the middle of the normal range.  No symptoms.   -- The patient was counseled that we generally like to see the TSH < 2.5 in pregnancy and the free T4 in the upper third of the normal range.  I would recommend repeating the TSH and free T4 in 1-3 months.  If the TSH is higher or the free T4 lower, ie below the middle of the normal range, I would recommend starting her on Synthroid 50 mcg per day.  If the TSH and free T4 remain stable, I would recommend checking them periodically during pregnancy.   Total time spent with the patient was 40 minutes with greater than 50% spent in counseling and coordination of care.  We appreciate this consult and will be happy to be involved in the ongoing care of [name] in anyway her obstetricians desire.  Erasmo Score, MD [facility]

## 2016-11-06 NOTE — Progress Notes (Addendum)
Referring Provider:  [facility] 40 minute consultation  [facility] to review prenatal screening and testing options due to a family history of a congenital heart defect.  This note summarizes the information we discussed.    We obtained a detailed family history and pregnancy history.  This is the second pregnancy for this couple.  The couple has a healthy 29 year old son.  Debra Bender reported no complications or exposures which are expected to increase the risk for birth defects. She does take medication for chronic hypertension and gastritis, as prescribed by her OB.  The family history is remarkable for numerous family members with cardiac conditions.  The father of the baby reports that he has hypertension and is followed by a cardiologist; his sister had twins who were both stillborn due to an enlarged heart at term; his father passed away at around 29 years old due to congestive heart failure and an enlarged heart.  Two paternal uncles and his paternal grandfather also had similar conditions. In Debra Bender's family, her mother has a heart murmur and her father and his brothers are reported to have heart conditions.  Also in the family, the patient stated that her mother's sister and brother have what is described to be hemochromatosis (they have too much iron and have to have blood removed regularly to treat it). She also said that she remembers the family (including herself) having testing years ago and they she and her mother did not have this condition.  The remainder of the family history is unremarkable for developmental delays, recurrent pregnancy loss, or known genetic conditions.  Primary hemochromatosis is also known as hereditary hemochromatosis.  Hereditary hemochromatosis (HHC) is characterized by inappropriately high absorption of iron by the gastrointestinal mucosa, resulting in excessive storage of iron,  particularly in the liver, skin, pancreas, heart, joints, and testes. Abdominal pain, weakness, lethargy, and weight loss are common early symptoms. Without therapy, patients usually develop symptoms between 7840 and 960 years of age in males and after menopause in females.  Hereditary Hemochromatosis is inherited as an autosomal recessive condition, meaning that it takes two genetic changes in a gene, one from each parent, in order to express symptoms of the condition.  Genetic testing is available for two common mutations in the HFE gene.    Approximately 1 in 639 Caucasian and 1 in 450 African American people in the general population carry the gene change for hemochromatosis.  Debra Bender remembers that her aunt had several family members get tested after she was diagnosed and that she did not have the condition.  However, we do not have medical records on what type of testing may have been performed or what the results are.  If this is the correct diagnosis, then the chance for Debra Bender's mother to be a carrier is 2/3 and the chance for Debra Bender to be a carrier is therefore 1/3.  There is a chance of 1 in 600 for this pregnancy to be affected with HH (1/3 X 1/50 X 1/4).  If Debra Bender would like more accurate estimates, we are happy to review medical records on her possible testing.  Although prenatal diagnosis is theoretically possible when both parents are found to be carriers, it is generally not recommended because of the associated risk from the amniocentesis procedure and the fact that this is an adult-onset, treatable disease.  Testing of at-risk children is also typically not  part of routine pediatric care.  Adults may consider testing due to the family history, but that would be the decision of the individual and his/her medical providers. If more is learned about her prior testing, we are happy to review it.  Congenital heart defects are among the most common birth defects, occurring in approximately  1 in 200 livebirths.  They may occur as an isolated condition or in combination with other differences in numerous genetic syndromes.  In the absence of a known genetic syndrome, most congenital heart defects are multifactorial, or caused by a combination of genetic and environmental factors.  In this family, it is unclear if there is a structural heart defect or other underlying condition which affects the function of the heart.  Clearly, given the number of affected individuals there is expected to be some genetic component.  It is difficult to accurately assess the chance for recurrence, however, because we cannot know what factors may be inherited and what factors may be environmental or lifestyle related.  However, if more information is learned or a specific genetic syndrome is identified as the cause, we are happy to discuss this information further.  We discussed that we may diagnose many heart defects by using a level 2 ultrasound after 16 to [redacted] weeks gestation.  A fetal echocardiogram is also recommended.  This is a specialized ultrasound of the fetal heart performed by a pediatric cardiologist after [redacted] weeks gestation.  However, it is important to remember that not all heart defects can be detected prenatally.    Debra Bender has first trimester screening planned at [facility].  She declined CF and SMA carrier screening today.  We will contact [facility] to schedule the fetal echocardiogram after [redacted] weeks gestation.  The patient was encouraged to call with questions or concerns.  We can be contacted at [phone].  Cherly Anderson, MS, CGC  I was immediately available and supervising. Argentina Ponder, MD [facility]

## 2016-11-14 ENCOUNTER — Other Ambulatory Visit: Payer: Self-pay | Admitting: Obstetrics and Gynecology

## 2016-11-14 DIAGNOSIS — Z3689 Encounter for other specified antenatal screening: Secondary | ICD-10-CM

## 2016-11-14 DIAGNOSIS — Z369 Encounter for antenatal screening, unspecified: Secondary | ICD-10-CM

## 2016-11-16 ENCOUNTER — Ambulatory Visit (INDEPENDENT_AMBULATORY_CARE_PROVIDER_SITE_OTHER): Payer: BLUE CROSS/BLUE SHIELD

## 2016-11-16 ENCOUNTER — Ambulatory Visit (INDEPENDENT_AMBULATORY_CARE_PROVIDER_SITE_OTHER): Payer: BLUE CROSS/BLUE SHIELD | Admitting: Obstetrics and Gynecology

## 2016-11-16 VITALS — BP 124/74 | Wt 245.0 lb

## 2016-11-16 DIAGNOSIS — Z3689 Encounter for other specified antenatal screening: Secondary | ICD-10-CM

## 2016-11-16 DIAGNOSIS — O0990 Supervision of high risk pregnancy, unspecified, unspecified trimester: Secondary | ICD-10-CM

## 2016-11-16 DIAGNOSIS — O1211 Gestational proteinuria, first trimester: Secondary | ICD-10-CM

## 2016-11-16 DIAGNOSIS — Z369 Encounter for antenatal screening, unspecified: Secondary | ICD-10-CM | POA: Diagnosis not present

## 2016-11-16 DIAGNOSIS — Z8279 Family history of other congenital malformations, deformations and chromosomal abnormalities: Secondary | ICD-10-CM

## 2016-11-16 DIAGNOSIS — E039 Hypothyroidism, unspecified: Secondary | ICD-10-CM

## 2016-11-16 DIAGNOSIS — Z1379 Encounter for other screening for genetic and chromosomal anomalies: Secondary | ICD-10-CM

## 2016-11-16 DIAGNOSIS — O99210 Obesity complicating pregnancy, unspecified trimester: Secondary | ICD-10-CM

## 2016-11-16 DIAGNOSIS — O10919 Unspecified pre-existing hypertension complicating pregnancy, unspecified trimester: Secondary | ICD-10-CM | POA: Insufficient documentation

## 2016-11-16 DIAGNOSIS — Z6841 Body Mass Index (BMI) 40.0 and over, adult: Secondary | ICD-10-CM

## 2016-11-16 NOTE — Progress Notes (Signed)
Routine Prenatal Care Visit  Subjective  Debra Bender is a 31 y.o. G2P1001 at [redacted]w[redacted]d being seen today for ongoing prenatal care.  She is currently monitored for the following issues for this high-risk pregnancy and has Maternal obesity, antepartum; Severe obesity (BMI >= 40) (HCC); Hypothyroidism; History of thyroid disorder; Gastroesophageal reflux disease without esophagitis; Essential hypertension; Supervision of high risk pregnancy, antepartum; Adult BMI 40.0-44.9 kg/sq m (HCC); Family history of congenital heart disease; and Chronic hypertension during pregnancy, antepartum on her problem list.  ----------------------------------------------------------------------------------- Patient reports nausea and vomiting.   Contractions: Not present. Vag. Bleeding: None.  Movement: Absent. Denies leaking of fluid.  NT u/s today. Start 24 h urine total protein per [facility]. [place] samples and rx given today.  ----------------------------------------------------------------------------------- The following portions of the patient's history were reviewed and updated as appropriate: allergies, current medications, past family history, past medical history, past social history, past surgical history and problem list. Problem list updated.   Objective  Blood pressure 124/74, weight 245 lb (111.1 kg), last menstrual period 08/11/2016, unknown if currently breastfeeding. Pregravid weight 240 lb (108.9 kg) Total Weight Gain 5 lb (2.268 kg) Urinalysis: Urine Protein: Negative Urine Glucose: Negative  Fetal Status: Fetal Heart Rate (bpm): present   Movement: Absent     General:  Alert, oriented and cooperative. Patient is in no acute distress.  Skin: Skin is warm and dry. No rash noted.   Cardiovascular: Normal heart rate noted  Respiratory: Normal respiratory effort, no problems with respiration noted  Abdomen: Soft, gravid, appropriate for gestational age. Pain/Pressure: Absent     Pelvic:   Cervical exam deferred        Extremities: Normal range of motion.     Mental Status: Normal mood and affect. Normal behavior. Normal judgment and thought content.     Assessment   31 y.o. G2P1001 at [redacted]w[redacted]d by  05/27/2017, by Ultrasound presenting for routine prenatal visit  Plan   pregnancy Problems (from 10/12/16 to present)    Problem Noted Resolved   Chronic hypertension during pregnancy, antepartum 11/16/2016 by Conard Novak, MD No   Family history of congenital heart disease  by Katrina Stack No   Adult BMI 40.0-44.9 kg/sq m (HCC) 10/26/2016 by Farrel Conners, CNM No   Supervision of high risk pregnancy, antepartum 10/12/2016 by Tresea Mall, CNM No   Overview Addendum 10/26/2016  5:30 PM by Farrel Conners, CNM    Clinic [facility]  Dating 9wk 4d ultrasound Blood type:   B POS  Genetic Screen 1 Screen:    AFP:     Quad:     NIPS: Antibody: neg  Anatomic [place]  Rubella:   Immune Varicella:   immune  GTT Early:               Third trimester: 88 RPR:   negative  Rhogam  HBsAg:   non reactive  TDaP vaccine                       Flu Shot: HIV:   negative  Baby Food                                GBS:   Contraception  Pap:  CBB     CS/VBAC    Support Person              Severe obesity (BMI >= 40) (HCC) 02/08/2015  by Conard NovakJackson, Andreya Lacks D, MD No   Hypothyroidism 02/08/2015 by Conard NovakJackson, Timberlyn Pickford D, MD No   Maternal obesity, antepartum 10/26/2014 by Kirby FunkEllestad, Sarah, MD No       Please refer to After Visit Summary for other counseling recommendations.   Return in about 4 weeks (around 12/14/2016) for Routine Prenatal Appointment.  Thomasene MohairStephen Demiana Crumbley, MD  11/16/2016 11:52 AM

## 2016-11-18 LAB — FIRST TRIMESTER SCREEN W/NT
CRL: 66.1 mm
DIA MOM: 1.58
DIA VALUE: 279.2 pg/mL
Gest Age-Collect: 12.7 weeks
Maternal Age At EDD: 30.1 yr
Nuchal Translucency MoM: 1.03
Nuchal Translucency: 1.7 mm
Number of Fetuses: 1
PAPP-A MoM: 2.3
PAPP-A VALUE: 1261.3 ng/mL
Test Results:: NEGATIVE
Weight: 245 [lb_av]
hCG MoM: 1.21
hCG Value: 76.9 IU/mL

## 2016-11-22 ENCOUNTER — Telehealth: Payer: Self-pay

## 2016-11-22 NOTE — Telephone Encounter (Signed)
Pt has 24hr urine collected but hasn't taken it to get processed d/t not getting off until 5pm every day.  Does she need to do it over or can she bring it whenever, or can  hsb bring it?  [phone]  Pt finished collecting on the 17th.  Adv to do it over and to time it to where it can be brought to office before 4pm to be processed and to keep refrigerated.

## 2016-11-23 ENCOUNTER — Telehealth: Payer: Self-pay

## 2016-11-23 NOTE — Telephone Encounter (Signed)
Pt needs note for dentist. Will p/u tomorrow. [phone] Pt aware to ask receptionist for it.

## 2016-11-24 ENCOUNTER — Other Ambulatory Visit: Payer: Self-pay | Admitting: Obstetrics and Gynecology

## 2016-11-25 LAB — PROTEIN, URINE, 24 HOUR
Protein, 24H Urine: 257 mg/24 hr — ABNORMAL HIGH (ref 30–150)
Protein, Ur: 15.6 mg/dL

## 2016-11-30 DIAGNOSIS — O1210 Gestational proteinuria, unspecified trimester: Secondary | ICD-10-CM | POA: Insufficient documentation

## 2016-12-14 ENCOUNTER — Ambulatory Visit (INDEPENDENT_AMBULATORY_CARE_PROVIDER_SITE_OTHER): Payer: BLUE CROSS/BLUE SHIELD | Admitting: Obstetrics & Gynecology

## 2016-12-14 VITALS — BP 120/80 | Wt 250.0 lb

## 2016-12-14 DIAGNOSIS — O99210 Obesity complicating pregnancy, unspecified trimester: Secondary | ICD-10-CM

## 2016-12-14 DIAGNOSIS — Z3A16 16 weeks gestation of pregnancy: Secondary | ICD-10-CM

## 2016-12-14 DIAGNOSIS — O0990 Supervision of high risk pregnancy, unspecified, unspecified trimester: Secondary | ICD-10-CM

## 2016-12-14 NOTE — Patient Instructions (Signed)

## 2016-12-14 NOTE — Progress Notes (Signed)
PNV, US nv.  Min nausea.

## 2017-01-11 ENCOUNTER — Ambulatory Visit (INDEPENDENT_AMBULATORY_CARE_PROVIDER_SITE_OTHER): Payer: BLUE CROSS/BLUE SHIELD

## 2017-01-11 ENCOUNTER — Ambulatory Visit (INDEPENDENT_AMBULATORY_CARE_PROVIDER_SITE_OTHER): Payer: BLUE CROSS/BLUE SHIELD | Admitting: Obstetrics and Gynecology

## 2017-01-11 DIAGNOSIS — O0990 Supervision of high risk pregnancy, unspecified, unspecified trimester: Secondary | ICD-10-CM

## 2017-01-11 DIAGNOSIS — Z362 Encounter for other antenatal screening follow-up: Secondary | ICD-10-CM

## 2017-01-11 DIAGNOSIS — Z3A16 16 weeks gestation of pregnancy: Secondary | ICD-10-CM | POA: Diagnosis not present

## 2017-01-11 DIAGNOSIS — Z23 Encounter for immunization: Secondary | ICD-10-CM

## 2017-01-11 DIAGNOSIS — O10919 Unspecified pre-existing hypertension complicating pregnancy, unspecified trimester: Secondary | ICD-10-CM

## 2017-01-11 NOTE — Progress Notes (Signed)
ROB with Anatomy U/S Refill Zantac Its a GIRL!

## 2017-01-11 NOTE — Progress Notes (Signed)
Routine Prenatal Care Visit  Subjective  Debra Bender is a 31 y.o. G2P1001 at [redacted]w[redacted]d being seen today for ongoing prenatal care.  She is currently monitored for the following issues for this high-risk pregnancy and has Maternal obesity, antepartum; Severe obesity (BMI >= 40) (HCC); Hypothyroidism; History of thyroid disorder; Gastroesophageal reflux disease without esophagitis; Essential hypertension; Supervision of high risk pregnancy, antepartum; Adult BMI 40.0-44.9 kg/sq m (HCC); Family history of congenital heart disease; Chronic hypertension during pregnancy, antepartum; and Proteinuria affecting pregnancy on her problem list.  ----------------------------------------------------------------------------------- Patient reports no complaints.   Contractions: Not present. Vag. Bleeding: None.  Movement: Present. Denies leaking of fluid.  ----------------------------------------------------------------------------------- The following portions of the patient's history were reviewed and updated as appropriate: allergies, current medications, past family history, past medical history, past social history, past surgical history and problem list. Problem list updated.   Objective  Blood pressure 118/80, weight 255 lb (115.7 kg), last menstrual period 08/11/2016, unknown if currently breastfeeding. Pregravid weight 240 lb (108.9 kg) Total Weight Gain 15 lb (6.804 kg) Urinalysis:      Fetal Status: Fetal Heart Rate (bpm): 140   Movement: Present     General:  Alert, oriented and cooperative. Patient is in no acute distress.  Skin: Skin is warm and dry. No rash noted.   Cardiovascular: Normal heart rate noted  Respiratory: Normal respiratory effort, no problems with respiration noted  Abdomen: Soft, gravid, appropriate for gestational age. Pain/Pressure: Absent     Pelvic:  Cervical exam deferred        Extremities: Normal range of motion.     ental Status: Normal mood and affect. Normal  behavior. Normal judgment and thought content.   Immunization History  Administered Date(s) Administered  . Influenza,inj,Quad PF,6+ Mos 01/11/2017     Assessment   31 y.o. G2P1001 at [redacted]w[redacted]d by  05/27/2017, by Ultrasound presenting for routine prenatal visit  Plan   pregnancy Problems (from 10/12/16 to present)    Problem Noted Resolved   Proteinuria affecting pregnancy 11/30/2016 by Conard Novak, MD No   Overview Signed 11/30/2016 10:29 AM by Conard Novak, MD    Initial urine p:c ratio = 328 24 hour urine protein = 257.    continue to monitor      Chronic hypertension during pregnancy, antepartum 11/16/2016 by Conard Novak, MD No   Overview Signed 11/18/2016  3:09 PM by Farrel Conners, CNM    ASA 81 mgm daily, monthly ultrasounds for growth in third trimester, weekly antepartum testing at 32 weeks, and twice weekly testing at 34 weeks with delivery no later than 39 weeks.      Family history of congenital heart disease  by Katrina Stack No   Overview Signed 11/18/2016  1:11 PM by Farrel Conners, CNM    Seen by genetic counselor. Fetal echo scheduled      Adult BMI 40.0-44.9 kg/sq m (HCC) 10/26/2016 by Farrel Conners, CNM No   Supervision of high risk pregnancy, antepartum 10/12/2016 by Tresea Mall, CNM No   Overview Addendum 11/30/2016 10:28 AM by Conard Novak, MD    Clinic [facility]  Dating 9wk 4d ultrasound Blood type: B/Positive/-- (07/25 4098) B POS  Genetic Screen 1 Screen: neg     AFP:   ask @ 16-18 wks    Antibody:Negative (07/25 0952)neg  Anatomic [place]  Rubella: 1.50 (07/25 0952) Immune Varicella:   immune  GTT Early: 88 Third trimester:  RPR: Non Reactive (  07/25 5284) negative  Rhogam  HBsAg: Negative (07/25 0952) non reactive  TDaP vaccine                       Flu Shot: HIV:   negative  Baby Food                                GBS:   Contraception  Pap:  CBB     CS/VBAC    Support Person        High risk  due to [facility], hx of preeclampsia, morbid obesity, family hx of congenital heart disease, borderline hypothyroidism       Severe obesity (BMI >= 40) (HCC) 02/08/2015 by Conard Novak, MD No   Hypothyroidism 02/08/2015 by Conard Novak, MD No   Overview Signed 11/18/2016  1:19 PM by Farrel Conners, CNM    Repeat TSH and free t4 at 16-18 weeks-if TSH higher or free T4 lower, start 50 mgm Synthroid      Maternal obesity, antepartum 10/26/2014 by Kirby Funk, MD No       Preterm labor symptoms and general obstetric precautions including but not limited to vaginal bleeding, contractions, leaking of fluid and fetal movement were reviewed in detail with the patient. Please refer to After Visit Summary for other counseling recommendations.   - influenza vaccination today  Return in about 4 weeks (around 02/08/2017) for ROB.

## 2017-02-08 ENCOUNTER — Ambulatory Visit (INDEPENDENT_AMBULATORY_CARE_PROVIDER_SITE_OTHER): Payer: BLUE CROSS/BLUE SHIELD | Admitting: Certified Nurse Midwife

## 2017-02-08 VITALS — BP 124/80 | Wt 260.0 lb

## 2017-02-08 DIAGNOSIS — O10919 Unspecified pre-existing hypertension complicating pregnancy, unspecified trimester: Secondary | ICD-10-CM

## 2017-02-08 DIAGNOSIS — O0990 Supervision of high risk pregnancy, unspecified, unspecified trimester: Secondary | ICD-10-CM

## 2017-02-08 DIAGNOSIS — O99210 Obesity complicating pregnancy, unspecified trimester: Secondary | ICD-10-CM

## 2017-02-08 DIAGNOSIS — Z3A24 24 weeks gestation of pregnancy: Secondary | ICD-10-CM

## 2017-02-08 DIAGNOSIS — Z8639 Personal history of other endocrine, nutritional and metabolic disease: Secondary | ICD-10-CM

## 2017-02-08 NOTE — Progress Notes (Signed)
ROB Vaginal pressure, some braxton hicks

## 2017-02-08 NOTE — Progress Notes (Signed)
Routine Prenatal Care Visit  Subjective  Debra Bender is a 31 y.o. G2P1001 at 7349w4d being seen today for ongoing prenatal care.  She is currently monitored for the following issues for this high-risk pregnancy and has Maternal obesity, antepartum; Severe obesity (BMI >= 40) (HCC); Hypothyroidism; History of thyroid disorder; Gastroesophageal reflux disease without esophagitis; Essential hypertension; Supervision of high risk pregnancy, antepartum; Adult BMI 40.0-44.9 kg/sq m (HCC); Family history of congenital heart disease; Chronic hypertension during pregnancy, antepartum; and Proteinuria affecting pregnancy on their problem list.  ----------------------------------------------------------------------------------- Patient reports good fetal movement.  Fetal echo was normal Wants to breast feed ----------------------------------------------------------------------------------- The following portions of the patient's history were reviewed and updated as appropriate: allergies, current medications, past family history, past medical history, past social history, past surgical history and problem list. Problem list updated.   Objective  Blood pressure 124/80, weight 260 lb (117.9 kg) Pregravid weight 240 lb (108.9 kg) Total Weight Gain 20 lb (9.072 kg) Urinalysis: Urine Protein: Negative Urine Glucose: Negative Fundal Height: 32 cm Fetal Status: FHR 131  General:  Alert, oriented and cooperative. Patient is in no acute distress.  Skin: Skin is warm and dry. No rash noted.   Cardiovascular:   Respiratory: Normal respiratory effort, no problems with respiration noted  Abdomen: Soft, gravid, obese      Pelvic: NA  Extremities: Normal range of motion.     Mental Status: Normal mood and affect. Normal behavior. Normal judgment and thought content.   Assessment   31 y.o. G2P1001 at 4849w4d by  05/27/2017 [facility], by Ultrasound presenting for routine prenatal visit  Plan   pregnancy Problems  (from 10/12/16 to present)    Problem Noted Resolved   Proteinuria affecting pregnancy 11/30/2016 by Conard NovakJackson, Stephen D, MD No   Overview Signed 11/30/2016 10:29 AM by Conard NovakJackson, Stephen D, MD    Initial urine p:c ratio = 328 24 hour urine protein = 257.   [ ]  continue to monitor      Chronic hypertension during pregnancy, antepartum 11/16/2016 by Conard NovakJackson, Stephen D, MD No   Overview Addendum 01/11/2017  1:33 PM by Vena AustriaStaebler, Andreas, MD    ASA 81 mgm daily, monthly ultrasounds for growth in third trimester, weekly antepartum testing at 32 weeks, and twice weekly testing at 34 weeks with delivery no later than 39 weeks.  [X]  baseline labs with CBC, CMP, urine protein/creatinine ratio Continues on Norvasc for hypertension [ ]  ultrasound for growth at 28, 32, 36 weeks   Current antihypertensives:  Norvasc   Baseline and surveillance labs [facility], refresh links as needed)  Lab Results  Component Value Date   PLT 277 10/25/2016   CREATININE 0.68 10/25/2016   AST 15 10/25/2016   ALT 9 10/25/2016   PROTCRRATIO 0.13 02/08/2015   PROTEIN24HR 257 (H) 11/24/2016    Antenatal Testing CHTN - O10.919  Group I  BP < 140/90, no preeclampsia, AGA,  nml AFV, +/- meds    Group II BP > 140/90, on meds, no preeclampsia, AGA, nml AFV  20-28-34-38  20-24-28-32-35-38  32//2 x wk  28//BPP wkly then 32//2 x wk  40 no meds; 39 meds  PRN or 37         Family history of congenital heart disease  by Katrina StackWells, Deborah No   Overview Signed 11/18/2016  1:11 PM by Farrel ConnersGutierrez, Damisha Wolff, CNM    Seen by genetic counselor. Fetal echo normal      Adult BMI 40.0-44.9 kg/sq m (HCC) 10/26/2016  by Farrel ConnersGutierrez, Tajanae Guilbault, CNM No   Supervision of high risk pregnancy, antepartum 10/12/2016 by Tresea MallGledhill, Jane, CNM No   Overview Addendum 01/11/2017  1:30 PM by Vena AustriaStaebler, Andreas, MD    Clinic [facility] Prenatal Labs  Dating 9wk 4d ultrasound Blood type: B/Positive/-- (07/25 [id number]) B POS  Genetic Screen 1 Screen:  neg      Antibody:Negative (07/25 0952)neg  Anatomic US Normal female Rubella: 1.50 (07/25 0952) Immune Varicella:   immune  GTT Early: 88 Third trimester:  RPR: Non Reactive (07/25 0952) negative  Rhogam  HBsAg: Negative (07/25 0952) non reactive  TDaP vaccine                       Flu Shot: 01/11/17 HIV:   negative  Baby Food           Breast                     GBS:   Contraception  Pap:10/12/16 NIL  CBB     CS/VBAC    Support Person        High risk due to CHTN, hx of preeclampsia, morbid obesity, family hx of congenital heart disease, borderline hypothyroidism       Severe obesity (BMI >= 40) (HCC) 02/08/2015 by Conard NovakJackson, Stephen D, MD No   Hypothyroidism 02/08/2015 by Conard NovakJackson, Stephen D, MD No   Overview Signed 11/18/2016  1:19 PM by Farrel ConnersGutierrez, Alaia Lordi, CNM    Repeat TSH and free t4 at 16-18 weeks-if TSH higher or free T4 lower, start 50 mgm Synthroid This was not done. Will get TSH and free T4 with 28 week labs in 4 weeks      Maternal obesity, antepartum 10/26/2014 by Kirby FunkEllestad, Sarah, MD No      RTO in 4 weeks for growth scan, 28 week labs with TSH and free T4, ROB.  Farrel ConnersColleen Alec Jaros, CNM  02/08/2017 11:44 AM

## 2017-03-09 ENCOUNTER — Ambulatory Visit (INDEPENDENT_AMBULATORY_CARE_PROVIDER_SITE_OTHER): Payer: BLUE CROSS/BLUE SHIELD

## 2017-03-09 ENCOUNTER — Ambulatory Visit (INDEPENDENT_AMBULATORY_CARE_PROVIDER_SITE_OTHER): Payer: BLUE CROSS/BLUE SHIELD | Admitting: Advanced Practice Midwife

## 2017-03-09 ENCOUNTER — Encounter: Payer: Self-pay | Admitting: Advanced Practice Midwife

## 2017-03-09 ENCOUNTER — Ambulatory Visit: Payer: BLUE CROSS/BLUE SHIELD

## 2017-03-09 VITALS — BP 118/72 | Wt 267.0 lb

## 2017-03-09 DIAGNOSIS — R51 Headache: Secondary | ICD-10-CM

## 2017-03-09 DIAGNOSIS — R519 Headache, unspecified: Secondary | ICD-10-CM

## 2017-03-09 DIAGNOSIS — O99210 Obesity complicating pregnancy, unspecified trimester: Secondary | ICD-10-CM

## 2017-03-09 DIAGNOSIS — Z3689 Encounter for other specified antenatal screening: Secondary | ICD-10-CM

## 2017-03-09 DIAGNOSIS — Z3A28 28 weeks gestation of pregnancy: Secondary | ICD-10-CM

## 2017-03-09 DIAGNOSIS — O0990 Supervision of high risk pregnancy, unspecified, unspecified trimester: Secondary | ICD-10-CM

## 2017-03-09 DIAGNOSIS — Z8639 Personal history of other endocrine, nutritional and metabolic disease: Secondary | ICD-10-CM

## 2017-03-09 MED ORDER — BUTALBITAL-APAP-CAFFEINE 50-325-40 MG PO CAPS: 1.0000 | ORAL_CAPSULE | Freq: Four times a day (QID) | ORAL | 3 refills | Status: DC | PRN

## 2017-03-09 NOTE — Progress Notes (Signed)
Routine Prenatal Care Visit  Subjective  Debra Bender is a 31 y.o. G2P1001 at 2249w5d being seen today for ongoing prenatal care.  She is currently monitored for the following issues for this high-risk pregnancy and has Maternal obesity, antepartum; Severe obesity (BMI >= 40) (HCC); Hypothyroidism; History of thyroid disorder; Gastroesophageal reflux disease without esophagitis; Essential hypertension; Supervision of high risk pregnancy, antepartum; Adult BMI 40.0-44.9 kg/sq m (HCC); Family history of congenital heart disease; Chronic hypertension during pregnancy, antepartum; and Proteinuria affecting pregnancy on their problem list.  ----------------------------------------------------------------------------------- Patient reports some pelvic pressure.  She has had a headache that tylenol has not helped.  Denies contractions. Denies vaginal bleeding. Denies leaking of fluid.  ----------------------------------------------------------------------------------- The following portions of the patient's history were reviewed and updated as appropriate: allergies, current medications, past family history, past medical history, past social history, past surgical history and problem list. Problem list updated.   Objective  Blood pressure 118/72, weight 267 lb (121.1 kg), last menstrual period 08/11/2016 Pregravid weight 240 lb (108.9 kg) Total Weight Gain 27 lb (12.2 kg) Urinalysis:      Fetal Status: fetal movement present Growth scan done today- unable to access report will notify Dr Jean RosenthalJackson of scan today. Patient unaware of any problems with u/s.  General:  Alert, oriented and cooperative. Patient is in no acute distress.  Skin: Skin is warm and dry. No rash noted.   Cardiovascular: Normal heart rate noted  Respiratory: Normal respiratory effort, no problems with respiration noted  Abdomen: Soft, gravid, appropriate for gestational age.       Pelvic:  Cervical exam deferred          Extremities: Normal range of motion.     Mental Status: Normal mood and affect. Normal behavior. Normal judgment and thought content.   Assessment   31 y.o. G2P1001 at 4049w5d by  05/27/2017, by Ultrasound presenting for routine prenatal visit  Plan   pregnancy Problems (from 10/12/16 to present)    Problem Noted Resolved   Proteinuria affecting pregnancy 11/30/2016 by Conard NovakJackson, Stephen D, MD No   Overview Signed 11/30/2016 10:29 AM by Conard NovakJackson, Stephen D, MD    Initial urine p:c ratio = 328 24 hour urine protein = 257.   [ ]  continue to monitor      Chronic hypertension during pregnancy, antepartum 11/16/2016 by Conard NovakJackson, Stephen D, MD No   Overview Addendum 01/11/2017  1:33 PM by Vena AustriaStaebler, Andreas, MD    ASA 81 mgm daily, monthly ultrasounds for growth in third trimester, weekly antepartum testing at 32 weeks, and twice weekly testing at 34 weeks with delivery no later than 39 weeks.  [X]  baseline labs with CBC, CMP, urine protein/creatinine ratio [ ]  no BP meds unless BPs become elevated [ ]  ultrasound for growth at 28 [x] , 32, 36 weeks   Current antihypertensives:  Norvasc   Baseline and surveillance labs [facility], refresh links as needed)  Lab Results  Component Value Date   PLT 277 10/25/2016   CREATININE 0.68 10/25/2016   AST 15 10/25/2016   ALT 9 10/25/2016   PROTCRRATIO 0.13 02/08/2015   PROTEIN24HR 257 (H) 11/24/2016    Antenatal Testing CHTN - O10.919  Group I  BP < 140/90, no preeclampsia, AGA,  nml AFV, +/- meds    Group II BP > 140/90, on meds, no preeclampsia, AGA, nml AFV  20-28-34-38  20-24-28-32-35-38  32//2 x wk  28//BPP wkly then 32//2 x wk  40 no meds; 39 meds  PRN or 37         Family history of congenital heart disease  by Katrina StackWells, Deborah No   Overview Signed 11/18/2016  1:11 PM by Farrel ConnersGutierrez, Colleen, CNM    Seen by genetic counselor. Fetal echo scheduled      Adult BMI 40.0-44.9 kg/sq m (HCC) 10/26/2016 by Farrel ConnersGutierrez, Colleen,  CNM No   Supervision of high risk pregnancy, antepartum 10/12/2016 by Tresea MallGledhill, Amerika Nourse, CNM No   Overview Addendum 01/11/2017  1:30 PM by Vena AustriaStaebler, Andreas, MD    Clinic [facility] Prenatal Labs  Dating 9wk 4d ultrasound Blood type: B/Positive/-- (07/25 0952) B POS  Genetic Screen 1 Screen: neg      Antibody:Negative (07/25 0952)neg  Anatomic US Normal female Rubella: 1.50 (07/25 0952) Immune Varicella:   immune  GTT Early: 88 Third trimester:  RPR: Non Reactive (07/25 0952) negative  Rhogam  HBsAg: Negative (07/25 0952) non reactive  TDaP vaccine                       Flu Shot: 01/11/17 HIV:   negative  Baby Food                                GBS:   Contraception  Pap:10/12/16 NIL  CBB     CS/VBAC    Support Person        High risk due to CHTN, hx of preeclampsia, morbid obesity, family hx of congenital heart disease, borderline hypothyroidism       Severe obesity (BMI >= 40) (HCC) 02/08/2015 by Conard NovakJackson, Stephen D, MD No   Hypothyroidism 02/08/2015 by Conard NovakJackson, Stephen D, MD No   Overview Signed 11/18/2016  1:19 PM by Farrel ConnersGutierrez, Colleen, CNM    Repeat TSH and free t4 at 16-18 weeks-if TSH higher or free T4 lower, start 50 mcg Synthroid TFTs done today      Maternal obesity, antepartum 10/26/2014 by Kirby FunkEllestad, Sarah, MD No       Preterm labor symptoms and general obstetric precautions including but not limited to vaginal bleeding, contractions, leaking of fluid and fetal movement were reviewed in detail with the patient. Please refer to After Visit Summary for other counseling recommendations.  Rx for fioricet  Return in about 2 weeks (around 03/23/2017) for rob.  Tresea MallJane Neshia Mckenzie, CNM  03/09/2017 5:19 PM

## 2017-03-09 NOTE — Progress Notes (Signed)
C/o headache x3 days. No relief w/Tylenol & Benadryl

## 2017-03-09 NOTE — Patient Instructions (Signed)
Third Trimester of Pregnancy The third trimester is from week 28 through week 40 (months 7 through 9). The third trimester is a time when the unborn baby (fetus) is growing rapidly. At the end of the ninth month, the fetus is about 20 inches in length and weighs 6-10 pounds. Body changes during your third trimester Your body will continue to go through many changes during pregnancy. The changes vary from woman to woman. During the third trimester:  Your weight will continue to increase. You can expect to gain 25-35 pounds (11-16 kg) by the end of the pregnancy.  You may begin to get stretch marks on your hips, abdomen, and breasts.  You may urinate more often because the fetus is moving lower into your pelvis and pressing on your bladder.  You may develop or continue to have heartburn. This is caused by increased hormones that slow down muscles in the digestive tract.  You may develop or continue to have constipation because increased hormones slow digestion and cause the muscles that push waste through your intestines to relax.  You may develop hemorrhoids. These are swollen veins (varicose veins) in the rectum that can itch or be painful.  You may develop swollen, bulging veins (varicose veins) in your legs.  You may have increased body aches in the pelvis, back, or thighs. This is due to weight gain and increased hormones that are relaxing your joints.  You may have changes in your hair. These can include thickening of your hair, rapid growth, and changes in texture. Some women also have hair loss during or after pregnancy, or hair that feels dry or thin. Your hair will most likely return to normal after your baby is born.  Your breasts will continue to grow and they will continue to become tender. A yellow fluid (colostrum) may leak from your breasts. This is the first milk you are producing for your baby.  Your belly button may stick out.  You may notice more swelling in your hands,  face, or ankles.  You may have increased tingling or numbness in your hands, arms, and legs. The skin on your belly may also feel numb.  You may feel short of breath because of your expanding uterus.  You may have more problems sleeping. This can be caused by the size of your belly, increased need to urinate, and an increase in your body's metabolism.  You may notice the fetus "dropping," or moving lower in your abdomen (lightening).  You may have increased vaginal discharge.  You may notice your joints feel loose and you may have pain around your pelvic bone.  What to expect at prenatal visits You will have prenatal exams every 2 weeks until week 36. Then you will have weekly prenatal exams. During a routine prenatal visit:  You will be weighed to make sure you and the baby are growing normally.  Your blood pressure will be taken.  Your abdomen will be measured to track your baby's growth.  The fetal heartbeat will be listened to.  Any test results from the previous visit will be discussed.  You may have a cervical check near your due date to see if your cervix has softened or thinned (effaced).  You will be tested for Group B streptococcus. This happens between 35 and 37 weeks.  Your health care provider may ask you:  What your birth plan is.  How you are feeling.  If you are feeling the baby move.  If you have had   any abnormal symptoms, such as leaking fluid, bleeding, severe headaches, or abdominal cramping.  If you are using any tobacco products, including cigarettes, chewing tobacco, and electronic cigarettes.  If you have any questions.  Other tests or screenings that may be performed during your third trimester include:  Blood tests that check for low iron levels (anemia).  Fetal testing to check the health, activity level, and growth of the fetus. Testing is done if you have certain medical conditions or if there are problems during the  pregnancy.  Nonstress test (NST). This test checks the health of your baby to make sure there are no signs of problems, such as the baby not getting enough oxygen. During this test, a belt is placed around your belly. The baby is made to move, and its heart rate is monitored during movement.  What is false labor? False labor is a condition in which you feel small, irregular tightenings of the muscles in the womb (contractions) that usually go away with rest, changing position, or drinking water. These are called Braxton Hicks contractions. Contractions may last for hours, days, or even weeks before true labor sets in. If contractions come at regular intervals, become more frequent, increase in intensity, or become painful, you should see your health care provider. What are the signs of labor?  Abdominal cramps.  Regular contractions that start at 10 minutes apart and become stronger and more frequent with time.  Contractions that start on the top of the uterus and spread down to the lower abdomen and back.  Increased pelvic pressure and dull back pain.  A watery or bloody mucus discharge that comes from the vagina.  Leaking of amniotic fluid. This is also known as your "water breaking." It could be a slow trickle or a gush. Let your health care provider know if it has a color or strange odor. If you have any of these signs, call your health care provider right away, even if it is before your due date. Follow these instructions at home: Medicines  Follow your health care provider's instructions regarding medicine use. Specific medicines may be either safe or unsafe to take during pregnancy.  Take a prenatal vitamin that contains at least 600 micrograms (mcg) of folic acid.  If you develop constipation, try taking a stool softener if your health care provider approves. Eating and drinking  Eat a balanced diet that includes fresh fruits and vegetables, whole grains, good sources of protein  such as meat, eggs, or tofu, and low-fat dairy. Your health care provider will help you determine the amount of weight gain that is right for you.  Avoid raw meat and uncooked cheese. These carry germs that can cause birth defects in the baby.  If you have low calcium intake from food, talk to your health care provider about whether you should take a daily calcium supplement.  Eat four or five small meals rather than three large meals a day.  Limit foods that are high in fat and processed sugars, such as fried and sweet foods.  To prevent constipation: ? Drink enough fluid to keep your urine clear or pale yellow. ? Eat foods that are high in fiber, such as fresh fruits and vegetables, whole grains, and beans. Activity  Exercise only as directed by your health care provider. Most women can continue their usual exercise routine during pregnancy. Try to exercise for 30 minutes at least 5 days a week. Stop exercising if you experience uterine contractions.  Avoid heavy   lifting.  Do not exercise in extreme heat or humidity, or at high altitudes.  Wear low-heel, comfortable shoes.  Practice good posture.  You may continue to have sex unless your health care provider tells you otherwise. Relieving pain and discomfort  Take frequent breaks and rest with your legs elevated if you have leg cramps or low back pain.  Take warm sitz baths to soothe any pain or discomfort caused by hemorrhoids. Use hemorrhoid cream if your health care provider approves.  Wear a good support bra to prevent discomfort from breast tenderness.  If you develop varicose veins: ? Wear support pantyhose or compression stockings as told by your healthcare provider. ? Elevate your feet for 15 minutes, 3-4 times a day. Prenatal care  Write down your questions. Take them to your prenatal visits.  Keep all your prenatal visits as told by your health care provider. This is important. Safety  Wear your seat belt at  all times when driving.  Make a list of emergency phone numbers, including numbers for family, friends, the hospital, and police and fire departments. General instructions  Avoid cat litter boxes and soil used by cats. These carry germs that can cause birth defects in the baby. If you have a cat, ask someone to clean the litter box for you.  Do not travel far distances unless it is absolutely necessary and only with the approval of your health care provider.  Do not use hot tubs, steam rooms, or saunas.  Do not drink alcohol.  Do not use any products that contain nicotine or tobacco, such as cigarettes and e-cigarettes. If you need help quitting, ask your health care provider.  Do not use any medicinal herbs or unprescribed drugs. These chemicals affect the formation and growth of the baby.  Do not douche or use tampons or scented sanitary pads.  Do not cross your legs for long periods of time.  To prepare for the arrival of your baby: ? Take prenatal classes to understand, practice, and ask questions about labor and delivery. ? Make a trial run to the hospital. ? Visit the hospital and tour the maternity area. ? Arrange for maternity or paternity leave through employers. ? Arrange for family and friends to take care of pets while you are in the hospital. ? Purchase a rear-facing car seat and make sure you know how to install it in your car. ? Pack your hospital bag. ? Prepare the baby's nursery. Make sure to remove all pillows and stuffed animals from the baby's crib to prevent suffocation.  Visit your dentist if you have not gone during your pregnancy. Use a soft toothbrush to brush your teeth and be gentle when you floss. Contact a health care provider if:  You are unsure if you are in labor or if your water has broken.  You become dizzy.  You have mild pelvic cramps, pelvic pressure, or nagging pain in your abdominal area.  You have lower back pain.  You have persistent  nausea, vomiting, or diarrhea.  You have an unusual or bad smelling vaginal discharge.  You have pain when you urinate. Get help right away if:  Your water breaks before 37 weeks.  You have regular contractions less than 5 minutes apart before 37 weeks.  You have a fever.  You are leaking fluid from your vagina.  You have spotting or bleeding from your vagina.  You have severe abdominal pain or cramping.  You have rapid weight loss or weight gain.    You have shortness of breath with chest pain.  You notice sudden or extreme swelling of your face, hands, ankles, feet, or legs.  Your baby makes fewer than 10 movements in 2 hours.  You have severe headaches that do not go away when you take medicine.  You have vision changes. Summary  The third trimester is from week 28 through week 40, months 7 through 9. The third trimester is a time when the unborn baby (fetus) is growing rapidly.  During the third trimester, your discomfort may increase as you and your baby continue to gain weight. You may have abdominal, leg, and back pain, sleeping problems, and an increased need to urinate.  During the third trimester your breasts will keep growing and they will continue to become tender. A yellow fluid (colostrum) may leak from your breasts. This is the first milk you are producing for your baby.  False labor is a condition in which you feel small, irregular tightenings of the muscles in the womb (contractions) that eventually go away. These are called Braxton Hicks contractions. Contractions may last for hours, days, or even weeks before true labor sets in.  Signs of labor can include: abdominal cramps; regular contractions that start at 10 minutes apart and become stronger and more frequent with time; watery or bloody mucus discharge that comes from the vagina; increased pelvic pressure and dull back pain; and leaking of amniotic fluid. This information is not intended to replace advice  given to you by your health care provider. Make sure you discuss any questions you have with your health care provider. Document Released: 03/14/2001 Document Revised: 08/26/2015 Document Reviewed: 05/21/2012 [employer] Interactive Patient Education  2017 [employer]  

## 2017-03-10 LAB — TSH+FREE T4
FREE T4: 1 ng/dL (ref 0.82–1.77)
TSH: 3.17 u[IU]/mL (ref 0.450–4.500)

## 2017-03-10 LAB — 28 WEEK RH+PANEL
BASOS ABS: 0 10*3/uL (ref 0.0–0.2)
Basos: 0 %
EOS (ABSOLUTE): 0.2 10*3/uL (ref 0.0–0.4)
EOS: 2 %
Gestational Diabetes Screen: 83 mg/dL (ref 65–139)
HEMATOCRIT: 33.1 % — AB (ref 34.0–46.6)
HEMOGLOBIN: 10.7 g/dL — AB (ref 11.1–15.9)
HIV SCREEN 4TH GENERATION: NONREACTIVE
IMMATURE GRANS (ABS): 0.2 10*3/uL — AB (ref 0.0–0.1)
Immature Granulocytes: 1 %
LYMPHS ABS: 2.8 10*3/uL (ref 0.7–3.1)
LYMPHS: 21 %
MCH: 28.1 pg (ref 26.6–33.0)
MCHC: 32.3 g/dL (ref 31.5–35.7)
MCV: 87 fL (ref 79–97)
MONOCYTES: 6 %
Monocytes Absolute: 0.8 10*3/uL (ref 0.1–0.9)
NEUTROS ABS: 9 10*3/uL — AB (ref 1.4–7.0)
Neutrophils: 70 %
Platelets: 269 10*3/uL (ref 150–379)
RBC: 3.81 x10E6/uL (ref 3.77–5.28)
RDW: 14.9 % (ref 12.3–15.4)
RPR Ser Ql: NONREACTIVE
WBC: 12.9 10*3/uL — AB (ref 3.4–10.8)

## 2017-03-18 ENCOUNTER — Other Ambulatory Visit: Payer: Self-pay | Admitting: Certified Nurse Midwife

## 2017-03-23 ENCOUNTER — Ambulatory Visit (INDEPENDENT_AMBULATORY_CARE_PROVIDER_SITE_OTHER): Payer: BLUE CROSS/BLUE SHIELD | Admitting: Obstetrics and Gynecology

## 2017-03-23 VITALS — BP 116/70 | Wt 273.0 lb

## 2017-03-23 DIAGNOSIS — Z23 Encounter for immunization: Secondary | ICD-10-CM | POA: Diagnosis not present

## 2017-03-23 DIAGNOSIS — O99210 Obesity complicating pregnancy, unspecified trimester: Secondary | ICD-10-CM

## 2017-03-23 DIAGNOSIS — O0990 Supervision of high risk pregnancy, unspecified, unspecified trimester: Secondary | ICD-10-CM

## 2017-03-23 DIAGNOSIS — O1211 Gestational proteinuria, first trimester: Secondary | ICD-10-CM

## 2017-03-23 DIAGNOSIS — Z3A30 30 weeks gestation of pregnancy: Secondary | ICD-10-CM

## 2017-03-23 DIAGNOSIS — O10919 Unspecified pre-existing hypertension complicating pregnancy, unspecified trimester: Secondary | ICD-10-CM

## 2017-03-23 NOTE — Progress Notes (Signed)
ROB Braxton Hicks TDAP given

## 2017-03-23 NOTE — Progress Notes (Signed)
Routine Prenatal Care Visit  Subjective  Debra Bender is a 31 y.o. G2P1001 at 3272w5d being seen today for ongoing prenatal care.  She is currently monitored for the following issues for this high-risk pregnancy and has Maternal obesity, antepartum; Severe obesity (BMI >= 40) (HCC); Hypothyroidism; History of thyroid disorder; Gastroesophageal reflux disease without esophagitis; Essential hypertension; Supervision of high risk pregnancy, antepartum; Adult BMI 40.0-44.9 kg/sq m (HCC); Family history of congenital heart disease; Chronic hypertension during pregnancy, antepartum; and Proteinuria affecting pregnancy on their problem list.  ----------------------------------------------------------------------------------- Patient reports no complaints.   Contractions: Not present. Vag. Bleeding: None.  Movement: Present. Denies leaking of fluid.  ----------------------------------------------------------------------------------- The following portions of the patient's history were reviewed and updated as appropriate: allergies, current medications, past family history, past medical history, past social history, past surgical history and problem list. Problem list updated.   Objective  Blood pressure 116/70, weight 273 lb (123.8 kg), last menstrual period 08/11/2016, unknown if currently breastfeeding. Pregravid weight 240 lb (108.9 kg) Total Weight Gain 33 lb (15 kg) Urinalysis: Urine Protein: Trace Urine Glucose: Negative  Fetal Status: Fetal Heart Rate (bpm): 140 Fundal Height: 35 cm Movement: Present     General:  Alert, oriented and cooperative. Patient is in no acute distress.  Skin: Skin is warm and dry. No rash noted.   Cardiovascular: Normal heart rate noted  Respiratory: Normal respiratory effort, no problems with respiration noted  Abdomen: Soft, gravid, appropriate for gestational age. Pain/Pressure: Present     Pelvic:  Cervical exam deferred        Extremities: Normal range  of motion.     ental Status: Normal mood and affect. Normal behavior. Normal judgment and thought content.     Assessment   31 y.o. G2P1001 at 6572w5d by  05/27/2017, by Ultrasound presenting for routine prenatal visit  Plan   pregnancy Problems (from 10/12/16 to present)    Problem Noted Resolved   Proteinuria affecting pregnancy 11/30/2016 by Conard NovakJackson, Stephen D, MD No   Overview Signed 11/30/2016 10:29 AM by Conard NovakJackson, Stephen D, MD    Initial urine p:c ratio = 328 24 hour urine protein = 257.   [ ]  continue to monitor      Chronic hypertension during pregnancy, antepartum 11/16/2016 by Conard NovakJackson, Stephen D, MD No   Overview Addendum 01/11/2017  1:33 PM by Vena AustriaStaebler, Dawt Reeb, MD    ASA 81 mgm daily, monthly ultrasounds for growth in third trimester, weekly antepartum testing at 32 weeks, and twice weekly testing at 34 weeks with delivery no later than 39 weeks.  [X]  baseline labs with CBC, CMP, urine protein/creatinine ratio [ ]  no BP meds unless BPs become elevated [ ]  ultrasound for growth at 28, 32, 36 weeks   Current antihypertensives:  Norvasc   [facility] and surveillance [facility] refresh links as needed)  Lab Results  Component Value Date   PLT 277 10/25/2016   CREATININE 0.68 10/25/2016   AST 15 10/25/2016   ALT 9 10/25/2016   PROTCRRATIO 0.13 02/08/2015   PROTEIN24HR 257 (H) 11/24/2016    Antenatal Testing CHTN - O10.919  Group I  BP < 140/90, no preeclampsia, AGA,  nml AFV, +/- meds    Group II BP > 140/90, on meds, no preeclampsia, AGA, nml AFV  20-28-34-38  20-24-28-32-35-38  32//2 x wk  28//BPP wkly then 32//2 x wk  40 no meds; 39 meds  PRN or 37  Family history of congenital heart disease  by Katrina StackWells, Deborah No   Overview Signed 11/18/2016  1:11 PM by Farrel ConnersGutierrez, Colleen, CNM    Seen by genetic counselor. Fetal echo scheduled      Adult BMI 40.0-44.9 kg/sq m (HCC) 10/26/2016 by Farrel ConnersGutierrez, Colleen, CNM No   Supervision of high  risk pregnancy, antepartum 10/12/2016 by Tresea MallGledhill, Jane, CNM No   Overview Addendum 03/13/2017 10:15 AM by Farrel ConnersGutierrez, Colleen, CNM    Clinic [facility] Prenatal Labs  Dating 9wk 4d ultrasound Blood type: B/Positive/-- (07/25 0952) B POS  Genetic Screen 1 Screen: neg      Antibody:Negative (07/25 0952)neg  Anatomic US Normal female Rubella: 1.50 (07/25 0952) Immune Varicella:   immune  GTT Early: 88 Third trimester: 83 RPR: Non Reactive (07/25 0952) negative  Rhogam  HBsAg: Negative (07/25 0952) non reactive  TDaP vaccine                       Flu Shot: 01/11/17 HIV:   negative  Baby Food                                GBS:   Contraception  Pap:10/12/16 NIL  CBB     CS/VBAC    Support Person        High risk due to CHTN, hx of preeclampsia, morbid obesity, family hx of congenital heart disease, borderline hypothyroidism       Severe obesity (BMI >= 40) (HCC) 02/08/2015 by Conard NovakJackson, Stephen D, MD No   Hypothyroidism 02/08/2015 by Conard NovakJackson, Stephen D, MD No   Overview Signed 11/18/2016  1:19 PM by Farrel ConnersGutierrez, Colleen, CNM    Repeat TSH and free t4 at 16-18 weeks-if TSH higher or free T4 lower, start 50 mgm Synthroid      Maternal obesity, antepartum 10/26/2014 by Kirby FunkEllestad, Sarah, MD No       Preterm labor symptoms and general obstetric precautions including but not limited to vaginal bleeding, contractions, leaking of fluid and fetal movement were reviewed in detail with the patient. Please refer to After Visit Summary for other counseling recommendations.  - start NST/AFI weekly at 32 weeks, twice weekly at 34 weeks - growth scan ordered  Return in about 2 weeks (around 04/06/2017) for ROB and NST/Growth scan in 2 weeks.

## 2017-04-03 NOTE — L&D Delivery Note (Signed)
Obstetrical Delivery Note   Date of Delivery:   05/21/2017 Primary OB:   [facility] Gestational Age/EDD: 3164w1d (Dated by 9wk4d ultrasound) Antepartum complications: CHTN, obesity  Delivered By:   Farrel Connersolleen Karo Rog, CNM  Delivery Type:   spontaneous vaginal delivery  Procedure Details:   CTSP due to increased pelvic pressure and pain and involuntary pushing. Cervix: C/C/+1 to +2. Mother pushed and delivered the fetal head. Turtle sign was identified and patient placed in McRobert's. When unable to deliver anterior shoulder, suprapubic pressure while employing steady gentle traction resulted in the delivery of the anterior shoulder after about 20 seconds. Baby cried after  tactile stimulation. Baby placed on mother's chest and after a delayed cord clamping, the cord was cut by the FOB. Baby placed skin to skni on mother's chest. Spontaneous delivery of intact placenta and 3 vessel cord. Mild uterine atony resolved with IV Pitocin, fundal massage and Cytotec 800 mcg PR.Periuethral laceration repaired with 3-0 Chromic for hemostasis under local (epidural no longer working) and with Robnel catheter in urethra.  Anesthesia:    epidural and local for repair Intrapartum complications: Shoulder Dystocia, fetal tachycardia after SROM GBS:    negative Laceration:    periurethral Episiotomy:    none Placenta:    Via active 3rd stage. To pathology: no Estimated Blood Loss:  400 ml Baby:    Liveborn female, Apgars 7/9, weight 8#13.8 oz    Farrel Connersolleen Mayia Megill, CNM

## 2017-04-05 ENCOUNTER — Ambulatory Visit (INDEPENDENT_AMBULATORY_CARE_PROVIDER_SITE_OTHER): Payer: BLUE CROSS/BLUE SHIELD | Admitting: Obstetrics and Gynecology

## 2017-04-05 ENCOUNTER — Ambulatory Visit (INDEPENDENT_AMBULATORY_CARE_PROVIDER_SITE_OTHER): Payer: BLUE CROSS/BLUE SHIELD

## 2017-04-05 ENCOUNTER — Encounter: Payer: Self-pay | Admitting: Obstetrics and Gynecology

## 2017-04-05 VITALS — BP 124/84 | Wt 276.0 lb

## 2017-04-05 DIAGNOSIS — Z3A30 30 weeks gestation of pregnancy: Secondary | ICD-10-CM | POA: Diagnosis not present

## 2017-04-05 DIAGNOSIS — O10919 Unspecified pre-existing hypertension complicating pregnancy, unspecified trimester: Secondary | ICD-10-CM

## 2017-04-05 DIAGNOSIS — Z6841 Body Mass Index (BMI) 40.0 and over, adult: Secondary | ICD-10-CM

## 2017-04-05 DIAGNOSIS — Z8639 Personal history of other endocrine, nutritional and metabolic disease: Secondary | ICD-10-CM

## 2017-04-05 DIAGNOSIS — Z8279 Family history of other congenital malformations, deformations and chromosomal abnormalities: Secondary | ICD-10-CM

## 2017-04-05 DIAGNOSIS — O0990 Supervision of high risk pregnancy, unspecified, unspecified trimester: Secondary | ICD-10-CM

## 2017-04-05 DIAGNOSIS — Z3A32 32 weeks gestation of pregnancy: Secondary | ICD-10-CM

## 2017-04-05 DIAGNOSIS — O99210 Obesity complicating pregnancy, unspecified trimester: Secondary | ICD-10-CM | POA: Diagnosis not present

## 2017-04-05 DIAGNOSIS — O1211 Gestational proteinuria, first trimester: Secondary | ICD-10-CM

## 2017-04-05 DIAGNOSIS — O1210 Gestational proteinuria, unspecified trimester: Secondary | ICD-10-CM

## 2017-04-05 DIAGNOSIS — E039 Hypothyroidism, unspecified: Secondary | ICD-10-CM

## 2017-04-05 NOTE — Progress Notes (Signed)
Routine Prenatal Care Visit  Subjective  Debra Bender is a 32 y.o. G2P1001 at 2253w4d being seen today for ongoing prenatal care.  She is currently monitored for the following issues for this high-risk pregnancy and has Maternal obesity, antepartum; Severe obesity (BMI >= 40) (HCC); Hypothyroidism; History of thyroid disorder; Gastroesophageal reflux disease without esophagitis; Essential hypertension; Supervision of high risk pregnancy, antepartum; Adult BMI 40.0-44.9 kg/sq m (HCC); Family history of congenital heart disease; Chronic hypertension during pregnancy, antepartum; and Proteinuria affecting pregnancy on their problem list.  ----------------------------------------------------------------------------------- Patient reports occasional pelvic pressure, mild BH.   Contractions: Not present. Vag. Bleeding: None.  Movement: Present. Denies leaking of fluid.  Growth: 71st %ile, AC > 97th %ile (1h gtt @ 28 wks = 83), AFI: nml ----------------------------------------------------------------------------------- The following portions of the patient's history were reviewed and updated as appropriate: allergies, current medications, past family history, past medical history, past social history, past surgical history and problem list. Problem list updated.  Objective  Blood pressure 124/84, weight 276 lb (125.2 kg), last menstrual period 08/11/2016, unknown if currently breastfeeding. Pregravid weight 240 lb (108.9 kg) Total Weight Gain 36 lb (16.3 kg) Urinalysis:     Fetal Status: Fetal Heart Rate (bpm): 140   Movement: Present     General:  Alert, oriented and cooperative. Patient is in no acute distress.  Skin: Skin is warm and dry. No rash noted.   Cardiovascular: Normal heart rate noted  Respiratory: Normal respiratory effort, no problems with respiration noted  Abdomen: Soft, gravid, appropriate for gestational age. Pain/Pressure: Present     Pelvic:  Cervical exam deferred         Extremities: Normal range of motion.     Mental Status: Normal mood and affect. Normal behavior. Normal judgment and thought content.   NST Baseline FHR: 140 beats/min Variability: moderate Accelerations: present Decelerations: absent Tocometry: not done  Interpretation:  INDICATIONS: chronic hypertension RESULTS:  A NST procedure was performed with FHR monitoring and a normal baseline established, appropriate time of 20-40 minutes of evaluation, and accels >2 seen w 15x15 characteristics.  Results show a REACTIVE NST.     Assessment   32 y.o. G2P1001 at 2953w4d by  05/27/2017, by Ultrasound presenting for routine prenatal visit  Plan   pregnancy Problems (from 10/12/16 to present)    Problem Noted Resolved   Proteinuria affecting pregnancy 11/30/2016 by Conard NovakJackson, Suhaan Perleberg D, MD No   Overview Signed 11/30/2016 10:29 AM by Conard NovakJackson, Earnestene Angello D, MD    Initial urine p:c ratio = 328 24 hour urine protein = 257.   [ ]  continue to monitor      Chronic hypertension during pregnancy, antepartum 11/16/2016 by Conard NovakJackson, Cathan Gearin D, MD No   Overview Addendum 01/11/2017  1:33 PM by Vena AustriaStaebler, Andreas, MD    ASA 81 mgm daily, monthly ultrasounds for growth in third trimester, weekly antepartum testing at 32 weeks, and twice weekly testing at 34 weeks with delivery no later than 39 weeks.  [X]  baseline labs with CBC, CMP, urine protein/creatinine ratio [ ]  no BP meds unless BPs become elevated [ ]  ultrasound for growth at 28, 32, 36 weeks  Current antihypertensives:  Norvasc   Baseline and surveillance labs [facility], refresh links as needed)  Lab Results  Component Value Date   PLT 277 10/25/2016   CREATININE 0.68 10/25/2016   AST 15 10/25/2016   ALT 9 10/25/2016   PROTCRRATIO 0.13 02/08/2015   PROTEIN24HR 257 (H) 11/24/2016  Antenatal Testing CHTN - O10.919  Group I  BP < 140/90, no preeclampsia, AGA,  nml AFV, +/- meds    Group II BP > 140/90, on meds, no preeclampsia,  AGA, nml AFV  20-28-34-38  20-24-28-32-35-38  32//2 x wk  28//BPP wkly then 32//2 x wk  40 no meds; 39 meds  PRN or 37         Family history of congenital heart disease  by Katrina Stack No   Overview Signed 11/18/2016  1:11 PM by Farrel Conners, CNM    Seen by genetic counselor. Fetal echo scheduled      Adult BMI 40.0-44.9 kg/sq m (HCC) 10/26/2016 by Farrel Conners, CNM No   Supervision of high risk pregnancy, antepartum 10/12/2016 by Tresea Mall, CNM No   Overview Addendum 03/13/2017 10:15 AM by Farrel Conners, CNM    Clinic [facility] Prenatal Labs  Dating 9wk 4d ultrasound Blood type: B/Positive/-- (07/25 0952) B POS  Genetic Screen 1 Screen: neg      Antibody:Negative (07/25 0952)neg  Anatomic [place] Normal female Rubella: 1.50 (07/25 0952) Immune Varicella:   immune  GTT Early: 88 Third trimester: 83 RPR: Non Reactive (07/25 0952) negative  Rhogam  HBsAg: Negative (07/25 0952) non reactive  TDaP vaccine                       Flu Shot: 01/11/17 HIV:   negative  Baby Food                                GBS:   Contraception  Pap:10/12/16 NIL  CBB     CS/VBAC    Support Person        High risk due to CHTN, hx of preeclampsia, morbid obesity, family hx of congenital heart disease, borderline hypothyroidism      Severe obesity (BMI >= 40) (HCC) 02/08/2015 by Conard Novak, MD No   Hypothyroidism 02/08/2015 by Conard Novak, MD No   Overview Signed 11/18/2016  1:19 PM by Farrel Conners, CNM    Repeat TSH and free t4 at 16-18 weeks-if TSH higher or free T4 lower, start 50 mgm Synthroid      Maternal obesity, antepartum 10/26/2014 by Kirby Funk, MD No      Preterm labor symptoms and general obstetric precautions including but not limited to vaginal bleeding, contractions, leaking of fluid and fetal movement were reviewed in detail with the patient. Please refer to After Visit Summary for other counseling recommendations.   Return in about 1 week  (around 04/12/2017) for u/s for AFI with ROB and NST.  Thomasene Mohair, MD  04/05/2017 9:50 AM

## 2017-04-12 ENCOUNTER — Encounter: Payer: Self-pay | Admitting: Advanced Practice Midwife

## 2017-04-12 ENCOUNTER — Ambulatory Visit (INDEPENDENT_AMBULATORY_CARE_PROVIDER_SITE_OTHER): Payer: BLUE CROSS/BLUE SHIELD

## 2017-04-12 ENCOUNTER — Ambulatory Visit (INDEPENDENT_AMBULATORY_CARE_PROVIDER_SITE_OTHER): Payer: BLUE CROSS/BLUE SHIELD | Admitting: Advanced Practice Midwife

## 2017-04-12 VITALS — BP 118/78 | Wt 272.0 lb

## 2017-04-12 DIAGNOSIS — O0990 Supervision of high risk pregnancy, unspecified, unspecified trimester: Secondary | ICD-10-CM | POA: Diagnosis not present

## 2017-04-12 DIAGNOSIS — Z3A33 33 weeks gestation of pregnancy: Secondary | ICD-10-CM

## 2017-04-12 DIAGNOSIS — O10919 Unspecified pre-existing hypertension complicating pregnancy, unspecified trimester: Secondary | ICD-10-CM | POA: Diagnosis not present

## 2017-04-12 LAB — FETAL NONSTRESS TEST

## 2017-04-12 NOTE — Progress Notes (Signed)
Routine Prenatal Care Visit  Subjective  Debra Bender is a 32 y.o. G2P1001 at [redacted]w[redacted]d being seen today for ongoing prenatal care.  She is currently monitored for the following issues for this high-risk pregnancy and has Maternal obesity, antepartum; Severe obesity (BMI >= 40) (HCC); Hypothyroidism; History of thyroid disorder; Gastroesophageal reflux disease without esophagitis; Essential hypertension; Supervision of high risk pregnancy, antepartum; Adult BMI 40.0-44.9 kg/sq m (HCC); Family history of congenital heart disease; Chronic hypertension during pregnancy, antepartum; and Proteinuria affecting pregnancy on their problem list.  ----------------------------------------------------------------------------------- Patient reports a severe headache on Tuesday night that was relieved by Tylenol PM. She had a milder headache last night relieved by Tylenol. Discussion of her past history with hypertension, current condition including being on antihypertensive medication. There is no protein in her urine today.     Contractions: Not present.  .  Movement: Present. Denies leaking of fluid.  ----------------------------------------------------------------------------------- The following portions of the patient's history were reviewed and updated as appropriate: allergies, current medications, past family history, past medical history, past social history, past surgical history and problem list. Problem list updated.   Objective  Blood pressure 118/78, weight 272 lb (123.4 kg), last menstrual period 08/11/2016 Pregravid weight 240 lb (108.9 kg) Total Weight Gain 32 lb (14.5 kg) Urinalysis: Urine Protein: Negative Urine Glucose: Negative  Fetal Status: Fetal Heart Rate (bpm): 140   Movement: Present     AFI 13.36 NST reactive 20 minute strip today with baseline of 140, + accelerations to 180, - decelerations  General:  Alert, oriented and cooperative. Patient is in no acute distress.  Skin:  Skin is warm and dry. No rash noted.   Cardiovascular: Normal heart rate noted  Respiratory: Normal respiratory effort, no problems with respiration noted  Abdomen: Soft, gravid, appropriate for gestational age. Pain/Pressure: Present     Pelvic:  Cervical exam deferred        Extremities: Normal range of motion.     Mental Status: Normal mood and affect. Normal behavior. Normal judgment and thought content.   Assessment   32 y.o. G2P1001 at [redacted]w[redacted]d by  05/27/2017, by Ultrasound presenting for routine prenatal visit  Plan   pregnancy Problems (from 10/12/16 to present)    Problem Noted Resolved   Proteinuria affecting pregnancy 11/30/2016 by Conard Novak, MD No   Overview Signed 11/30/2016 10:29 AM by Conard Novak, MD    Initial urine p:c ratio = 328 24 hour urine protein = 257.   [ ]  continue to monitor      Chronic hypertension during pregnancy, antepartum 11/16/2016 by Conard Novak, MD No   Overview Addendum 01/11/2017  1:33 PM by Vena Austria, MD    ASA 81 mgm daily, monthly ultrasounds for growth in third trimester, weekly antepartum testing at 32 weeks, and twice weekly testing at 34 weeks with delivery no later than 39 weeks.  [X]  baseline labs with CBC, CMP, urine protein/creatinine ratio [ ]  no BP meds unless BPs become elevated [ ]  ultrasound for growth at 28, 32, 36 weeks   Current antihypertensives:  Norvasc   Baseline and surveillance labs (pulled in from [facility], refresh links as needed)  Lab Results  Component Value Date   PLT 277 10/25/2016   CREATININE 0.68 10/25/2016   AST 15 10/25/2016   ALT 9 10/25/2016   PROTCRRATIO 0.13 02/08/2015   PROTEIN24HR 257 (H) 11/24/2016    Antenatal Testing CHTN - O10.919  Group I  BP < 140/90, no preeclampsia, AGA,  nml AFV, +/- meds    Group II BP > 140/90, on meds, no preeclampsia, AGA, nml AFV  20-28-34-38  20-24-28-32-35-38  32//2 x wk  28//BPP wkly then 32//2 x wk  40 no meds; 39 meds  PRN  or 37         Family history of congenital heart disease  by Katrina StackWells, Deborah No   Overview Signed 11/18/2016  1:11 PM by Farrel ConnersGutierrez, Colleen, CNM    Seen by genetic counselor. Fetal echo scheduled      Adult BMI 40.0-44.9 kg/sq m (HCC) 10/26/2016 by Farrel ConnersGutierrez, Colleen, CNM No   Supervision of high risk pregnancy, antepartum 10/12/2016 by Tresea MallGledhill, Kalisha Keadle, CNM No   Overview Addendum 04/12/2017 10:44 AM by Tresea MallGledhill, Ziyanna Tolin, CNM    Clinic [facility] Prenatal Labs  Dating 9wk 4d ultrasound Blood type: B/Positive/-- (07/25 0952) B POS  Genetic Screen 1 Screen: neg      Antibody:Negative (07/25 0952)neg  Anatomic US Normal female Rubella: 1.50 (07/25 0952) Immune Varicella:   immune  GTT Early: 88 Third trimester: 83 RPR: Non Reactive (07/25 0952) negative  Rhogam  HBsAg: Negative (07/25 0952) non reactive  TDaP vaccine                       Flu Shot: 01/11/17 HIV:   negative  Baby Food                                GBS:   Contraception  Pap:10/12/16 NIL  CBB     CS/VBAC    Support Person Husband Terry       High risk due to [place], hx of preeclampsia, morbid obesity, family hx of congenital heart disease, borderline hypothyroidism       Severe obesity (BMI >= 40) (HCC) 02/08/2015 by Conard NovakJackson, Stephen D, MD No   Hypothyroidism 02/08/2015 by Conard NovakJackson, Stephen D, MD No   Overview Signed 11/18/2016  1:19 PM by Farrel ConnersGutierrez, Colleen, CNM    Repeat TSH and free t4 at 16-18 weeks-if TSH higher or free T4 lower, start 50 mgm Synthroid      Maternal obesity, antepartum 10/26/2014 by Kirby FunkEllestad, Sarah, MD No       Preterm labor symptoms and general obstetric precautions including but not limited to vaginal bleeding, contractions, leaking of fluid and fetal movement were reviewed in detail with the patient. Encouraged increased hydration and adequate protein intake. Notify us for worsening headache or headache unrelieved by Tylenol.  Begin twice weekly APT. Return for NST/ROB in 4 days and AFI/NST/ROB in 1  week.  Tresea MallJane Jessejames Steelman, CNM  04/12/2017 10:58 AM

## 2017-04-12 NOTE — Progress Notes (Signed)
Pt c/o severe headache Tuesday night and seeing spots. NST/AFI today.

## 2017-04-17 ENCOUNTER — Ambulatory Visit (INDEPENDENT_AMBULATORY_CARE_PROVIDER_SITE_OTHER): Payer: BLUE CROSS/BLUE SHIELD | Admitting: Maternal Newborn

## 2017-04-17 ENCOUNTER — Encounter: Payer: BLUE CROSS/BLUE SHIELD | Admitting: Maternal Newborn

## 2017-04-17 ENCOUNTER — Encounter: Payer: Self-pay | Admitting: Maternal Newborn

## 2017-04-17 ENCOUNTER — Telehealth: Payer: Self-pay

## 2017-04-17 VITALS — BP 144/86 | Wt 276.0 lb

## 2017-04-17 DIAGNOSIS — Z3A34 34 weeks gestation of pregnancy: Secondary | ICD-10-CM

## 2017-04-17 DIAGNOSIS — O10919 Unspecified pre-existing hypertension complicating pregnancy, unspecified trimester: Secondary | ICD-10-CM

## 2017-04-17 DIAGNOSIS — O0990 Supervision of high risk pregnancy, unspecified, unspecified trimester: Secondary | ICD-10-CM

## 2017-04-17 LAB — FETAL NONSTRESS TEST

## 2017-04-17 NOTE — Progress Notes (Signed)
Routine Prenatal Care Visit  Subjective  Debra Bender is a 32 y.o. G2P1001 at 685w2d being seen today for ongoing prenatal care.  She is currently monitored for the following issues for this high-risk pregnancy and has Maternal obesity, antepartum; Severe obesity (BMI >= 40) (HCC); Hypothyroidism; History of thyroid disorder; Gastroesophageal reflux disease without esophagitis; Essential hypertension; Supervision of high risk pregnancy, antepartum; Adult BMI 40.0-44.9 kg/sq m (HCC); Family history of congenital heart disease; Chronic hypertension during pregnancy, antepartum; and Proteinuria affecting pregnancy on their problem list.  ----------------------------------------------------------------------------------- Patient reports uncomfortable cramping/contractions yesterday throughout the day. She noticed them about hourly. Happening less frequently today though she is still having some discomfort. Contractions: Irregular. Vag. Bleeding: None.  Movement: Present. Denies leaking of fluid, headache, visual changes, and epigastric pain. ----------------------------------------------------------------------------------- The following portions of the patient's history were reviewed and updated as appropriate: allergies, current medications, past family history, past medical history, past social history, past surgical history and problem list. Problem list updated.   Objective  Blood pressure (!) 144/86, weight 276 lb (125.2 kg), last menstrual period 08/11/2016. Pregravid weight 240 lb (108.9 kg) Total Weight Gain 36 lb (16.3 kg) Urinalysis: Urine Protein: Negative Urine Glucose: Negative  Fetal Status: Fetal Heart Rate (bpm): 150 Fundal Height: 36 cm Movement: Present      Baseline: 155 Variability: moderate Accelerations: present Decelerations: absent Tocometry: none The patient was monitored for 20+ minutes, fetal heart rate tracing was deemed reactive, category I  tracing.  General:  Alert, oriented and cooperative. Patient is in no acute distress.  Skin: Skin is warm and dry. No rash noted.   Cardiovascular: Normal heart rate noted  Respiratory: Normal respiratory effort, no problems with respiration noted  Abdomen: Soft, gravid, appropriate for gestational age. Pain/Pressure: Present     Pelvic:  Cervical exam deferred        Extremities: Normal range of motion.  Edema: None  Mental Status: Normal mood and affect. Normal behavior. Normal judgment and thought content.     Assessment   32 y.o. G2P1001 at 8885w2d, EDD 05/27/2017 by Ultrasound presenting for routine prenatal visit.  Plan   pregnancy Problems (from 10/12/16 to present)    Problem Noted Resolved   Proteinuria affecting pregnancy 11/30/2016 by Conard NovakJackson, Stephen D, MD No   Overview Signed 11/30/2016 10:29 AM by Conard NovakJackson, Stephen D, MD    Initial urine p:c ratio = 328 24 hour urine protein = 257.   [ ]  continue to monitor      Chronic hypertension during pregnancy, antepartum 11/16/2016 by Conard NovakJackson, Stephen D, MD No   Overview Addendum 01/11/2017  1:33 PM by Vena AustriaStaebler, Andreas, MD    ASA 81 mgm daily, monthly ultrasounds for growth in third trimester, weekly antepartum testing at 32 weeks, and twice weekly testing at 34 weeks with delivery no later than 39 weeks.  [X]  baseline labs with CBC, CMP, urine protein/creatinine ratio [ ]  no BP meds unless BPs become elevated [ ]  ultrasound for growth at 28, 32, 36 weeks   Current antihypertensives:  Norvasc   Baseline and [facility] refresh links as needed)  Lab Results  Component Value Date   PLT 277 10/25/2016   CREATININE 0.68 10/25/2016   AST 15 10/25/2016   ALT 9 10/25/2016   PROTCRRATIO 0.13 02/08/2015   PROTEIN24HR 257 (H) 11/24/2016    Antenatal Testing CHTN - O10.919  Group I  BP < 140/90, no preeclampsia, AGA,  nml AFV, +/- meds  Group II BP > 140/90, on meds, no preeclampsia, AGA, nml AFV   20-28-34-38  20-24-28-32-35-38  32//2 x wk  28//BPP wkly then 32//2 x wk  40 no meds; 39 meds  PRN or 37         Family history of congenital heart disease  by Katrina Stack No   Overview Signed 11/18/2016  1:11 PM by Farrel Conners, CNM    Seen by genetic counselor. Fetal echo scheduled      Adult BMI 40.0-44.9 kg/sq m (HCC) 10/26/2016 by Farrel Conners, CNM No   Supervision of high risk pregnancy, antepartum 10/12/2016 by Tresea Mall, CNM No   Overview Addendum 04/12/2017 10:44 AM by Tresea Mall, CNM    Clinic [facility] Prenatal Labs  Dating 9wk 4d ultrasound Blood type: B/Positive/-- (07/25 0952) B POS  Genetic Screen 1 Screen: neg      Antibody:Negative (07/25 0952)neg  Anatomic [place] Normal female Rubella: 1.50 (07/25 0952) Immune Varicella:   immune  GTT Early: 88 Third trimester: 83 RPR: Non Reactive (07/25 0952) negative  Rhogam  HBsAg: Negative (07/25 0952) non reactive  TDaP vaccine                       Flu Shot: 01/11/17 HIV:   negative  Baby Food                                GBS:   Contraception  Pap:10/12/16 NIL  CBB     CS/VBAC    Support Person Husband Terry       High risk due to [place], hx of preeclampsia, morbid obesity, family hx of congenital heart disease, borderline hypothyroidism       Severe obesity (BMI >= 40) (HCC) 02/08/2015 by Conard Novak, MD No   Hypothyroidism 02/08/2015 by Conard Novak, MD No   Overview Signed 11/18/2016  1:19 PM by Farrel Conners, CNM    Repeat TSH and free t4 at 16-18 weeks-if TSH higher or free T4 lower, start 50 mgm Synthroid      Maternal obesity, antepartum 10/26/2014 by Kirby Funk, MD No    Repeat BP 130/80. Advised patient to go to triage if she develops any new symptoms that could indicate pre-eclampsia. Next appointment on Thursday.  Preterm labor symptoms and general obstetric precautions including but not limited to vaginal bleeding, contractions, leaking of fluid and fetal  movement were reviewed in detail with the patient.  Continue twice weekly APT. Keep appointment 04/19/17 for AFI/NST/ROB.  Return in about 1 week (around 04/24/2017) for ROB with NST.  Marcelyn Bruins, CNM 04/17/2017  2:52 PM

## 2017-04-17 NOTE — Telephone Encounter (Signed)
FMLA/DISABILITY form for [employer] for [employer] filled out, signature obtained, and given to [place] for processing.

## 2017-04-17 NOTE — Progress Notes (Signed)
C/o cramps all day yesterday, some felt like ctxs, drank water all day yesterday, is a little better today - in lower back and lower abd, uncomfortable, lots of pressure. rj

## 2017-04-19 ENCOUNTER — Ambulatory Visit (INDEPENDENT_AMBULATORY_CARE_PROVIDER_SITE_OTHER): Payer: BLUE CROSS/BLUE SHIELD

## 2017-04-19 ENCOUNTER — Ambulatory Visit (INDEPENDENT_AMBULATORY_CARE_PROVIDER_SITE_OTHER): Payer: BLUE CROSS/BLUE SHIELD | Admitting: Obstetrics & Gynecology

## 2017-04-19 VITALS — BP 130/80 | Wt 276.0 lb

## 2017-04-19 DIAGNOSIS — O99210 Obesity complicating pregnancy, unspecified trimester: Secondary | ICD-10-CM | POA: Diagnosis not present

## 2017-04-19 DIAGNOSIS — O0990 Supervision of high risk pregnancy, unspecified, unspecified trimester: Secondary | ICD-10-CM | POA: Diagnosis not present

## 2017-04-19 DIAGNOSIS — Z3A34 34 weeks gestation of pregnancy: Secondary | ICD-10-CM

## 2017-04-19 DIAGNOSIS — Z362 Encounter for other antenatal screening follow-up: Secondary | ICD-10-CM | POA: Diagnosis not present

## 2017-04-19 DIAGNOSIS — O10919 Unspecified pre-existing hypertension complicating pregnancy, unspecified trimester: Secondary | ICD-10-CM

## 2017-04-19 NOTE — Patient Instructions (Signed)
Third Trimester of Pregnancy The third trimester is from week 28 through week 40 (months 7 through 9). The third trimester is a time when the unborn baby (fetus) is growing rapidly. At the end of the ninth month, the fetus is about 20 inches in length and weighs 6-10 pounds. Body changes during your third trimester Your body will continue to go through many changes during pregnancy. The changes vary from woman to woman. During the third trimester:  Your weight will continue to increase. You can expect to gain 25-35 pounds (11-16 kg) by the end of the pregnancy.  You may begin to get stretch marks on your hips, abdomen, and breasts.  You may urinate more often because the fetus is moving lower into your pelvis and pressing on your bladder.  You may develop or continue to have heartburn. This is caused by increased hormones that slow down muscles in the digestive tract.  You may develop or continue to have constipation because increased hormones slow digestion and cause the muscles that push waste through your intestines to relax.  You may develop hemorrhoids. These are swollen veins (varicose veins) in the rectum that can itch or be painful.  You may develop swollen, bulging veins (varicose veins) in your legs.  You may have increased body aches in the pelvis, back, or thighs. This is due to weight gain and increased hormones that are relaxing your joints.  You may have changes in your hair. These can include thickening of your hair, rapid growth, and changes in texture. Some women also have hair loss during or after pregnancy, or hair that feels dry or thin. Your hair will most likely return to normal after your baby is born.  Your breasts will continue to grow and they will continue to become tender. A yellow fluid (colostrum) may leak from your breasts. This is the first milk you are producing for your baby.  Your belly button may stick out.  You may notice more swelling in your hands,  face, or ankles.  You may have increased tingling or numbness in your hands, arms, and legs. The skin on your belly may also feel numb.  You may feel short of breath because of your expanding uterus.  You may have more problems sleeping. This can be caused by the size of your belly, increased need to urinate, and an increase in your body's metabolism.  You may notice the fetus "dropping," or moving lower in your abdomen (lightening).  You may have increased vaginal discharge.  You may notice your joints feel loose and you may have pain around your pelvic bone.  What to expect at prenatal visits You will have prenatal exams every 2 weeks until week 36. Then you will have weekly prenatal exams. During a routine prenatal visit:  You will be weighed to make sure you and the baby are growing normally.  Your blood pressure will be taken.  Your abdomen will be measured to track your baby's growth.  The fetal heartbeat will be listened to.  Any test results from the previous visit will be discussed.  You may have a cervical check near your due date to see if your cervix has softened or thinned (effaced).  You will be tested for Group B streptococcus. This happens between 35 and 37 weeks.  Your health care provider may ask you:  What your birth plan is.  How you are feeling.  If you are feeling the baby move.  If you have had   any abnormal symptoms, such as leaking fluid, bleeding, severe headaches, or abdominal cramping.  If you are using any tobacco products, including cigarettes, chewing tobacco, and electronic cigarettes.  If you have any questions.  Other tests or screenings that may be performed during your third trimester include:  Blood tests that check for low iron levels (anemia).  Fetal testing to check the health, activity level, and growth of the fetus. Testing is done if you have certain medical conditions or if there are problems during the  pregnancy.  Nonstress test (NST). This test checks the health of your baby to make sure there are no signs of problems, such as the baby not getting enough oxygen. During this test, a belt is placed around your belly. The baby is made to move, and its heart rate is monitored during movement.  What is false labor? False labor is a condition in which you feel small, irregular tightenings of the muscles in the womb (contractions) that usually go away with rest, changing position, or drinking water. These are called Braxton Hicks contractions. Contractions may last for hours, days, or even weeks before true labor sets in. If contractions come at regular intervals, become more frequent, increase in intensity, or become painful, you should see your health care provider. What are the signs of labor?  Abdominal cramps.  Regular contractions that start at 10 minutes apart and become stronger and more frequent with time.  Contractions that start on the top of the uterus and spread down to the lower abdomen and back.  Increased pelvic pressure and dull back pain.  A watery or bloody mucus discharge that comes from the vagina.  Leaking of amniotic fluid. This is also known as your "water breaking." It could be a slow trickle or a gush. Let your health care provider know if it has a color or strange odor. If you have any of these signs, call your health care provider right away, even if it is before your due date. Follow these instructions at home: Medicines  Follow your health care provider's instructions regarding medicine use. Specific medicines may be either safe or unsafe to take during pregnancy.  Take a prenatal vitamin that contains at least 600 micrograms (mcg) of folic acid.  If you develop constipation, try taking a stool softener if your health care provider approves. Eating and drinking  Eat a balanced diet that includes fresh fruits and vegetables, whole grains, good sources of protein  such as meat, eggs, or tofu, and low-fat dairy. Your health care provider will help you determine the amount of weight gain that is right for you.  Avoid raw meat and uncooked cheese. These carry germs that can cause birth defects in the baby.  If you have low calcium intake from food, talk to your health care provider about whether you should take a daily calcium supplement.  Eat four or five small meals rather than three large meals a day.  Limit foods that are high in fat and processed sugars, such as fried and sweet foods.  To prevent constipation: ? Drink enough fluid to keep your urine clear or pale yellow. ? Eat foods that are high in fiber, such as fresh fruits and vegetables, whole grains, and beans. Activity  Exercise only as directed by your health care provider. Most women can continue their usual exercise routine during pregnancy. Try to exercise for 30 minutes at least 5 days a week. Stop exercising if you experience uterine contractions.  Avoid heavy   lifting.  Do not exercise in extreme heat or humidity, or at high altitudes.  Wear low-heel, comfortable shoes.  Practice good posture.  You may continue to have sex unless your health care provider tells you otherwise. Relieving pain and discomfort  Take frequent breaks and rest with your legs elevated if you have leg cramps or low back pain.  Take warm sitz baths to soothe any pain or discomfort caused by hemorrhoids. Use hemorrhoid cream if your health care provider approves.  Wear a good support bra to prevent discomfort from breast tenderness.  If you develop varicose veins: ? Wear support pantyhose or compression stockings as told by your healthcare provider. ? Elevate your feet for 15 minutes, 3-4 times a day. Prenatal care  Write down your questions. Take them to your prenatal visits.  Keep all your prenatal visits as told by your health care provider. This is important. Safety  Wear your seat belt at  all times when driving.  Make a list of emergency phone numbers, including numbers for family, friends, the hospital, and police and fire departments. General instructions  Avoid cat litter boxes and soil used by cats. These carry germs that can cause birth defects in the baby. If you have a cat, ask someone to clean the litter box for you.  Do not travel far distances unless it is absolutely necessary and only with the approval of your health care provider.  Do not use hot tubs, steam rooms, or saunas.  Do not drink alcohol.  Do not use any products that contain nicotine or tobacco, such as cigarettes and e-cigarettes. If you need help quitting, ask your health care provider.  Do not use any medicinal herbs or unprescribed drugs. These chemicals affect the formation and growth of the baby.  Do not douche or use tampons or scented sanitary pads.  Do not cross your legs for long periods of time.  To prepare for the arrival of your baby: ? Take prenatal classes to understand, practice, and ask questions about labor and delivery. ? Make a trial run to the hospital. ? Visit the hospital and tour the maternity area. ? Arrange for maternity or paternity leave through employers. ? Arrange for family and friends to take care of pets while you are in the hospital. ? Purchase a rear-facing car seat and make sure you know how to install it in your car. ? Pack your hospital bag. ? Prepare the baby's nursery. Make sure to remove all pillows and stuffed animals from the baby's crib to prevent suffocation.  Visit your dentist if you have not gone during your pregnancy. Use a soft toothbrush to brush your teeth and be gentle when you floss. Contact a health care provider if:  You are unsure if you are in labor or if your water has broken.  You become dizzy.  You have mild pelvic cramps, pelvic pressure, or nagging pain in your abdominal area.  You have lower back pain.  You have persistent  nausea, vomiting, or diarrhea.  You have an unusual or bad smelling vaginal discharge.  You have pain when you urinate. Get help right away if:  Your water breaks before 37 weeks.  You have regular contractions less than 5 minutes apart before 37 weeks.  You have a fever.  You are leaking fluid from your vagina.  You have spotting or bleeding from your vagina.  You have severe abdominal pain or cramping.  You have rapid weight loss or weight gain.    You have shortness of breath with chest pain.  You notice sudden or extreme swelling of your face, hands, ankles, feet, or legs.  Your baby makes fewer than 10 movements in 2 hours.  You have severe headaches that do not go away when you take medicine.  You have vision changes. Summary  The third trimester is from week 28 through week 40, months 7 through 9. The third trimester is a time when the unborn baby (fetus) is growing rapidly.  During the third trimester, your discomfort may increase as you and your baby continue to gain weight. You may have abdominal, leg, and back pain, sleeping problems, and an increased need to urinate.  During the third trimester your breasts will keep growing and they will continue to become tender. A yellow fluid (colostrum) may leak from your breasts. This is the first milk you are producing for your baby.  False labor is a condition in which you feel small, irregular tightenings of the muscles in the womb (contractions) that eventually go away. These are called Braxton Hicks contractions. Contractions may last for hours, days, or even weeks before true labor sets in.  Signs of labor can include: abdominal cramps; regular contractions that start at 10 minutes apart and become stronger and more frequent with time; watery or bloody mucus discharge that comes from the vagina; increased pelvic pressure and dull back pain; and leaking of amniotic fluid. This information is not intended to replace advice  given to you by your health care provider. Make sure you discuss any questions you have with your health care provider. Document Released: 03/14/2001 Document Revised: 08/26/2015 Document Reviewed: 05/21/2012 [employer] Interactive Patient Education  2017 [employer]  

## 2017-04-19 NOTE — Progress Notes (Signed)
  Subjective  Fetal Movement? yes Contractions? yes Leaking Fluid? no Vaginal Bleeding? No No ha, blurry vision, CP, SOB, edema. Tol meds well for BP (Norvasc)  Current Outpatient Medications:  .  Acetaminophen (MAPAP) 500 MG coapsule, Take by mouth., Disp: , Rfl:  .  amLODipine (NORVASC) 5 MG tablet, Take by mouth., Disp: , Rfl:  .  Butalbital-APAP-Caffeine 50-325-40 MG capsule, Take 1-2 capsules by mouth every 6 (six) hours as needed for headache., Disp: 30 capsule, Rfl: 3 .  folic acid (FOLVITE) 1 MG tablet, TAKE 1 TABLET BY MOUTH EVERY DAY, Disp: 30 tablet, Rfl: 3 .  Prenatal Vit-Fe Fumarate-FA (PRENATAL MULTIVITAMIN) TABS tablet, Take 1 tablet by mouth daily at 12 noon., Disp: , Rfl:  .  ranitidine (ZANTAC) 300 MG tablet, , Disp: , Rfl: 4 .  sucralfate (CARAFATE) 1 g tablet, DISSOLVE 1 TABLET IN 1 OUNCE OF WATER AND TAKE 3 TIMES A DAY BEFORE MEALS AND AT BEDTIME, Disp: , Rfl:   Objective  BP 130/80   Wt 276 lb (125.2 kg)   LMP 08/11/2016   BMI 46.64 kg/m  General: NAD Pumonary: no increased work of breathing Abdomen: gravid, non-tender Extremities: no edema Psychiatric: mood appropriate, affect full A NST procedure was performed with FHR monitoring and a normal baseline established, appropriate time of 20-40 minutes of evaluation, and accels >2 seen w 15x15 characteristics.  Results show a REACTIVE NST.  Review of ULTRASOUND.    I have personally reviewed images and report of [facility]    Plan of management to be discussed with patient. Assessment  32 y.o. G2P1001 at 7423w4d by  05/27/2017, by Ultrasound presenting for routine prenatal visit  Plan   Problem List Items Addressed This Visit      Cardiovascular and Mediastinum   Chronic hypertension during pregnancy, antepartum   Relevant Orders   US OB Follow Up     Other   Maternal obesity, antepartum    Other Visit Diagnoses    [redacted] weeks gestation of pregnancy    -  Primary    PNV, FMC, PTL  precautions, ASA Patient is counseled at length about the risks and consequences of Gestational Hypertension, especially as it pertains to the development of preclampsia.  Risks of preclampsia include maternal and fetal complications, usually necessitating active delivery planning.  Physical exam findings, fetal heart rate monitoring, and laboratory findings are utilized together to determine the presence and severity of any gestational hypertension disorder.  Further management will be based on these findings.  Continued bed rest and frequent follow-up for blood pressure checks and symptom evaluation is of utmost importance for the remainder of pregnancy.  APT twice weekly. IOL 37 weeks Cont Norvasc 5 mg  Annamarie MajorPaul Harris, MD, Merlinda FrederickFACOG [facility], [facility] 04/19/2017  1:58 PM

## 2017-04-24 ENCOUNTER — Ambulatory Visit (INDEPENDENT_AMBULATORY_CARE_PROVIDER_SITE_OTHER): Payer: BLUE CROSS/BLUE SHIELD | Admitting: Obstetrics and Gynecology

## 2017-04-24 ENCOUNTER — Encounter: Payer: Self-pay | Admitting: Obstetrics and Gynecology

## 2017-04-24 VITALS — BP 136/88 | Wt 272.0 lb

## 2017-04-24 DIAGNOSIS — O10919 Unspecified pre-existing hypertension complicating pregnancy, unspecified trimester: Secondary | ICD-10-CM | POA: Diagnosis not present

## 2017-04-24 DIAGNOSIS — O1213 Gestational proteinuria, third trimester: Secondary | ICD-10-CM

## 2017-04-24 DIAGNOSIS — Z3A35 35 weeks gestation of pregnancy: Secondary | ICD-10-CM

## 2017-04-24 DIAGNOSIS — O0990 Supervision of high risk pregnancy, unspecified, unspecified trimester: Secondary | ICD-10-CM

## 2017-04-24 DIAGNOSIS — Z6841 Body Mass Index (BMI) 40.0 and over, adult: Secondary | ICD-10-CM

## 2017-04-24 DIAGNOSIS — O99210 Obesity complicating pregnancy, unspecified trimester: Secondary | ICD-10-CM

## 2017-04-24 DIAGNOSIS — E039 Hypothyroidism, unspecified: Secondary | ICD-10-CM

## 2017-04-24 NOTE — Progress Notes (Signed)
Routine Prenatal Care Visit  Subjective  Debra Bender is a 32 y.o. G2P1001 at 4277w3d being seen today for ongoing prenatal care.  She is currently monitored for the following issues for this high-risk pregnancy and has Maternal obesity, antepartum; Severe obesity (BMI >= 40) (HCC); Hypothyroidism; History of thyroid disorder; Gastroesophageal reflux disease without esophagitis; Essential hypertension; Supervision of high risk pregnancy, antepartum; Adult BMI 40.0-44.9 kg/sq m (HCC); Family history of congenital heart disease; Chronic hypertension during pregnancy, antepartum; and Proteinuria affecting pregnancy on their problem list.  ----------------------------------------------------------------------------------- Patient reports intermittent headaches.  No visual changes, no RUQ pain.  She does not take her blood pressure when she has headaches. Encouraged her to get a reliable home BP machine and bring it to clinic for calibration.  Contractions: Irritability. Vag. Bleeding: None.  Movement: Present. Denies leaking of fluid.  ----------------------------------------------------------------------------------- The following portions of the patient's history were reviewed and updated as appropriate: allergies, current medications, past family history, past medical history, past social history, past surgical history and problem list. Problem list updated.  Objective  Blood pressure 136/88, weight 272 lb (123.4 kg), last menstrual period 08/11/2016, unknown if currently breastfeeding. Pregravid weight 240 lb (108.9 kg) Total Weight Gain 32 lb (14.5 kg) Urinalysis:      Fetal Status: Fetal Heart Rate (bpm): 145   Movement: Present     General:  Alert, oriented and cooperative. Patient is in no acute distress.  Skin: Skin is warm and dry. No rash noted.   Cardiovascular: Normal heart rate noted  Respiratory: Normal respiratory effort, no problems with respiration noted  Abdomen: Soft,  gravid, appropriate for gestational age. Pain/Pressure: Present     Pelvic:  Cervical exam deferred        Extremities: Normal range of motion.     Mental Status: Normal mood and affect. Normal behavior. Normal judgment and thought content.   NST Baseline FHR: 145 beats/min Variability: moderate Accelerations: present Decelerations: absent Tocometry: not done  Interpretation:  INDICATIONS: chronic hypertension RESULTS:  A NST procedure was performed with FHR monitoring and a normal baseline established, appropriate time of 20-40 minutes of evaluation, and accels >2 seen w 15x15 characteristics.  Results show a REACTIVE NST.    Assessment   32 y.o. G2P1001 at 6077w3d by  05/27/2017, by Ultrasound presenting for routine prenatal visit  Plan   pregnancy Problems (from 10/12/16 to present)    Problem Noted Resolved   Proteinuria affecting pregnancy 11/30/2016 by Conard NovakJackson, Stephen D, MD No   Overview Signed 11/30/2016 10:29 AM by Conard NovakJackson, Stephen D, MD    Initial urine p:c ratio = 328 24 hour urine protein = 257.   [ ]  continue to monitor      Chronic hypertension during pregnancy, antepartum 11/16/2016 by Conard NovakJackson, Stephen D, MD No   Overview Addendum 01/11/2017  1:33 PM by Vena AustriaStaebler, Andreas, MD    ASA 81 mgm daily, monthly ultrasounds for growth in third trimester, weekly antepartum testing at 32 weeks, and twice weekly testing at 34 weeks with delivery no later than 39 weeks.  [X]  baseline labs with CBC, CMP, urine protein/creatinine ratio [ ]  no BP meds unless BPs become elevated [ ]  ultrasound for growth at 28, 32, 36 weeks   Current antihypertensives:  Norvasc   Baseline and surveillance labs ([facility], refresh links as needed)  Lab Results  Component Value Date   PLT 277 10/25/2016   CREATININE 0.68 10/25/2016   AST 15 10/25/2016   ALT 9  10/25/2016   PROTCRRATIO 0.13 02/08/2015   PROTEIN24HR 257 (H) 11/24/2016    Antenatal Testing CHTN - O10.919  Group I   BP < 140/90, no preeclampsia, AGA,  nml AFV, +/- meds    Group II BP > 140/90, on meds, no preeclampsia, AGA, nml AFV  20-28-34-38  20-24-28-32-35-38  32//2 x wk  28//BPP wkly then 32//2 x wk  40 no meds; 39 meds  PRN or 37         Family history of congenital heart disease  by Katrina Stack No   Overview Signed 11/18/2016  1:11 PM by Farrel Conners, CNM    Seen by genetic counselor. Fetal echo scheduled      Adult BMI 40.0-44.9 kg/sq m (HCC) 10/26/2016 by Farrel Conners, CNM No   Supervision of high risk pregnancy, antepartum 10/12/2016 by Tresea Mall, CNM No   Overview Addendum 04/12/2017 10:44 AM by Tresea Mall, CNM    [facility]inic [facility] Prenatal Labs  Dating 9wk 4d ultrasound Blood type: B/Positive/-- (07/25 0952) B POS  Genetic Screen 1 Screen: neg      Antibody:Negative (07/25 0952)neg  Anatomic [place] Normal female Rubella: 1.50 (07/25 0952) Immune Varicella:   immune  GTT Early: 88 Third trimester: 83 RPR: Non Reactive (07/25 0952) negative  Rhogam  HBsAg: Negative (07/25 0952) non reactive  TDaP vaccine                       Flu Shot: 01/11/17 HIV:   negative  Baby Food                                GBS:   Contraception  Pap:10/12/16 NIL  CBB     CS/VBAC    Support Person Husband Terry       High risk due to [place], hx of preeclampsia, morbid obesity, family hx of congenital heart disease, borderline hypothyroidism       Severe obesity (BMI >= 40) (HCC) 02/08/2015 by Conard Novak, MD No   Hypothyroidism 02/08/2015 by Conard Novak, MD No   Overview Signed 11/18/2016  1:19 PM by Farrel Conners, CNM    Repeat TSH and free t4 at 16-18 weeks-if TSH higher or free T4 lower, start 50 mgm Synthroid      Maternal obesity, antepartum 10/26/2014 by Kirby Funk, MD No     Preterm labor symptoms and general obstetric precautions including but not limited to vaginal bleeding, contractions, leaking of fluid and fetal movement were reviewed in detail with  the patient. Please refer to After Visit Summary for other counseling recommendations.   Return in about 1 week (around 05/01/2017) for schedule rob/nst 1 week, then growth u/s with ROB/NST 2 days after that appt.  Thomasene Mohair, MD  04/25/2017 12:47 PM

## 2017-04-25 ENCOUNTER — Encounter: Payer: Self-pay | Admitting: Obstetrics and Gynecology

## 2017-04-26 ENCOUNTER — Ambulatory Visit (INDEPENDENT_AMBULATORY_CARE_PROVIDER_SITE_OTHER): Payer: BLUE CROSS/BLUE SHIELD

## 2017-04-26 ENCOUNTER — Ambulatory Visit (INDEPENDENT_AMBULATORY_CARE_PROVIDER_SITE_OTHER): Payer: BLUE CROSS/BLUE SHIELD | Admitting: Advanced Practice Midwife

## 2017-04-26 ENCOUNTER — Encounter: Payer: Self-pay | Admitting: Advanced Practice Midwife

## 2017-04-26 VITALS — BP 134/82 | Wt 276.0 lb

## 2017-04-26 DIAGNOSIS — O0990 Supervision of high risk pregnancy, unspecified, unspecified trimester: Secondary | ICD-10-CM

## 2017-04-26 DIAGNOSIS — Z3A35 35 weeks gestation of pregnancy: Secondary | ICD-10-CM

## 2017-04-26 DIAGNOSIS — O10919 Unspecified pre-existing hypertension complicating pregnancy, unspecified trimester: Secondary | ICD-10-CM | POA: Diagnosis not present

## 2017-04-26 DIAGNOSIS — O99210 Obesity complicating pregnancy, unspecified trimester: Secondary | ICD-10-CM | POA: Diagnosis not present

## 2017-04-26 LAB — FETAL NONSTRESS TEST

## 2017-04-26 NOTE — Progress Notes (Signed)
NST/AFI today 

## 2017-04-26 NOTE — Progress Notes (Signed)
Routine Prenatal Care Visit  Subjective  Debra Bender is a 32 y.o. G2P1001 at 2538w4d being seen today for ongoing prenatal care.  She is currently monitored for the following issues for this high-risk pregnancy and has Maternal obesity, antepartum; Severe obesity (BMI >= 40) (HCC); Hypothyroidism; History of thyroid disorder; Gastroesophageal reflux disease without esophagitis; Essential hypertension; Supervision of high risk pregnancy, antepartum; Adult BMI 40.0-44.9 kg/sq m (HCC); Family history of congenital heart disease; Chronic hypertension during pregnancy, antepartum; and Proteinuria affecting pregnancy on their problem list.  ----------------------------------------------------------------------------------- Patient reports headache. She has been having some headaches especially at night which are relieved by Tylenol PM. She denies visual changes or epigastric pain. She has not yet gotten a BP cuff for home but is still going to get one. She also skipped her caffeine recently and says her headache could have been due to caffeine withdrawal.  Contractions: Irritability. Vag. Bleeding: None.  Movement: Present. Denies leaking of fluid.  ----------------------------------------------------------------------------------- The following portions of the patient's history were reviewed and updated as appropriate: allergies, current medications, past family history, past medical history, past social history, past surgical history and problem list. Problem list updated.   Objective  Blood pressure 134/82, weight 276 lb (125.2 kg), last menstrual period 08/11/2016 Pregravid weight 240 lb (108.9 kg) Total Weight Gain 36 lb (16.3 kg) Urinalysis: Urine Protein: Negative Urine Glucose: Negative  Fetal Status: Fetal Heart Rate (bpm): 140   Movement: Present  Presentation: Vertex  She was scheduled for AFI today but ended up having growth scan and AFI.  Growth is 7 pounds 1 ounce, 79%, AC > 97.7%,  AFI 13.42 NST reactive today, 20 minute duration. 140 bpm baseline, moderate variability, +accelerations 15x15 >2, -decelerations  General:  Alert, oriented and cooperative. Patient is in no acute distress.  Skin: Skin is warm and dry. No rash noted.   Cardiovascular: Normal heart rate noted  Respiratory: Normal respiratory effort, no problems with respiration noted  Abdomen: Soft, gravid, appropriate for gestational age. Pain/Pressure: Present     Pelvic:  Cervical exam deferred        Extremities: Normal range of motion.     Mental Status: Normal mood and affect. Normal behavior. Normal judgment and thought content.   Assessment   32 y.o. G2P1001 at 538w4d by  05/27/2017, by Ultrasound presenting for routine prenatal visit  Plan   pregnancy Problems (from 10/12/16 to present)    Problem Noted Resolved   Proteinuria affecting pregnancy 11/30/2016 by Conard NovakJackson, Stephen D, MD No   Overview Signed 11/30/2016 10:29 AM by Conard NovakJackson, Stephen D, MD    Initial urine p:c ratio = 328 24 hour urine protein = 257.   [ ]  continue to monitor      Chronic hypertension during pregnancy, antepartum 11/16/2016 by Conard NovakJackson, Stephen D, MD No   Overview Addendum 01/11/2017  1:33 PM by Vena AustriaStaebler, Andreas, MD    ASA 81 mgm daily, monthly ultrasounds for growth in third trimester, weekly antepartum testing at 32 weeks, and twice weekly testing at 34 weeks with delivery no later than 39 weeks.  [X]  baseline labs with CBC, CMP, urine protein/creatinine ratio [ ]  no BP meds unless BPs become elevated [ ]  ultrasound for growth at 28, 32, 36 weeks   Current antihypertensives:  Norvasc   Baseline and surveillance [facility], refresh links as needed)  Lab Results  Component Value Date   PLT 277 10/25/2016   CREATININE 0.68 10/25/2016   AST 15 10/25/2016  ALT 9 10/25/2016   PROTCRRATIO 0.13 02/08/2015   PROTEIN24HR 257 (H) 11/24/2016    Antenatal Testing CHTN - O10.919  Group I  BP < 140/90,  no preeclampsia, AGA,  nml AFV, +/- meds    Group II BP > 140/90, on meds, no preeclampsia, AGA, nml AFV  20-28-34-38  20-24-28-32-35-38  32//2 x wk  28//BPP wkly then 32//2 x wk  40 no meds; 39 meds  PRN or 37         Family history of congenital heart disease  by Katrina Stack No   Overview Signed 11/18/2016  1:11 PM by Farrel Conners, CNM    Seen by genetic counselor. Fetal echo scheduled      Adult BMI 40.0-44.9 kg/sq m (HCC) 10/26/2016 by Farrel Conners, CNM No   Supervision of high risk pregnancy, antepartum 10/12/2016 by Tresea Mall, CNM No   Overview Addendum 04/12/2017 10:44 AM by Tresea Mall, CNM    [facility]inic [facility] Prenatal Labs  Dating 9wk 4d ultrasound Blood type: B/Positive/-- (07/25 0952) B POS  Genetic Screen 1 Screen: neg      Antibody:Negative (07/25 0952)neg  Anatomic [place] Normal female Rubella: 1.50 (07/25 0952) Immune Varicella:   immune  GTT Early: 88 Third trimester: 83 RPR: Non Reactive (07/25 0952) negative  Rhogam  HBsAg: Negative (07/25 0952) non reactive  TDaP vaccine                       Flu Shot: 01/11/17 HIV:   negative  Baby Food                                GBS:   Contraception  Pap:10/12/16 NIL  CBB     CS/VBAC    Support Person Husband Terry       High risk due to [place], hx of preeclampsia, morbid obesity, family hx of congenital heart disease, borderline hypothyroidism       Severe obesity (BMI >= 40) (HCC) 02/08/2015 by Conard Novak, MD No   Hypothyroidism 02/08/2015 by Conard Novak, MD No   Overview Signed 11/18/2016  1:19 PM by Farrel Conners, CNM    Repeat TSH and free t4 at 16-18 weeks-if TSH higher or free T4 lower, start 50 mgm Synthroid      Maternal obesity, antepartum 10/26/2014 by Kirby Funk, MD No       Preterm labor symptoms and general obstetric precautions including but not limited to vaginal bleeding, contractions, leaking of fluid and fetal movement were reviewed in detail with the  patient.    Return for has follow up already scheduled.  Tresea Mall, CNM  04/26/2017 2:50 PM

## 2017-05-01 ENCOUNTER — Ambulatory Visit (INDEPENDENT_AMBULATORY_CARE_PROVIDER_SITE_OTHER): Payer: BLUE CROSS/BLUE SHIELD | Admitting: Obstetrics & Gynecology

## 2017-05-01 ENCOUNTER — Ambulatory Visit (INDEPENDENT_AMBULATORY_CARE_PROVIDER_SITE_OTHER): Payer: BLUE CROSS/BLUE SHIELD

## 2017-05-01 ENCOUNTER — Other Ambulatory Visit: Payer: Self-pay | Admitting: Obstetrics and Gynecology

## 2017-05-01 VITALS — BP 130/80 | Wt 280.0 lb

## 2017-05-01 DIAGNOSIS — O10919 Unspecified pre-existing hypertension complicating pregnancy, unspecified trimester: Secondary | ICD-10-CM | POA: Diagnosis not present

## 2017-05-01 DIAGNOSIS — O0990 Supervision of high risk pregnancy, unspecified, unspecified trimester: Secondary | ICD-10-CM

## 2017-05-01 DIAGNOSIS — Z3A36 36 weeks gestation of pregnancy: Secondary | ICD-10-CM

## 2017-05-01 NOTE — Progress Notes (Signed)
Review of ULTRASOUND.    I have personally reviewed images and report of [facility].    Plan of management to be discussed with patient.  A NST procedure was performed with FHR monitoring and a normal baseline established, appropriate time of 20-40 minutes of evaluation, and accels >2 seen w 15x15 characteristics.  Results show a REACTIVE NST.   GBS today. Cont Norvasc for cHTN; denies sx's of concern  Annamarie MajorPaul Cailan General, MD, Merlinda FrederickFACOG [facility], [facility] 05/01/2017  2:50 PM

## 2017-05-03 ENCOUNTER — Encounter: Payer: Self-pay | Admitting: Obstetrics and Gynecology

## 2017-05-03 ENCOUNTER — Ambulatory Visit (INDEPENDENT_AMBULATORY_CARE_PROVIDER_SITE_OTHER): Payer: BLUE CROSS/BLUE SHIELD | Admitting: Obstetrics and Gynecology

## 2017-05-03 VITALS — BP 120/80 | Wt 282.0 lb

## 2017-05-03 DIAGNOSIS — O0990 Supervision of high risk pregnancy, unspecified, unspecified trimester: Secondary | ICD-10-CM

## 2017-05-03 DIAGNOSIS — Z6841 Body Mass Index (BMI) 40.0 and over, adult: Secondary | ICD-10-CM

## 2017-05-03 DIAGNOSIS — O99210 Obesity complicating pregnancy, unspecified trimester: Secondary | ICD-10-CM

## 2017-05-03 DIAGNOSIS — I10 Essential (primary) hypertension: Secondary | ICD-10-CM

## 2017-05-03 DIAGNOSIS — K219 Gastro-esophageal reflux disease without esophagitis: Secondary | ICD-10-CM

## 2017-05-03 DIAGNOSIS — Z3A36 36 weeks gestation of pregnancy: Secondary | ICD-10-CM

## 2017-05-03 LAB — FETAL NONSTRESS TEST

## 2017-05-03 NOTE — Progress Notes (Signed)
Routine Prenatal Care Visit  Subjective  Debra Bender is a 32 y.o. G2P1001 at 2842w4d being seen today for ongoing prenatal care.  She is currently monitored for the following issues for this high-risk pregnancy and has Maternal obesity, antepartum; Severe obesity (BMI >= 40) (HCC); Hypothyroidism; History of thyroid disorder; Gastroesophageal reflux disease without esophagitis; Essential hypertension; Supervision of high risk pregnancy, antepartum; Adult BMI 40.0-44.9 kg/sq m (HCC); Family history of congenital heart disease; Chronic hypertension during pregnancy, antepartum; and Proteinuria affecting pregnancy on their problem list.  ----------------------------------------------------------------------------------- Patient reports no complaints.   Contractions: Regular. Vag. Bleeding: None.  Movement: Present. Denies leaking of fluid.   NST reactive, Baseline 150, moderate variability, no decelerations, 2 15x15 accelerations present ----------------------------------------------------------------------------------- The following portions of the patient's history were reviewed and updated as appropriate: allergies, current medications, past family history, past medical history, past social history, past surgical history and problem list. Problem list updated.   Objective  Blood pressure 120/80, weight 282 lb (127.9 kg), last menstrual period 08/11/2016, unknown if currently breastfeeding. Pregravid weight 240 lb (108.9 kg) Total Weight Gain 42 lb (19.1 kg) Urinalysis:      Fetal Status:     Movement: Present     General:  Alert, oriented and cooperative. Patient is in no acute distress.  Skin: Skin is warm and dry. No rash noted.   Cardiovascular: Normal heart rate noted  Respiratory: Normal respiratory effort, no problems with respiration noted  Abdomen: Soft, gravid, appropriate for gestational age. Pain/Pressure: Present     Pelvic:  Cervical exam deferred        Extremities:  Normal range of motion.     ental Status: Normal mood and affect. Normal behavior. Normal judgment and thought content.     Assessment   32 y.o. G2P1001 at 2342w4d by  05/27/2017, by Ultrasound presenting for routine prenatal visit  Plan   pregnancy Problems (from 10/12/16 to present)    Problem Noted Resolved   Proteinuria affecting pregnancy 11/30/2016 by Conard NovakJackson, Stephen D, MD No   Overview Signed 11/30/2016 10:29 AM by Conard NovakJackson, Stephen D, MD    Initial urine p:c ratio = 328 24 hour urine protein = 257.   [ ]  continue to monitor      Chronic hypertension during pregnancy, antepartum 11/16/2016 by Conard NovakJackson, Stephen D, MD No   Overview Addendum 01/11/2017  1:33 PM by Vena AustriaStaebler, Andreas, MD    ASA 81 mgm daily, monthly ultrasounds for growth in third trimester, weekly antepartum testing at 32 weeks, and twice weekly testing at 34 weeks with delivery no later than 39 weeks.  [X]  baseline labs with CBC, CMP, urine protein/creatinine ratio [ ]  no BP meds unless BPs become elevated [ ]  ultrasound for growth at 28, 32, 36 weeks   Current antihypertensives:  Norvasc   [facility] refresh [facility] as needed)  Lab Results  Component Value Date   PLT 277 10/25/2016   CREATININE 0.68 10/25/2016   AST 15 10/25/2016   ALT 9 10/25/2016   PROTCRRATIO 0.13 02/08/2015   PROTEIN24HR 257 (H) 11/24/2016    Antenatal Testing CHTN - O10.919  Group I  BP < 140/90, no preeclampsia, AGA,  nml AFV, +/- meds    Group II BP > 140/90, on meds, no preeclampsia, AGA, nml AFV  20-28-34-38  20-24-28-32-35-38  32//2 x wk  28//BPP wkly then 32//2 x wk  40 no meds; 39 meds  PRN or 37  Family history of congenital heart disease  by Katrina Stack No   Overview Signed 11/18/2016  1:11 PM by Farrel Conners, CNM    Seen by genetic counselor. Fetal echo scheduled      Adult BMI 40.0-44.9 kg/sq m (HCC) 10/26/2016 by Farrel Conners, CNM No    Supervision of high risk pregnancy, antepartum 10/12/2016 by Tresea Mall, CNM No   Overview Addendum 04/12/2017 10:44 AM by Tresea Mall, CNM    Clinic [facility] Prenatal Labs  Dating 9wk 4d ultrasound Blood type: B/Positive/-- (07/25 0952) B POS  Genetic Screen 1 Screen: neg      Antibody:Negative (07/25 0952)neg  Anatomic [place] Normal female Rubella: 1.50 (07/25 0952) Immune Varicella:   immune  GTT Early: 88 Third trimester: 83 RPR: Non Reactive (07/25 0952) negative  Rhogam Not needed HBsAg: Negative (07/25 0952) non reactive  TDaP vaccine   03/23/17                    Flu Shot: 01/11/17 HIV:   negative  Baby Food     Breast                           GBS:   Contraception  undecided, considering vasectomy Pap:10/12/16 NIL  CBB  Given information   CS/VBAC    Support Person Husband Aurther Loft       High risk due to [place], hx of preeclampsia, morbid obesity, family hx of congenital heart disease, borderline hypothyroidism       Severe obesity (BMI >= 40) (HCC) 02/08/2015 by Conard Novak, MD No   Hypothyroidism 02/08/2015 by Conard Novak, MD No   Overview Signed 11/18/2016  1:19 PM by Farrel Conners, CNM    Repeat TSH and free t4 at 16-18 weeks-if TSH higher or free T4 lower, start 50 mgm Synthroid      Maternal obesity, antepartum 10/26/2014 by Kirby Funk, MD No       Preterm labor symptoms and general obstetric precautions including but not limited to vaginal bleeding, contractions, leaking of fluid and fetal movement were reviewed in detail with the patient. Please refer to After Visit Summary for other counseling recommendations.   Given information on Cord blood banking. Given information on birth control options, considering vasectomy. Has tried depo in past and does not want again. Fetal growth: 7lb on last [place], Abdominal circumference >97% Chronic HTN, BP controlled on Norvasc.  Follow up Monday for NST/AFI  Adelene Idler MD [facility]  OB/GYN 05/03/17 11:19 AM

## 2017-05-05 LAB — CULTURE, BETA STREP (GROUP B ONLY): Strep Gp B Culture: NEGATIVE

## 2017-05-07 ENCOUNTER — Other Ambulatory Visit: Payer: Self-pay | Admitting: Obstetrics and Gynecology

## 2017-05-07 ENCOUNTER — Ambulatory Visit (INDEPENDENT_AMBULATORY_CARE_PROVIDER_SITE_OTHER): Payer: BLUE CROSS/BLUE SHIELD

## 2017-05-07 ENCOUNTER — Ambulatory Visit (INDEPENDENT_AMBULATORY_CARE_PROVIDER_SITE_OTHER): Payer: BLUE CROSS/BLUE SHIELD | Admitting: Certified Nurse Midwife

## 2017-05-07 VITALS — BP 118/78 | Wt 277.0 lb

## 2017-05-07 DIAGNOSIS — O99210 Obesity complicating pregnancy, unspecified trimester: Secondary | ICD-10-CM

## 2017-05-07 DIAGNOSIS — Z3A37 37 weeks gestation of pregnancy: Secondary | ICD-10-CM

## 2017-05-07 DIAGNOSIS — O0990 Supervision of high risk pregnancy, unspecified, unspecified trimester: Secondary | ICD-10-CM | POA: Diagnosis not present

## 2017-05-07 DIAGNOSIS — O10919 Unspecified pre-existing hypertension complicating pregnancy, unspecified trimester: Secondary | ICD-10-CM

## 2017-05-07 DIAGNOSIS — O99213 Obesity complicating pregnancy, third trimester: Secondary | ICD-10-CM

## 2017-05-07 NOTE — Progress Notes (Signed)
HROB at 37wk1d: CHTN with blood pressures controlled on Norvasc 5 mg/ Obesity with BMI>40 (current BMI=46.81) Having frequent contractions-irregular and inconsistent strength Baby very active after drinking a COKE before visit. NSTreactive, but frequent accelerations to 180s to 190s, and baseline remained >160, for about 50 minutes before baseline decreased to 155-160 AFI15.28 cm. Last EFW 7#1oz on 04/26/17.  Cervix: 1-2/50%/-1/posterior/soft BP 118/78 Scheduled IOL at 39 weeks (27 May 798) Breast feeding Vasectomy planned/ ?Depo Continue twice weekly NST and weekly AFI until delivery Labor precautions Farrel Connersolleen Jayley Hustead, CNM

## 2017-05-07 NOTE — Progress Notes (Signed)
Pt c/o feeling pressure and ctx every 30 mins to an hour. Also c/o headache but feels like it is from sinus pressure, pt is congested. AFI/NST today.

## 2017-05-08 ENCOUNTER — Telehealth: Payer: Self-pay

## 2017-05-08 NOTE — Telephone Encounter (Signed)
Pt's sone has flu type A.  What precautions can she take to keep from getting it as she is for induction on the 17th. [phone]  Pt has runny nose and cough which she had before son got sick.  Adv to wash hands before and after touches son, wipe down doorknobs, toilet handles, faucets, spray c lysol,   Adv for her self she can use plain sudafed, plain robitussin or musinex, push fluids, rest.

## 2017-05-10 ENCOUNTER — Other Ambulatory Visit: Payer: Self-pay | Admitting: Certified Nurse Midwife

## 2017-05-10 ENCOUNTER — Encounter: Payer: Self-pay | Admitting: Obstetrics & Gynecology

## 2017-05-10 ENCOUNTER — Ambulatory Visit (INDEPENDENT_AMBULATORY_CARE_PROVIDER_SITE_OTHER): Payer: BLUE CROSS/BLUE SHIELD | Admitting: Obstetrics & Gynecology

## 2017-05-10 VITALS — BP 110/70 | Wt 281.0 lb

## 2017-05-10 DIAGNOSIS — Z3A37 37 weeks gestation of pregnancy: Secondary | ICD-10-CM

## 2017-05-10 DIAGNOSIS — O10919 Unspecified pre-existing hypertension complicating pregnancy, unspecified trimester: Secondary | ICD-10-CM

## 2017-05-10 DIAGNOSIS — O0990 Supervision of high risk pregnancy, unspecified, unspecified trimester: Secondary | ICD-10-CM

## 2017-05-10 NOTE — Patient Instructions (Signed)
Labor Induction Labor induction is when steps are taken to cause a pregnant woman to begin the labor process. Most women go into labor on their own between 37 weeks and 42 weeks of the pregnancy. When this does not happen or when there is a medical need, methods may be used to induce labor. Labor induction causes a pregnant woman's uterus to contract. It also causes the cervix to soften (ripen), open (dilate), and thin out (efface). Usually, labor is not induced before 39 weeks of the pregnancy unless there is a problem with the baby or mother. Before inducing labor, your health care provider will consider a number of factors, including the following:  The medical condition of you and the baby.  How many weeks along you are.  The status of the baby's lung maturity.  The condition of the cervix.  The position of the baby. What are the reasons for labor induction? Labor may be induced for the following reasons:  The health of the baby or mother is at risk.  The pregnancy is overdue by 1 week or more.  The water breaks but labor does not start on its own.  The mother has a health condition or serious illness, such as high blood pressure, infection, placental abruption, or diabetes.  The amniotic fluid amounts are low around the baby.  The baby is distressed. Convenience or wanting the baby to be born on a certain date is not a reason for inducing labor. What methods are used for labor induction? Several methods of labor induction may be used, such as:  Prostaglandin medicine. This medicine causes the cervix to dilate and ripen. The medicine will also start contractions. It can be taken by mouth or by inserting a suppository into the vagina.  Inserting a thin tube (catheter) with a balloon on the end into the vagina to dilate the cervix. Once inserted, the balloon is expanded with water, which causes the cervix to open.  Stripping the membranes. Your health care provider separates  amniotic sac tissue from the cervix, causing the cervix to be stretched and causing the release of a hormone called progesterone. This may cause the uterus to contract. It is often done during an office visit. You will be sent home to wait for the contractions to begin. You will then come in for an induction.  Breaking the water. Your health care provider makes a hole in the amniotic sac using a small instrument. Once the amniotic sac breaks, contractions should begin. This may still take hours to see an effect.  Medicine to trigger or strengthen contractions. This medicine is given through an IV access tube inserted into a vein in your arm. All of the methods of induction, besides stripping the membranes, will be done in the hospital. Induction is done in the hospital so that you and the baby can be carefully monitored. How long does it take for labor to be induced? Some inductions can take up to 2-3 days. Depending on the cervix, it usually takes less time. It takes longer when you are induced early in the pregnancy or if this is your first pregnancy. If a mother is still pregnant and the induction has been going on for 2-3 days, either the mother will be sent home or a cesarean delivery will be needed. What are the risks associated with labor induction? Some of the risks of induction include:  Changes in fetal heart rate, such as too high, too low, or erratic.  Fetal distress.    Chance of infection for the mother and baby.  Increased chance of having a cesarean delivery.  Breaking off (abruption) of the placenta from the uterus (rare).  Uterine rupture (very rare). When induction is needed for medical reasons, the benefits of induction may outweigh the risks. What are some reasons for not inducing labor? Labor induction should not be done if:  It is shown that your baby does not tolerate labor.  You have had previous surgeries on your uterus, such as a myomectomy or the removal of  fibroids.  Your placenta lies very low in the uterus and blocks the opening of the cervix (placenta previa).  Your baby is not in a head-down position.  The umbilical cord drops down into the birth canal in front of the baby. This could cut off the baby's blood and oxygen supply.  You have had a previous cesarean delivery.  There are unusual circumstances, such as the baby being extremely premature. This information is not intended to replace advice given to you by your health care provider. Make sure you discuss any questions you have with your health care provider. Document Released: 08/09/2006 Document Revised: 08/26/2015 Document Reviewed: 10/17/2012 Elsevier Interactive Patient Education  2017 Elsevier Inc.  

## 2017-05-10 NOTE — Progress Notes (Signed)
Prenatal Visit Note Date: 05/10/2017 Clinic: [facility]  Subjective:  Debra Bender is a 32 y.o. G2P1001 at 4949w4d being seen today for ongoing prenatal care.  She is currently monitored for the following issues for this high-risk pregnancy and has Maternal obesity, antepartum; Severe obesity (BMI >= 40) (HCC); Hypothyroidism; History of thyroid disorder; Gastroesophageal reflux disease without esophagitis; Essential hypertension; Supervision of high risk pregnancy, antepartum; Adult BMI 40.0-44.9 kg/sq m (HCC); Family history of congenital heart disease; Chronic hypertension during pregnancy, antepartum; and Proteinuria affecting pregnancy on their problem list.  Patient reports no ctxs, VB, ROM, ha, blurry vision, epig pain, CP, or SOB.  On Norvasc.  Home BP normal..   Contractions: Irregular. Vag. Bleeding: None.  Movement: Present. Denies leaking of fluid.   The following portions of the patient's history were reviewed and updated as appropriate: allergies, current medications, past family history, past medical history, past social history, past surgical history and problem list. Problem list updated.  Objective:   Vitals:   05/10/17 0852  BP: 110/70  Weight: 281 lb (127.5 kg)    Fetal Status:     Movement: Present     General:  Alert, oriented and cooperative. Patient is in no acute distress.  Skin: Skin is warm and dry. No rash noted.   Cardiovascular: Normal heart rate noted  Respiratory: Normal respiratory effort, no problems with respiration noted  Abdomen: Soft, gravid, appropriate for gestational age. Pain/Pressure: Present     Pelvic:  Cervical exam deferred        Extremities: Normal range of motion.     Mental Status: Normal mood and affect. Normal behavior. Normal judgment and thought content.   Urinalysis: Urine Protein: Negative Urine Glucose: Negative  Assessment and Plan:  Pregnancy: G2P1001 at 4949w4d  1. [redacted] weeks gestation of pregnancy  2. Chronic hypertension  during pregnancy, antepartum Cont twice weekly APT.  IOL Feb 17 as scheduled Norvasc daily.  Monitor for s/sx preeclampsia  3. Supervision of high risk pregnancy, antepartum  4. Severe obesity (BMI >= 40) (HCC) BMI >=40 [x ] early 1h gtt -  [x ] u/s for dating [x ]  [x ] nutritional goals [x ] folic acid 1mg  [x ] bASA (>12 weeks) [x ] consider nutrition consult [x ] consider maternal EKG 1st trimester [x ] Growth u/s 28 [ ] , 32 [ ] , 36 weeks [ ]  [x ] NST/AFI weekly 36+ weeks (36[] , 37[] , 38[] , 39[] , 40[] )  Term labor symptoms and general obstetric precautions including but not limited to vaginal bleeding, contractions, leaking of fluid and fetal movement were reviewed in detail with the patient. Please refer to After Visit Summary for other counseling recommendations.  Return for as scheduled. NST and AFI 3 days  A NST procedure was performed with FHR monitoring and a normal baseline established, appropriate time of 20-40 minutes of evaluation, and accels >2 seen w 15x15 characteristics.  Results show a REACTIVE NST.   Annamarie MajorPaul Harris, MD, Merlinda FrederickFACOG [facility], [facility] 05/10/2017  9:16 AM

## 2017-05-11 NOTE — Telephone Encounter (Signed)
Take care of this please

## 2017-05-14 NOTE — Telephone Encounter (Signed)
Can u please do this.  If need sig on form then leave on desk.

## 2017-05-15 ENCOUNTER — Ambulatory Visit (INDEPENDENT_AMBULATORY_CARE_PROVIDER_SITE_OTHER): Payer: BLUE CROSS/BLUE SHIELD

## 2017-05-15 ENCOUNTER — Encounter: Payer: Self-pay | Admitting: Advanced Practice Midwife

## 2017-05-15 ENCOUNTER — Ambulatory Visit (INDEPENDENT_AMBULATORY_CARE_PROVIDER_SITE_OTHER): Payer: BLUE CROSS/BLUE SHIELD | Admitting: Advanced Practice Midwife

## 2017-05-15 VITALS — BP 122/74 | Wt 283.0 lb

## 2017-05-15 DIAGNOSIS — Z3A38 38 weeks gestation of pregnancy: Secondary | ICD-10-CM | POA: Diagnosis not present

## 2017-05-15 DIAGNOSIS — O0990 Supervision of high risk pregnancy, unspecified, unspecified trimester: Secondary | ICD-10-CM

## 2017-05-15 DIAGNOSIS — O10919 Unspecified pre-existing hypertension complicating pregnancy, unspecified trimester: Secondary | ICD-10-CM | POA: Diagnosis not present

## 2017-05-15 NOTE — Progress Notes (Signed)
Routine Prenatal Care Visit  Subjective  Debra Bender is a 32 y.o. G2P1001 at 6175w2d being seen today for ongoing prenatal care.  She is currently monitored for the following issues for this high-risk pregnancy and has Maternal obesity, antepartum; Severe obesity (BMI >= 40) (HCC); Hypothyroidism; History of thyroid disorder; Gastroesophageal reflux disease without esophagitis; Essential hypertension; Supervision of high risk pregnancy, antepartum; Adult BMI 40.0-44.9 kg/sq m (HCC); Family history of congenital heart disease; Chronic hypertension during pregnancy, antepartum; and Proteinuria affecting pregnancy on their problem list.  ----------------------------------------------------------------------------------- Patient reports contractions every 30 minutes for the past few days.   Contractions: Regular. Vag. Bleeding: None.  Movement: Present. Denies leaking of fluid.  ----------------------------------------------------------------------------------- The following portions of the patient's history were reviewed and updated as appropriate: allergies, current medications, past family history, past medical history, past social history, past surgical history and problem list. Problem list updated.   Objective  Blood pressure 122/74, weight 283 lb (128.4 kg), last menstrual period 08/11/2016, unknown if currently breastfeeding. Pregravid weight 240 lb (108.9 kg) Total Weight Gain 43 lb (19.5 kg) Urinalysis: Urine Protein: Negative Urine Glucose: Negative  Fetal Status: Fetal Heart Rate (bpm): 140   Movement: Present  Presentation: Vertex  NST today 20 minute duration, reactive with 140 bpm baseline, moderate variability, +accelerations, -decelerations AFI: 17.21  General:  Alert, oriented and cooperative. Patient is in no acute distress.  Skin: Skin is warm and dry. No rash noted.   Cardiovascular: Normal heart rate noted  Respiratory: Normal respiratory effort, no problems with  respiration noted  Abdomen: Soft, gravid, appropriate for gestational age. Pain/Pressure: Present     Pelvic:  Cervical exam deferred        Extremities: Normal range of motion.  Edema: Trace  Mental Status: Normal mood and affect. Normal behavior. Normal judgment and thought content.   Assessment   32 y.o. G2P1001 at 4675w2d by  05/27/2017, by Ultrasound presenting for routine prenatal visit  Plan   pregnancy Problems (from 10/12/16 to present)    Problem Noted Resolved   Proteinuria affecting pregnancy 11/30/2016 by Conard NovakJackson, Stephen D, MD No   Overview Signed 11/30/2016 10:29 AM by Conard NovakJackson, Stephen D, MD    Initial urine p:c ratio = 328 24 hour urine protein = 257.   [ ]  continue to monitor      Chronic hypertension during pregnancy, antepartum 11/16/2016 by Conard NovakJackson, Stephen D, MD No   Overview Addendum 01/11/2017  1:33 PM by Vena AustriaStaebler, Andreas, MD    ASA 81 mgm daily, monthly ultrasounds for growth in third trimester, weekly antepartum testing at 32 weeks, and twice weekly testing at 34 weeks with delivery no later than 39 weeks.  [X]  baseline labs with CBC, CMP, urine protein/creatinine ratio [ ]  no BP meds unless BPs become elevated [ ]  ultrasound for growth at 28, 32, 36 weeks   Current antihypertensives:  Norvasc   Baseline and surveillance labs [facility] refresh links as needed)  Lab Results  Component Value Date   PLT 277 10/25/2016   CREATININE 0.68 10/25/2016   AST 15 10/25/2016   ALT 9 10/25/2016   PROTCRRATIO 0.13 02/08/2015   PROTEIN24HR 257 (H) 11/24/2016    Antenatal Testing CHTN - O10.919  Group I  BP < 140/90, no preeclampsia, AGA,  nml AFV, +/- meds    Group II BP > 140/90, on meds, no preeclampsia, AGA, nml AFV  20-28-34-38  20-24-28-32-35-38  32//2 x wk  28//BPP wkly then 32//2 x wk  40 no meds; 39 meds  PRN or 37         Family history of congenital heart disease  by Katrina Stack No   Overview Signed 11/18/2016  1:11 PM by  Farrel Conners, CNM    Seen by genetic counselor. Fetal echo scheduled      Adult BMI 40.0-44.9 kg/sq m (HCC) 10/26/2016 by Farrel Conners, CNM No   Supervision of high risk pregnancy, antepartum 10/12/2016 by Tresea Mall, CNM No   Overview Addendum 04/12/2017 10:44 AM by Tresea Mall, CNM    Clinic [facility] Prenatal Labs  Dating 9wk 4d ultrasound Blood type: B/Positive/-- (07/25 0952) B POS  Genetic Screen 1 Screen: neg      Antibody:Negative (07/25 0952)neg  Anatomic [place] Normal female Rubella: 1.50 (07/25 0952) Immune Varicella:   immune  GTT Early: 88 Third trimester: 83 RPR: Non Reactive (07/25 0952) negative  Rhogam  HBsAg: Negative (07/25 0952) non reactive  TDaP vaccine                       Flu Shot: 01/11/17 HIV:   negative  Baby Food                                GBS:   Contraception  Pap:10/12/16 NIL  CBB     CS/VBAC    Support Person Husband Terry       High risk due to [place], hx of preeclampsia, morbid obesity, family hx of congenital heart disease, borderline hypothyroidism       Severe obesity (BMI >= 40) (HCC) 02/08/2015 by Conard Novak, MD No   Hypothyroidism 02/08/2015 by Conard Novak, MD No   Overview Signed 11/18/2016  1:19 PM by Farrel Conners, CNM    Repeat TSH and free t4 at 16-18 weeks-if TSH higher or free T4 lower, start 50 mgm Synthroid      Maternal obesity, antepartum 10/26/2014 by Kirby Funk, MD No       Term labor symptoms and general obstetric precautions including but not limited to vaginal bleeding, contractions, leaking of fluid and fetal movement were reviewed in detail with the patient. Please refer to After Visit Summary for other counseling recommendations.   Return in about 2 days (around 05/17/2017) for has f/u already scheduled for NST/rob.  Tresea Mall, CNM  05/15/2017 3:20 PM

## 2017-05-15 NOTE — Progress Notes (Signed)
No vb. No lof. AFI/NST today 

## 2017-05-17 ENCOUNTER — Encounter: Payer: Self-pay | Admitting: Advanced Practice Midwife

## 2017-05-17 ENCOUNTER — Ambulatory Visit (INDEPENDENT_AMBULATORY_CARE_PROVIDER_SITE_OTHER): Payer: BLUE CROSS/BLUE SHIELD | Admitting: Advanced Practice Midwife

## 2017-05-17 VITALS — BP 128/84

## 2017-05-17 DIAGNOSIS — Z3A38 38 weeks gestation of pregnancy: Secondary | ICD-10-CM | POA: Diagnosis not present

## 2017-05-17 NOTE — Progress Notes (Signed)
NST today. Having some contractions.

## 2017-05-17 NOTE — Progress Notes (Signed)
Routine Prenatal Care Visit  Subjective  Debra Bender is a 32 y.o. G2P1001 at [redacted]w[redacted]d being seen today for ongoing prenatal care.  She is currently monitored for the following issues for this high-risk pregnancy and has Maternal obesity, antepartum; Severe obesity (BMI >= 40) (HCC); Hypothyroidism; History of thyroid disorder; Gastroesophageal reflux disease without esophagitis; Essential hypertension; Supervision of high risk pregnancy, antepartum; Adult BMI 40.0-44.9 kg/sq m (HCC); Family history of congenital heart disease; Chronic hypertension during pregnancy, antepartum; and Proteinuria affecting pregnancy on their problem list.  ----------------------------------------------------------------------------------- Patient reports no complaints.   Contractions: Irregular. Vag. Bleeding: None.  Movement: Present. Denies leaking of fluid.  ----------------------------------------------------------------------------------- The following portions of the patient's history were reviewed and updated as appropriate: allergies, current medications, past family history, past medical history, past social history, past surgical history and problem list. Problem list updated.   Objective  Blood pressure 128/84, last menstrual period 08/11/2016 Pregravid weight 240 lb (108.9 kg) Total Weight Gain 43 lb (19.5 kg) Urinalysis:      Fetal Status: Fetal Heart Rate (bpm): 150   Movement: Present  Presentation: Vertex  NST of 20 minute duration is reactive with baseline of 150 bpm, moderate variability, + accelerations to 170 >2, -decelerations  General:  Alert, oriented and cooperative. Patient is in no acute distress.  Skin: Skin is warm and dry. No rash noted.   Cardiovascular: Normal heart rate noted  Respiratory: Normal respiratory effort, no problems with respiration noted  Abdomen: Soft, gravid, appropriate for gestational age. Pain/Pressure: Present     Pelvic:  Cervical exam performed Dilation:  2.5 Effacement (%): 50 Station: -1, cervical sweep done  Extremities: Normal range of motion.  Edema: Trace  Mental Status: Normal mood and affect. Normal behavior. Normal judgment and thought content.   Assessment   32 y.o. G2P1001 at [redacted]w[redacted]d by  05/27/2017, by Ultrasound presenting for routine prenatal visit  Plan   pregnancy Problems (from 10/12/16 to present)    Problem Noted Resolved   Proteinuria affecting pregnancy 11/30/2016 by Conard Novak, MD No   Overview Signed 11/30/2016 10:29 AM by Conard Novak, MD    Initial urine p:c ratio = 328 24 hour urine protein = 257.   [ ]  continue to monitor      Chronic hypertension during pregnancy, antepartum 11/16/2016 by Conard Novak, MD No   Overview Addendum 01/11/2017  1:33 PM by Vena Austria, MD    ASA 81 mgm daily, monthly ultrasounds for growth in third trimester, weekly antepartum testing at 32 weeks, and twice weekly testing at 34 weeks with delivery no later than 39 weeks.  [X]  baseline labs with CBC, CMP, urine protein/creatinine ratio [ ]  no BP meds unless BPs become elevated [ ]  ultrasound for growth at 28, 32, 36 weeks   Current antihypertensives:  Norvasc   Baseline and surveillance labs (pulled in from [facility], refresh links as needed)  Lab Results  Component Value Date   PLT 277 10/25/2016   CREATININE 0.68 10/25/2016   AST 15 10/25/2016   ALT 9 10/25/2016   PROTCRRATIO 0.13 02/08/2015   PROTEIN24HR 257 (H) 11/24/2016    Antenatal Testing CHTN - O10.919  Group I  BP < 140/90, no preeclampsia, AGA,  nml AFV, +/- meds    Group II BP > 140/90, on meds, no preeclampsia, AGA, nml AFV  20-28-34-38  20-24-28-32-35-38  32//2 x wk  28//BPP wkly then 32//2 x wk  40 no meds; 39 meds  PRN or 37  Family history of congenital heart disease  by Katrina StackWells, Deborah No   Overview Signed 11/18/2016  1:11 PM by Farrel ConnersGutierrez, Colleen, CNM    Seen by genetic counselor. Fetal echo scheduled      Adult  BMI 40.0-44.9 kg/sq m (HCC) 10/26/2016 by Farrel ConnersGutierrez, Colleen, CNM No   Supervision of high risk pregnancy, antepartum 10/12/2016 by Tresea MallGledhill, Skyeler Scalese, CNM No   Overview Addendum 04/12/2017 10:44 AM by Tresea MallGledhill, Nelida Mandarino, CNM    [facility] Prenatal Labs  Dating 9wk 4d ultrasound Blood type: B/Positive/-- (07/25 0952) B POS  Genetic Screen 1 Screen: neg      Antibody:Negative (07/25 0952)neg  Anatomic US Normal female Rubella: 1.50 (07/25 0952) Immune Varicella:   immune  GTT Early: 88 Third trimester: 83 RPR: Non Reactive (07/25 0952) negative  Rhogam  HBsAg: Negative (07/25 0952) non reactive  TDaP vaccine                       Flu Shot: 01/11/17 HIV:   negative  Baby Food                                GBS:   Contraception  Pap:10/12/16 NIL  CBB     CS/VBAC    Support Person Husband Terry       High risk due to [facility], hx of preeclampsia, morbid obesity, family hx of congenital heart disease, borderline hypothyroidism       Severe obesity (BMI >= 40) (HCC) 02/08/2015 by Conard NovakJackson, Stephen D, MD No   Hypothyroidism 02/08/2015 by Conard NovakJackson, Stephen D, MD No   Overview Signed 11/18/2016  1:19 PM by Farrel ConnersGutierrez, Colleen, CNM    Repeat TSH and free t4 at 16-18 weeks-if TSH higher or free T4 lower, start 50 mgm Synthroid      Maternal obesity, antepartum 10/26/2014 by Kirby FunkEllestad, Sarah, MD No       Term labor symptoms and general obstetric precautions including but not limited to vaginal bleeding, contractions, leaking of fluid and fetal movement were reviewed in detail with the patient.   Return for has induction on Sunday.  Tresea MallJane Martin Belling, CNM  05/17/2017 10:29 AM

## 2017-05-20 ENCOUNTER — Encounter: Payer: Self-pay | Admitting: *Deleted

## 2017-05-20 ENCOUNTER — Inpatient Hospital Stay
Admission: EM | Admit: 2017-05-20 | Discharge: 2017-05-22 | DRG: 807 | Disposition: A | Discharge status: Home / Self Care | Payer: BLUE CROSS/BLUE SHIELD | Attending: Certified Nurse Midwife | Admitting: Certified Nurse Midwife

## 2017-05-20 ENCOUNTER — Other Ambulatory Visit: Payer: Self-pay

## 2017-05-20 DIAGNOSIS — I10 Essential (primary) hypertension: Secondary | ICD-10-CM

## 2017-05-20 DIAGNOSIS — O9902 Anemia complicating childbirth: Secondary | ICD-10-CM | POA: Diagnosis present

## 2017-05-20 DIAGNOSIS — O10919 Unspecified pre-existing hypertension complicating pregnancy, unspecified trimester: Secondary | ICD-10-CM

## 2017-05-20 DIAGNOSIS — Z3490 Encounter for supervision of normal pregnancy, unspecified, unspecified trimester: Secondary | ICD-10-CM | POA: Diagnosis present

## 2017-05-20 DIAGNOSIS — Z3A39 39 weeks gestation of pregnancy: Secondary | ICD-10-CM

## 2017-05-20 DIAGNOSIS — Z7982 Long term (current) use of aspirin: Secondary | ICD-10-CM

## 2017-05-20 DIAGNOSIS — O0990 Supervision of high risk pregnancy, unspecified, unspecified trimester: Secondary | ICD-10-CM

## 2017-05-20 DIAGNOSIS — O9962 Diseases of the digestive system complicating childbirth: Secondary | ICD-10-CM | POA: Diagnosis present

## 2017-05-20 DIAGNOSIS — K219 Gastro-esophageal reflux disease without esophagitis: Secondary | ICD-10-CM | POA: Diagnosis present

## 2017-05-20 DIAGNOSIS — E039 Hypothyroidism, unspecified: Secondary | ICD-10-CM

## 2017-05-20 DIAGNOSIS — O99210 Obesity complicating pregnancy, unspecified trimester: Secondary | ICD-10-CM

## 2017-05-20 DIAGNOSIS — O1002 Pre-existing essential hypertension complicating childbirth: Secondary | ICD-10-CM | POA: Diagnosis present

## 2017-05-20 DIAGNOSIS — Z8279 Family history of other congenital malformations, deformations and chromosomal abnormalities: Secondary | ICD-10-CM

## 2017-05-20 DIAGNOSIS — Z3483 Encounter for supervision of other normal pregnancy, third trimester: Secondary | ICD-10-CM | POA: Diagnosis present

## 2017-05-20 DIAGNOSIS — D649 Anemia, unspecified: Secondary | ICD-10-CM | POA: Diagnosis present

## 2017-05-20 DIAGNOSIS — Z87891 Personal history of nicotine dependence: Secondary | ICD-10-CM | POA: Diagnosis not present

## 2017-05-20 DIAGNOSIS — Z6841 Body Mass Index (BMI) 40.0 and over, adult: Secondary | ICD-10-CM

## 2017-05-20 DIAGNOSIS — O1213 Gestational proteinuria, third trimester: Secondary | ICD-10-CM

## 2017-05-20 DIAGNOSIS — O99214 Obesity complicating childbirth: Secondary | ICD-10-CM | POA: Diagnosis present

## 2017-05-20 LAB — CBC
HEMATOCRIT: 33.9 % — AB (ref 35.0–47.0)
HEMOGLOBIN: 11.5 g/dL — AB (ref 12.0–16.0)
MCH: 28.8 pg (ref 26.0–34.0)
MCHC: 33.8 g/dL (ref 32.0–36.0)
MCV: 85.2 fL (ref 80.0–100.0)
Platelets: 251 10*3/uL (ref 150–440)
RBC: 3.98 MIL/uL (ref 3.80–5.20)
RDW: 16.2 % — ABNORMAL HIGH (ref 11.5–14.5)
WBC: 14.6 10*3/uL — ABNORMAL HIGH (ref 3.6–11.0)

## 2017-05-20 LAB — COMPREHENSIVE METABOLIC PANEL
ALK PHOS: 99 U/L (ref 38–126)
ALT: 9 U/L — ABNORMAL LOW (ref 14–54)
ANION GAP: 10 (ref 5–15)
AST: 17 U/L (ref 15–41)
Albumin: 3 g/dL — ABNORMAL LOW (ref 3.5–5.0)
BILIRUBIN TOTAL: 0.4 mg/dL (ref 0.3–1.2)
BUN: 9 mg/dL (ref 6–20)
CALCIUM: 8.8 mg/dL — AB (ref 8.9–10.3)
CO2: 20 mmol/L — ABNORMAL LOW (ref 22–32)
Chloride: 105 mmol/L (ref 101–111)
Creatinine, Ser: 0.54 mg/dL (ref 0.44–1.00)
GLUCOSE: 98 mg/dL (ref 65–99)
Potassium: 3.8 mmol/L (ref 3.5–5.1)
Sodium: 135 mmol/L (ref 135–145)
TOTAL PROTEIN: 7.2 g/dL (ref 6.5–8.1)

## 2017-05-20 LAB — TYPE AND SCREEN
ABO/RH(D): B POS
Antibody Screen: NEGATIVE

## 2017-05-20 LAB — PROTEIN / CREATININE RATIO, URINE
CREATININE, URINE: 153 mg/dL
PROTEIN CREATININE RATIO: 0.09 mg/mg{creat} (ref 0.00–0.15)
TOTAL PROTEIN, URINE: 14 mg/dL

## 2017-05-20 MED ORDER — TERBUTALINE SULFATE 1 MG/ML IJ SOLN: 0.2500 mg | Freq: Once | INTRAMUSCULAR | Status: DC | PRN

## 2017-05-20 MED ORDER — AMMONIA AROMATIC IN INHA
RESPIRATORY_TRACT | Status: AC
  Filled 2017-05-20: qty 10

## 2017-05-20 MED ORDER — LIDOCAINE HCL (PF) 1 % IJ SOLN
INTRAMUSCULAR | Status: AC
  Filled 2017-05-20: qty 30

## 2017-05-20 MED ORDER — LACTATED RINGERS IV SOLN
INTRAVENOUS | Status: DC
  Administered 2017-05-20 (×2): via INTRAVENOUS

## 2017-05-20 MED ORDER — LACTATED RINGERS IV SOLN: 500.0000 mL | INTRAVENOUS | Status: DC | PRN

## 2017-05-20 MED ORDER — OXYTOCIN 10 UNIT/ML IJ SOLN
INTRAMUSCULAR | Status: AC
  Filled 2017-05-20: qty 2

## 2017-05-20 MED ORDER — MISOPROSTOL 25 MCG QUARTER TABLET
25.0000 ug | ORAL_TABLET | ORAL | Status: DC
  Administered 2017-05-20 (×2): 25 ug via BUCCAL
  Filled 2017-05-20 (×2): qty 1

## 2017-05-20 MED ORDER — OXYTOCIN BOLUS FROM INFUSION
500.0000 mL | Freq: Once | INTRAVENOUS | Status: AC
  Administered 2017-05-21: 500 mL via INTRAVENOUS

## 2017-05-20 MED ORDER — OXYTOCIN 40 UNITS IN LACTATED RINGERS INFUSION - SIMPLE MED
INTRAVENOUS | Status: AC
  Administered 2017-05-20: 2 m[IU]/min via INTRAVENOUS
  Filled 2017-05-20: qty 1000

## 2017-05-20 MED ORDER — MISOPROSTOL 200 MCG PO TABS
ORAL_TABLET | ORAL | Status: AC
  Administered 2017-05-20: 25 ug via BUCCAL
  Filled 2017-05-20: qty 4

## 2017-05-20 MED ORDER — OXYTOCIN 40 UNITS IN LACTATED RINGERS INFUSION - SIMPLE MED: 2.5000 [IU]/h | INTRAVENOUS | Status: DC

## 2017-05-20 MED ORDER — LIDOCAINE HCL (PF) 1 % IJ SOLN: 30.0000 mL | INTRAMUSCULAR | Status: DC | PRN

## 2017-05-20 MED ORDER — OXYTOCIN 40 UNITS IN LACTATED RINGERS INFUSION - SIMPLE MED
1.0000 m[IU]/min | INTRAVENOUS | Status: DC
  Administered 2017-05-20: 2 m[IU]/min via INTRAVENOUS
  Filled 2017-05-20: qty 1000

## 2017-05-20 MED ORDER — ONDANSETRON HCL 4 MG/2ML IJ SOLN
4.0000 mg | Freq: Four times a day (QID) | INTRAMUSCULAR | Status: DC | PRN
  Administered 2017-05-21: 4 mg via INTRAVENOUS
  Filled 2017-05-20: qty 2

## 2017-05-20 NOTE — H&P (Signed)
OB History & Physical   History of Present Illness:  Chief Complaint:  "I am here to have my baby." HPI:  Debra Bender is a 32 y.o. 392P1001 female with EDC=05/27/2017 at 1329w0d dated by a 9wk4d ultrasound.  Her pregnancy has been complicated by [facility] (well controlled blood pressures on Norvasc 5 mgm), obesity (BMI>40), history of preeclampsia, history of hypothyroidism (TSH and free T4s have been normal this pregnancy-no medications), GERD with gastritis, migraine headaches, and family history of congenital heart disease. She had genetic counseling and a normal fetal echo. Has been followed with antenatal testing in her third trimester and growth scans. Her antenatal testing has been reassuring and her last EFW was 7#1oz on 04/26/2017.  Baseline PC ratio was 328 mgm and 24 hour urine was 257 mgm protein. TWG=43# She presents to L&D for induction of labor.  She reports good FM. Has been having bouts of regular contractions. Denies vaginal bleeding or leakage of fluid. Denied headache, epigastric pain, visual changes.     [facility]  [facility] Prenatal Labs  Dating 9wk 4d ultrasound Blood type: B/Positive/-- (07/25 0952) B POS  Genetic Screen 1 Screen: neg      Antibody:Negative (07/25 0952)neg  Anatomic US Normal female Rubella: 1.50 (07/25 0952) Immune Varicella:   immune  GTT Early: 88 Third trimester: 83 RPR: Non Reactive (07/25 0952) negative  Rhogam NA HBsAg: Negative (07/25 0952) non reactive  TDaP vaccine    03/23/2017                   Flu Shot: 01/11/17 HIV:   negative  Baby Food          Breast                      GBS: negative  Contraception Vas ?Depo Pap:10/12/16 NIL  CBB     CS/VBAC    Support Person Husband Aurther Lofterry       High risk due to [facility], hx of preeclampsia, morbid obesity, family hx of congenital heart disease, borderline hypothyroidism      Maternal Medical History:   Past Medical History:   Diagnosis Date  . Anemia 12/2014  . GERD (gastroesophageal reflux disease)   . Hypertension   . Hypothyroidism   . Obesity affecting pregnancy     Past Surgical History:  Procedure Laterality Date  . FRACTURE SURGERY Right 32 years old    Allergies  Allergen Reactions  . Hydrochlorothiazide Other (See Comments)    Elevated creatinine  . Latex Itching and Rash    Prior to Admission medications   Medication Sig Start Date End Date Taking? Authorizing Provider  amLODipine (NORVASC) 5 MG tablet  05/05/17  Yes [provider]  aspirin EC 81 MG tablet Take 81 mg by mouth daily.   Yes [provider]  Butalbital-APAP-Caffeine 50-325-40 MG capsule Take 1-2 capsules by mouth every 6 (six) hours as needed for headache. 03/09/17  Yes Tresea MallGledhill, Jane, CNM  folic acid (FOLVITE) 1 MG tablet TAKE 1 TABLET BY MOUTH EVERY DAY 03/19/17  Yes Farrel ConnersGutierrez, Star Cheese, CNM  Prenatal Vit-Fe Fumarate-FA (PRENATAL MULTIVITAMIN) TABS tablet Take 1 tablet by mouth daily at 12 noon.   Yes [provider]  ranitidine (ZANTAC) 300 MG tablet 300 mg 2 (two) times daily.  07/28/16  Yes [provider]  Acetaminophen (MAPAP) 500 MG coapsule Take by mouth.    [provider]       Social History: She  reports that she quit smoking about 2 years ago. she has never used smokeless tobacco. She reports that she does not drink alcohol or use drugs.  Family History: family history includes Asthma in her brother; Cirrhosis in her paternal grandmother; Depression in her mother; Diabetes in her maternal grandfather; Emphysema in her maternal grandmother; Heart disease in her father and paternal grandfather.   Review of Systems: Negative x 10 systems reviewed except as noted in the HPI.      Physical Exam:  Vital Signs: Temp 98.1 F (36.7 C) (Oral)   Resp 16   Ht 5' 4.5" (1.638 m)   Wt 117.9 kg (260 lb)   LMP 08/11/2016   BMI 43.94 kg/m   Blood pressures: 132/94,  137/79 General: gravid WF in no acute distress.  HEENT: normocephalic, atraumatic Heart: regular rate & rhythm.  No murmurs/rubs/gallops Lungs: clear to auscultation bilaterally Abdomen: soft, gravid, non-tender;  EFW: 8-8 1/2# Pelvic:   External: Normal external female genitalia  Cervix:3/60%/-1   Extremities: non-tender, symmetric, no edema bilaterally.  DTRs: +2 on left and +3 on right patellar  Neurologic: Alert & oriented x 3.   Baseline FHR: 135-140 baseline with accelerations to 160s, moderate variability:  Toco: occasional contraction Bedside Ultrasound:  Presentation: cephalic/ROA    Assessment:  Debra Bender is a 32 y.o. G4P1001 female at [redacted]w[redacted]d admitted for induction of labor 2/2 CHTN/ obesity with BMI>40  Mild to normal range blood pressures on Norvasc FWB: Cat 1 tracing  Plan:  1. Admit to Labor & Delivery  2. CBC, T&S, Clrs, IVF, CMP, PC ratio, RPR 3. GBS negative.   4. Consents obtained. 5. B POS/RI/ VI 6. TDAP and flu vaccine UTD 7. Breast 8. Contraception: Vasectomy, ?Depo bridge 9. Epidural when active. Stadol for pain if desires  Results for orders placed or performed during the hospital encounter of 05/20/17 (from the past 24 hour(s))  CBC     Status: Abnormal   Collection Time: 05/20/17  9:35 AM  Result Value Ref Range   WBC 14.6 (H) 3.6 - 11.0 K/uL   RBC 3.98 3.80 - 5.20 MIL/uL   Hemoglobin 11.5 (L) 12.0 - 16.0 g/dL   HCT 40.9 (L) 81.1 - 91.4 %   MCV 85.2 80.0 - 100.0 fL   MCH 28.8 26.0 - 34.0 pg   MCHC 33.8 32.0 - 36.0 g/dL   RDW 78.2 (H) 95.6 - 21.3 %   Platelets 251 150 - 440 K/uL  Comprehensive metabolic panel     Status: Abnormal   Collection Time: 05/20/17  9:35 AM  Result Value Ref Range   Sodium 135 135 - 145 mmol/L   Potassium 3.8 3.5 - 5.1 mmol/L   Chloride 105 101 - 111 mmol/L   CO2 20 (L) 22 - 32 mmol/L   Glucose, Bld 98 65 - 99 mg/dL   BUN 9 6 - 20 mg/dL   Creatinine, Ser 0.86 0.44 - 1.00 mg/dL   Calcium 8.8 (L) 8.9 -  10.3 mg/dL   Total Protein 7.2 6.5 - 8.1 g/dL   Albumin 3.0 (L) 3.5 - 5.0 g/dL   AST 17 15 - 41 U/L   ALT 9 (L) 14 - 54 U/L   Alkaline Phosphatase 99 38 - 126 U/L   Total Bilirubin 0.4 0.3 - 1.2 mg/dL   GFR calc non Af Amer >60 >60 mL/min   GFR calc Af Amer >60 >60 mL/min  Anion gap 10 5 - 15     Farrel Conners  05/20/2017 9:39 AM

## 2017-05-20 NOTE — Progress Notes (Addendum)
L&D Note   S: Has received 2 doses of Cytotec 25 mcg, but contractions are not painful. Had regular diet for supper  O: BP 129/82   Pulse 94   Temp 98.5 F (36.9 C) (Oral)   Resp 16   Ht 5' 4.5" (1.638 m)   Wt 117.9 kg (260 lb)   LMP 08/11/2016   BMI 43.94 kg/m    FHR: 135 with accelerations to 160s to 170, moderate variability Toco: contractions q2-6 min apart, mild  Cervix: 3/60%/-1/posterior  A: No cervical change after 2 doses of Cytotec CHTN: Normotensive FWB: reactive, Cat 1 tracing  P: Start Pitocin Epidural when more active Consider AROM when progressing after epidural.   Debra Bender, CNM

## 2017-05-21 ENCOUNTER — Inpatient Hospital Stay: Payer: BLUE CROSS/BLUE SHIELD | Admitting: Anesthesiology

## 2017-05-21 ENCOUNTER — Encounter: Payer: Self-pay | Admitting: Anesthesiology

## 2017-05-21 DIAGNOSIS — Z3A39 39 weeks gestation of pregnancy: Secondary | ICD-10-CM

## 2017-05-21 DIAGNOSIS — O1002 Pre-existing essential hypertension complicating childbirth: Secondary | ICD-10-CM

## 2017-05-21 MED ORDER — LACTATED RINGERS IV SOLN: 500.0000 mL | Freq: Once | INTRAVENOUS | Status: DC

## 2017-05-21 MED ORDER — PHENYLEPHRINE 40 MCG/ML (10ML) SYRINGE FOR IV PUSH (FOR BLOOD PRESSURE SUPPORT)
80.0000 ug | PREFILLED_SYRINGE | INTRAVENOUS | Status: DC | PRN
  Filled 2017-05-21: qty 5

## 2017-05-21 MED ORDER — BUPIVACAINE HCL (PF) 0.25 % IJ SOLN
INTRAMUSCULAR | Status: DC | PRN
  Administered 2017-05-21: 10 mL via EPIDURAL

## 2017-05-21 MED ORDER — FENTANYL 2.5 MCG/ML W/ROPIVACAINE 0.15% IN NS 100 ML EPIDURAL (ARMC)
EPIDURAL | Status: AC
  Filled 2017-05-21: qty 100

## 2017-05-21 MED ORDER — DOCUSATE SODIUM 100 MG PO CAPS
100.0000 mg | ORAL_CAPSULE | Freq: Every day | ORAL | Status: DC
  Administered 2017-05-21 – 2017-05-22 (×2): 100 mg via ORAL
  Filled 2017-05-21 (×2): qty 1

## 2017-05-21 MED ORDER — EPHEDRINE 5 MG/ML INJ
10.0000 mg | INTRAVENOUS | Status: DC | PRN
  Filled 2017-05-21: qty 2

## 2017-05-21 MED ORDER — PRENATAL MULTIVITAMIN CH
1.0000 | ORAL_TABLET | Freq: Every day | ORAL | Status: DC
  Administered 2017-05-21 – 2017-05-22 (×2): 1 via ORAL
  Filled 2017-05-21 (×2): qty 1

## 2017-05-21 MED ORDER — COCONUT OIL OIL
1.0000 "application " | TOPICAL_OIL | Status: DC | PRN
  Administered 2017-05-21: 1 via TOPICAL
  Filled 2017-05-21: qty 120

## 2017-05-21 MED ORDER — ONDANSETRON HCL 4 MG/2ML IJ SOLN: 4.0000 mg | INTRAMUSCULAR | Status: DC | PRN

## 2017-05-21 MED ORDER — BENZOCAINE-MENTHOL 20-0.5 % EX AERO
1.0000 "application " | INHALATION_SPRAY | CUTANEOUS | Status: DC | PRN
  Administered 2017-05-21: 1 via TOPICAL
  Filled 2017-05-21: qty 56

## 2017-05-21 MED ORDER — MISOPROSTOL 200 MCG PO TABS
200.0000 ug | ORAL_TABLET | Freq: Once | ORAL | Status: AC
  Administered 2017-05-21: 200 ug via VAGINAL

## 2017-05-21 MED ORDER — DIBUCAINE 1 % RE OINT: 1.0000 "application " | TOPICAL_OINTMENT | RECTAL | Status: DC | PRN

## 2017-05-21 MED ORDER — LIDOCAINE HCL (PF) 1 % IJ SOLN
INTRAMUSCULAR | Status: DC | PRN
  Administered 2017-05-21: 3 mL

## 2017-05-21 MED ORDER — IBUPROFEN 600 MG PO TABS
600.0000 mg | ORAL_TABLET | Freq: Four times a day (QID) | ORAL | Status: DC
  Administered 2017-05-21 – 2017-05-22 (×5): 600 mg via ORAL
  Filled 2017-05-21 (×5): qty 1

## 2017-05-21 MED ORDER — DIPHENHYDRAMINE HCL 50 MG/ML IJ SOLN: 12.5000 mg | INTRAMUSCULAR | Status: DC | PRN

## 2017-05-21 MED ORDER — FERROUS SULFATE 325 (65 FE) MG PO TABS
325.0000 mg | ORAL_TABLET | Freq: Every day | ORAL | Status: DC
  Administered 2017-05-22: 325 mg via ORAL
  Filled 2017-05-21: qty 1

## 2017-05-21 MED ORDER — ONDANSETRON HCL 4 MG PO TABS: 4.0000 mg | ORAL_TABLET | ORAL | Status: DC | PRN

## 2017-05-21 MED ORDER — ACETAMINOPHEN 500 MG PO TABS
1000.0000 mg | ORAL_TABLET | Freq: Four times a day (QID) | ORAL | Status: DC | PRN
  Administered 2017-05-21 – 2017-05-22 (×4): 1000 mg via ORAL
  Filled 2017-05-21 (×4): qty 2

## 2017-05-21 MED ORDER — FAMOTIDINE 20 MG PO TABS
20.0000 mg | ORAL_TABLET | Freq: Two times a day (BID) | ORAL | Status: DC
  Administered 2017-05-21 – 2017-05-22 (×3): 20 mg via ORAL
  Filled 2017-05-21 (×3): qty 1

## 2017-05-21 MED ORDER — AMLODIPINE BESYLATE 5 MG PO TABS
5.0000 mg | ORAL_TABLET | Freq: Every day | ORAL | Status: DC
  Administered 2017-05-22: 5 mg via ORAL
  Filled 2017-05-21: qty 1

## 2017-05-21 MED ORDER — SIMETHICONE 80 MG PO CHEW: 80.0000 mg | CHEWABLE_TABLET | ORAL | Status: DC | PRN

## 2017-05-21 MED ORDER — LIDOCAINE-EPINEPHRINE (PF) 1.5 %-1:200000 IJ SOLN
INTRAMUSCULAR | Status: DC | PRN
  Administered 2017-05-21: 3 mL via PERINEURAL

## 2017-05-21 MED ORDER — FENTANYL 2.5 MCG/ML W/ROPIVACAINE 0.15% IN NS 100 ML EPIDURAL (ARMC)
12.0000 mL/h | EPIDURAL | Status: DC
  Administered 2017-05-21: 12 mL/h via EPIDURAL

## 2017-05-21 MED ORDER — WITCH HAZEL-GLYCERIN EX PADS
1.0000 "application " | MEDICATED_PAD | CUTANEOUS | Status: DC | PRN
  Administered 2017-05-21: 1 via TOPICAL
  Filled 2017-05-21: qty 100

## 2017-05-21 NOTE — Progress Notes (Signed)
Pt called out to nurse's station, states she thinks her water broke and feeling more pain in pelvis. RN in room to assess pt, pads damp but not soaked, changed, pt repositioned side to side, no increased leaking noted, + mucus discharge, nitrazine neg. SVE performed, 6cm. Pt pressing Epidural button for dose of pain med.

## 2017-05-21 NOTE — Discharge Instructions (Signed)
°Vaginal Delivery, Care After °Refer to this sheet in the next few weeks. These discharge instructions provide you with information on caring for yourself after delivery. Your caregiver may also give you specific instructions. Your treatment has been planned according to the most current medical practices available, but problems sometimes occur. Call your caregiver if you have any problems or questions after you go home. °HOME CARE INSTRUCTIONS °1. Take over-the-counter or prescription medicines only as directed by your caregiver or pharmacist. °2. Do not drink alcohol, especially if you are breastfeeding or taking medicine to relieve pain. °3. Do not smoke tobacco. °4. Continue to use good perineal care. Good perineal care includes: °1. Wiping your perineum from back to front °2. Keeping your perineum clean. °3. You can do sitz baths twice a day, to help keep this area clean °5. Do not use tampons, douche or have sex for 6 weeks °6. Shower only and avoid sitting in submerged water, aside from sitz baths °7. Wear a well-fitting bra that provides breast support. °8. Eat healthy foods. °9. Drink enough fluids to keep your urine clear or pale yellow. °10. Eat high-fiber foods such as whole grain cereals and breads, brown rice, beans, and fresh fruits and vegetables every day. These foods may help prevent or relieve constipation. °11. Avoid constipation with high fiber foods or medications, such as miralax or metamucil °12. Follow your caregiver's recommendations regarding resumption of activities such as climbing stairs, driving, lifting, exercising, or traveling. °13. Talk to your caregiver about resuming sexual activities. Resumption of sexual activities after 6 weeks is dependent upon your risk of infection, your rate of healing, and your comfort and desire to resume sexual activity. °14. Try to have someone help you with your household activities and your newborn for at least a few days after you leave the  hospital. °15. Rest as much as possible. Try to rest or take a nap when your newborn is sleeping. °16. Increase your activities gradually. °17. Keep all of your scheduled postpartum appointments. It is very important to keep your scheduled follow-up appointments. At these appointments, your caregiver will be checking to make sure that you are healing physically and emotionally. °SEEK MEDICAL CARE IF:  °· You are passing large clots from your vagina. Save any clots to show your caregiver. °· You have a foul smelling discharge from your vagina. °· You have trouble urinating. °· You are urinating frequently. °· You have pain when you urinate. °· You have a change in your bowel movements. °· You have increasing redness, pain, or swelling near your vaginal incision (episiotomy) or vaginal tear. °· You have pus draining from your episiotomy or vaginal tear. °· Your episiotomy or vaginal tear is separating. °· You have painful, hard, or reddened breasts. °· You have a severe headache. °· You have blurred vision or see spots. °· You feel sad or depressed. °· You have thoughts of hurting yourself or your newborn. °· You have questions about your care, the care of your newborn, or medicines. °· You are dizzy or light-headed. °· You have a rash. °· You have nausea or vomiting. °· You were breastfeeding and have not had a menstrual period within 12 weeks after you stopped breastfeeding. °· You are not breastfeeding and have not had a menstrual period by the 12th week after delivery. °· You have a fever of 100.5 or more °SEEK IMMEDIATE MEDICAL CARE IF:  °· You have persistent pain. °· You have chest pain. °· You have shortness   of breath. °· You faint. °· You have leg pain. °· You have stomach pain. °· Your vaginal bleeding saturates two or more sanitary pads in 1 hour. °MAKE SURE YOU:  °· Understand these instructions. °· Will watch your condition. °· Will get help right away if you are not doing well or get worse. °Document  Released: 03/17/2000 Document Revised: 08/04/2013 Document Reviewed: 11/15/2011 °ExitCare® Patient Information ©2015 [employer]. This information is not intended to replace advice given to you by your health care provider. Make sure you discuss any questions you have with your health care provider. ° °Sitz Bath °A sitz bath is a warm water bath taken in the sitting position. The water covers only the hips and butt (buttocks). We recommend using one that fits in the toilet, to help with ease of use and cleanliness. It may be used for either healing or cleaning purposes. Sitz baths are also used to relieve pain, itching, or muscle tightening (spasms). The water may contain medicine. Moist heat will help you heal and relax.  °HOME CARE  °Take 3 to 4 sitz baths a day. °18. Fill the bathtub half-full with warm water. °19. Sit in the water and open the drain a little. °20. Turn on the warm water to keep the tub half-full. Keep the water running constantly. °21. Soak in the water for 15 to 20 minutes. °22. After the sitz bath, pat the affected area dry. °GET HELP RIGHT AWAY IF: °You get worse instead of better. Stop the sitz baths if you get worse. °MAKE SURE YOU: °· Understand these instructions. °· Will watch your condition. °· Will get help right away if you are not doing well or get worse. °Document Released: 04/27/2004 Document Revised: 12/13/2011 Document Reviewed: 07/18/2010 °ExitCare® Patient Information ©2015 [employer]. This information is not intended to replace advice given to you by your health care provider. Make sure you discuss any questions you have with your health care provider. ° ° °

## 2017-05-21 NOTE — Lactation Note (Signed)
This note was copied from a baby's chart. Lactation Consultation Note  Patient Name: Debra Bender Today's Date: 05/21/2017 Reason for consult: Initial assessment I did not observe this feeding, pt states that baby had latched well and she heard swallows  Maternal Data Formula Feeding for Exclusion: No Does the patient have breastfeeding experience prior to this delivery?: Yes  Feeding Feeding Type: Breast Fed Length of feed: 15 min  LATCH Score Latch: Grasps breast easily, tongue down, lips flanged, rhythmical sucking.  Audible Swallowing: Spontaneous and intermittent  Type of Nipple: Everted at rest and after stimulation  Comfort (Breast/Nipple): Soft / non-tender  Hold (Positioning): No assistance needed to correctly position infant at breast.  LATCH Score: 10  Interventions Interventions: (I did not observe this feeding)  Lactation Tools Discussed/Used WIC Program: No   Consult Status Consult Status: PRN    Debra Bender 05/21/2017, 1:25 PM

## 2017-05-21 NOTE — Anesthesia Procedure Notes (Signed)
Epidural Patient location during procedure: OB Start time: 05/21/2017 1:44 AM End time: 05/21/2017 1:56 AM  Staffing Anesthesiologist: Yves Dillarroll, Shatika Grinnell, MD Performed: anesthesiologist   Preanesthetic Checklist Completed: patient identified, site marked, surgical consent, pre-op evaluation, timeout performed, IV checked, risks and benefits discussed and monitors and equipment checked  Epidural Patient position: sitting Prep: Betadine Patient monitoring: heart rate, continuous pulse ox and blood pressure Approach: midline Location: L3-L4 Injection technique: LOR air  Needle:  Needle type: Tuohy  Needle gauge: 17 G Needle length: 9 cm and 9 Catheter type: closed end flexible Catheter size: 19 Gauge Test dose: negative and 1.5% lidocaine with Epi 1:200 K  Assessment Events: blood not aspirated, injection not painful, no injection resistance, negative IV test and no paresthesia  Additional Notes Time out called .  Patient placed in sitting position.  Back prepped and draped in sterile fashion.  A skin wheal was made in the L3-L4 interspace with 1% Lidocaine plain.  A 17G Tuohy needle was advanced into the epidural space by a loss of resistance technique.  The epidural catheter was threaded 3 cm into the space and the TD was negative. No blood or paresthesias..  The patient tolerated the procedure well.  The catheter was affixed to the back in sterile fashion.Reason for block:procedure for pain

## 2017-05-21 NOTE — Discharge Summary (Signed)
Physician Obstetric Discharge Summary  Patient ID: Debra Bender MRN: [id number] DOB/AGE: [birth year] 32 y.o.   Date of Admission: 05/20/2017  Date of Discharge: 05/22/2017  Admitting Diagnosis: Induction of labor at 291w1d  Secondary Diagnosis: Chronic hypertension  and obesity  Mode of Delivery: normal spontaneous vaginal delivery 05/21/2017       Discharge Diagnosis: Term intrauterine pregnancy-delivered, Shoulder dystocia. Periurethral laceration   Intrapartum Procedures: epidural, placement of intrauterine catheter and cytotec induction, Pitocin augmentation, and repair of periurethral laceration   Post partum procedures: none  Complications: shoulder dystocia   Brief Hospital Course  Debra Bender is a [id number] who had a SVD on 05/21/2017 after [facility] and [facility];  for further details of this delivery, please refer to the delivery note. THe delivery was remarkable for shoulder dystocia resolved with McRobert's and suprapubic pressure. Patient had an uncomplicated postpartum course.  By time of discharge on PPD#1, her pain was controlled on oral pain medications; she had appropriate lochia and was ambulating, voiding without difficulty and tolerating regular diet.  Blood pressures were well controlled on Norvasc 5 mgm. She was deemed stable for discharge to home.     Labs: CBC Latest Ref Rng & Units 05/22/2017 05/20/2017 03/09/2017  WBC 3.6 - 11.0 K/uL 14.6(H) 14.6(H) 12.9(H)  Hemoglobin 12.0 - 16.0 g/dL 11.2(L) 11.5(L) 10.7(L)  Hematocrit 35.0 - 47.0 % 33.5(L) 33.9(L) 33.1(L)  Platelets 150 - 440 K/uL 245 251 269   B POS/ RI/ VI/ GBS negative  Physical exam:  Blood pressure 131/89, pulse 74, temperature 98.1 F (36.7 C), temperature source Oral, resp. rate 18, height 5' 4.5" (1.638 m), weight 117.9 kg (260 lb), last menstrual period 08/11/2016, SpO2 97 %, unknown if currently breastfeeding. General: alert and no distress Lochia:  appropriate Abdomen: soft, NT Uterine Fundus: firm Extremities: No evidence of DVT seen on physical exam. Trace lower extremity edema.  Discharge Instructions: Per After Visit Summary. Activity: Advance as tolerated. Pelvic rest for 6 weeks.  Also refer to Discharge Instructions Diet: Regular Medications: Allergies as of 05/22/2017      Reactions   Hydrochlorothiazide Other (See Comments)   Elevated creatinine   Latex Itching, Rash      Medication List    STOP taking these medications   aspirin EC 81 MG tablet   folic acid 1 MG tablet Commonly known as:  FOLVITE     TAKE these medications   amLODipine 5 MG tablet Commonly known as:  NORVASC   Butalbital-APAP-Caffeine 50-325-40 MG capsule Take 1-2 capsules by mouth every 6 (six) hours as needed for headache.   ibuprofen 600 MG tablet Commonly known as:  ADVIL,MOTRIN Take 1 tablet (600 mg total) by mouth every 6 (six) hours as needed.   MAPAP 500 MG coapsule Generic drug:  Acetaminophen Take by mouth.   prenatal multivitamin Tabs tablet Take 1 tablet by mouth daily at 12 noon.   ranitidine 300 MG tablet Commonly known as:  ZANTAC Take 1 tablet (300 mg total) by mouth at bedtime. What changed:    how to take this  when to take this      Outpatient follow up:  Follow-up Information    Farrel ConnersGutierrez, Eyan Hagood, CNM. Schedule an appointment as soon as possible for a visit.   Specialty:  Certified Nurse Midwife Why:  for 6 week postpartum check up Contact information: [address] [place] [address] [phone]          Postpartum contraception: vasectomy  Discharged Condition: stable  Discharged to: home  Fiollow up in one week for blood pressure check  Newborn Data: Olivia/ 8#13.8oz Disposition:home with mother  Apgars: APGAR (1 MIN): 7   APGAR (5 MINS): 9   APGAR (10 MINS):    Baby Feeding: Breast  Farrel Conners, CNM 05/22/2017 10:04 AM

## 2017-05-21 NOTE — Anesthesia Preprocedure Evaluation (Signed)
Anesthesia Evaluation  Patient identified by MRN, date of birth, ID band Patient awake    Reviewed: Allergy & Precautions, H&P , NPO status , Patient's Chart, lab work & pertinent test results  History of Anesthesia Complications Negative for: history of anesthetic complications  Airway Mallampati: III  TM Distance: >3 FB Neck ROM: full    Dental no notable dental hx. (+) Teeth Intact   Pulmonary former smoker,    Pulmonary exam normal breath sounds clear to auscultation       Cardiovascular Exercise Tolerance: Good hypertension, Normal cardiovascular exam Rhythm:regular Rate:Normal     Neuro/Psych negative neurological ROS  negative psych ROS   GI/Hepatic negative GI ROS, Neg liver ROS,   Endo/Other  Hypothyroidism Morbid obesity  Renal/GU negative Renal ROS  negative genitourinary   Musculoskeletal   Abdominal   Peds  Hematology negative hematology ROS (+) anemia ,   Anesthesia Other Findings Past Medical History:   Obesity affecting pregnancy                                  Hypothyroidism                                  12/2014       Anemia                                          12/2014      Past Surgical History:   FRACTURE SURGERY                                Right 8 years o*  BMI    Body Mass Index   46.32 kg/m 2      Reproductive/Obstetrics (+) Pregnancy                             Anesthesia Physical  Anesthesia Plan  ASA: III  Anesthesia Plan: Epidural   Post-op Pain Management:    Induction:   PONV Risk Score and Plan:   Airway Management Planned:   Additional Equipment:   Intra-op Plan:   Post-operative Plan:   Informed Consent: I have reviewed the patients History and Physical, chart, labs and discussed the procedure including the risks, benefits and alternatives for the proposed anesthesia with the patient or authorized representative who has  indicated his/her understanding and acceptance.   Dental Advisory Given  Plan Discussed with: Anesthesiologist, CRNA and Surgeon  Anesthesia Plan Comments:         Anesthesia Quick Evaluation

## 2017-05-22 LAB — FETAL NONSTRESS TEST

## 2017-05-22 LAB — CBC
HCT: 33.5 % — ABNORMAL LOW (ref 35.0–47.0)
Hemoglobin: 11.2 g/dL — ABNORMAL LOW (ref 12.0–16.0)
MCH: 28.9 pg (ref 26.0–34.0)
MCHC: 33.4 g/dL (ref 32.0–36.0)
MCV: 86.6 fL (ref 80.0–100.0)
PLATELETS: 245 10*3/uL (ref 150–440)
RBC: 3.87 MIL/uL (ref 3.80–5.20)
RDW: 16.1 % — AB (ref 11.5–14.5)
WBC: 14.6 10*3/uL — AB (ref 3.6–11.0)

## 2017-05-22 LAB — RPR: RPR Ser Ql: NONREACTIVE

## 2017-05-22 MED ORDER — RANITIDINE HCL 300 MG PO TABS: 300.0000 mg | ORAL_TABLET | Freq: Every day | ORAL | Status: DC

## 2017-05-22 MED ORDER — IBUPROFEN 600 MG PO TABS: 600.0000 mg | ORAL_TABLET | Freq: Four times a day (QID) | ORAL | 1 refills | Status: DC | PRN

## 2017-05-22 NOTE — Anesthesia Postprocedure Evaluation (Signed)
Anesthesia Post Note  Patient: Debra Bender  Procedure(s) Performed: AN AD HOC LABOR EPIDURAL  Patient location during evaluation: Mother Baby Anesthesia Type: Epidural Level of consciousness: awake and alert Pain management: pain level controlled Vital Signs Assessment: post-procedure vital signs reviewed and stable Respiratory status: spontaneous breathing, nonlabored ventilation and respiratory function stable Cardiovascular status: stable Postop Assessment: no headache, no backache and epidural receding Anesthetic complications: no     Last Vitals:  Vitals:   05/21/17 1900 05/22/17 0059  BP: 130/60 (!) 105/51  Pulse: 86 75  Resp: 20 20  Temp: 36.6 C 36.7 C  SpO2: 99% 98%    Last Pain:  Vitals:   05/22/17 0600  TempSrc:   PainSc: 2                  Jules SchickLogan,  Tahra Hitzeman P

## 2017-05-22 NOTE — Progress Notes (Signed)
Post Partum Day 1 Subjective: up ad lib, voiding and tolerating PO. Tired-did not get much sleep last night. Breast feeding going well.  Objective: Blood pressure 131/89, pulse 74, temperature 98.1 F (36.7 C), temperature source Oral, resp. rate 18, height 5' 4.5" (1.638 m), weight 117.9 kg (260 lb), last menstrual period 08/11/2016, SpO2 97 %, unknown if currently breastfeeding.  Physical Exam:  General: alert, cooperative and no distress Lochia: appropriate Uterine Fundus: firm/ NT/ML/U-1  DVT Evaluation: No evidence of DVT seen on physical exam.  Recent Labs    05/20/17 0935 05/22/17 0446  HGB 11.5* 11.2*  HCT 33.9* 33.5*  WBC 14.6* 14.6*  PLT 251 245    Assessment/Plan: Stable PPD #1 Discharge home per patient request B POS/RI/VI Breast Vasectomy TDAP UTD  Farrel Connersolleen Ventura Leggitt, CNM    LOS: 2 days   Farrel Connersolleen Leo Weyandt 05/22/2017, 9:54 AM

## 2017-05-22 NOTE — Progress Notes (Signed)
Patient discharged home with infant. Discharge instructions and prescriptions given and reviewed with patient. Patient verbalized understanding. Escorted out by auxillary.  

## 2017-05-29 ENCOUNTER — Ambulatory Visit (INDEPENDENT_AMBULATORY_CARE_PROVIDER_SITE_OTHER): Payer: BLUE CROSS/BLUE SHIELD | Admitting: Certified Nurse Midwife

## 2017-05-29 ENCOUNTER — Encounter: Payer: Self-pay | Admitting: Certified Nurse Midwife

## 2017-05-29 DIAGNOSIS — I10 Essential (primary) hypertension: Secondary | ICD-10-CM

## 2017-06-04 NOTE — Progress Notes (Signed)
  History of Present Illness:  Debra Bender is a 32 y.o. is a G2 P2002 who had a vaginal delivery of a 8#13 oz female baby Zollie Scale(Olivia) on 05/21/2017. Her labor was induced due to her history of CHTN which remained well controlled on Norvasc 5 mgm during her pregnancy. She presents for a blood pressure check today. She reports that she has had no problems with headaches, visual changes, chest pain or SOB since her delivery. Bleeding is slowing. Breast feeding is going well. PMHx: She  has a past medical history of Anemia (12/2014), GERD (gastroesophageal reflux disease), Hypertension, Hypothyroidism, and Obesity affecting pregnancy. Also,  has a past surgical history that includes Fracture surgery (Right, 32 years old)., family history includes Asthma in her brother; Cirrhosis in her paternal grandmother; Depression in her mother; Diabetes in her maternal grandfather; Emphysema in her maternal grandmother; Heart disease in her father and paternal grandfather.,  reports that she quit smoking about 2 years ago. she has never used smokeless tobacco. She reports that she does not drink alcohol or use drugs.  She has a current medication list which includes the following prescription(s): acetaminophen, amlodipine, butalbital-apap-caffeine, ibuprofen, prenatal multivitamin, and ranitidine. Also, is allergic to hydrochlorothiazide and latex.  ROS  Physical Exam:  BP 120/80   Pulse 78   Ht 5' 4.5" (1.638 m)   Wt 259 lb (117.5 kg)   Breastfeeding? Yes   BMI 43.77 kg/m  Body mass index is 43.77 kg/m. Constitutional: Well nourished, well developed female in no acute distress.  Heart: RRR without murmur Lungs: CTAB  Neuro: Grossly intact Psych:  Normal mood and affect.   Extremities: trace edema   Assessment: Normotensive postpartum as she continues on her Norvasc 5 mgm for her CHTN  Plan: She will undergo no change in her medical therapy.  She was amenable to this plan and we will see her back for  her 6 week pp check up.  Farrel Connersolleen Leahna Hewson, CNM [facility], [facility] 06/04/2017  3:04 PM

## 2017-06-29 ENCOUNTER — Other Ambulatory Visit: Payer: Self-pay | Admitting: Certified Nurse Midwife

## 2017-07-10 ENCOUNTER — Encounter: Payer: Self-pay | Admitting: Certified Nurse Midwife

## 2017-07-10 ENCOUNTER — Ambulatory Visit (INDEPENDENT_AMBULATORY_CARE_PROVIDER_SITE_OTHER): Payer: BLUE CROSS/BLUE SHIELD | Admitting: Certified Nurse Midwife

## 2017-07-10 MED ORDER — NORETHINDRONE 0.35 MG PO TABS: 1.0000 | ORAL_TABLET | Freq: Every day | ORAL | 11 refills | Status: DC

## 2017-07-10 NOTE — Progress Notes (Signed)
Postpartum Visit  Chief Complaint:  Chief Complaint  Patient presents with  . Postpartum Care    6 wk pp    History of Present Illness: Debra Bender is a 32 y.o. WF ,G2P2002, who presents for her 6 week postpartum visit.  Date of delivery: 05/21/2017 Type of delivery: Vaginal delivery - Vacuum or forceps assisted  no Episiotomy No.  Laceration: yes, periurethral laceration Pregnancy or labor problems:  Yes, chronic hypertension controlled on Norvasc 5 mgm, obesity with BMI>40 Any problems since the delivery:  no  Newborn Details:  SINGLETON :  1. Baby's name: Zollie Scale. Birth weight: 8#13oz Maternal Details:  Breast Feeding:  yes Post partum depression/anxiety noted:  no Edinburgh Post-Partum Depression Score:  3  Date of last PAP: 10/12/2016   normal   Review of Systems: Review of Systems  Constitutional: Negative for chills, fever and weight loss.  HENT: Negative for congestion, sinus pain and sore throat.   Eyes: Negative for blurred vision and pain.  Respiratory: Negative for hemoptysis, shortness of breath and wheezing.   Cardiovascular: Negative for chest pain, palpitations and leg swelling.  Gastrointestinal: Negative for abdominal pain, blood in stool, diarrhea, heartburn, nausea and vomiting.  Genitourinary: Negative for dysuria, frequency, hematuria and urgency.  Musculoskeletal: Negative for back pain, joint pain and myalgias.  Skin: Negative for itching and rash.  Neurological: Negative for dizziness, tingling and headaches.  Endo/Heme/Allergies: Negative for environmental allergies and polydipsia. Does not bruise/bleed easily.       Negative for hirsutism   Psychiatric/Behavioral: Negative for depression. The patient is not nervous/anxious and does not have insomnia.     Past Medical History:  Past Medical History:  Diagnosis Date  . Anemia 12/2014  . GERD (gastroesophageal reflux disease)   . Hypertension   . Hypothyroidism   . Obesity affecting  pregnancy     Past Surgical History:  Past Surgical History:  Procedure Laterality Date  . FRACTURE SURGERY Right 32 years old    Family History:  Family History  Problem Relation Age of Onset  . Depression Mother   . Heart disease Father   . Asthma Brother   . Emphysema Maternal Grandmother   . Diabetes Maternal Grandfather   . Cirrhosis Paternal Grandmother   . Heart disease Paternal Grandfather     Social History:  Social History   Socioeconomic History  . Marital status: Married    Spouse name: Aurther Loft  . Number of children: 2  . Years of education: Not on file  . Highest education level: Not on file  Occupational History  . Not on file  Social Needs  . Financial resource strain: Not on file  . Food insecurity:    Worry: Not on file    Inability: Not on file  . Transportation needs:    Medical: Not on file    Non-medical: Not on file  Tobacco Use  . Smoking status: Former Smoker    Last attempt to quit: 07/31/2014    Years since quitting: 2.9  . Smokeless tobacco: Never Used  Substance and Sexual Activity  . Alcohol use: No  . Drug use: No  . Sexual activity: Not Currently    Birth control/protection: None  Lifestyle  . Physical activity:    Days per week: Not on file    Minutes per session: Not on file  . Stress: Not on file  Relationships  . Social connections:    Talks on phone: Not on file  Gets together: Not on file    Attends religious service: Not on file    Active member of club or organization: Not on file    Attends meetings of clubs or organizations: Not on file    Relationship status: Not on file  . Intimate partner violence:    Fear of current or ex partner: Not on file    Emotionally abused: Not on file    Physically abused: Not on file    Forced sexual activity: Not on file  Other Topics Concern  . Not on file  Social History Narrative  . Not on file    Allergies:  Allergies  Allergen Reactions  . Hydrochlorothiazide Other  (See Comments)    Elevated creatinine  . Latex Itching and Rash    Medications: Prior to Admission medications   Medication Sig Start Date End Date Taking? Authorizing Provider  Acetaminophen (MAPAP) 500 MG coapsule Take by mouth.    [provider]  amLODipine (NORVASC) 5 MG tablet  05/05/17   [provider]  Butalbital-APAP-Caffeine 50-325-40 MG capsule Take 1-2 capsules by mouth every 6 (six) hours as needed for headache. 03/09/17   Tresea MallGledhill, Jane, CNM  ibuprofen (ADVIL,MOTRIN) 600 MG tablet Take 1 tablet (600 mg total) by mouth every 6 (six) hours as needed. 05/22/17   Farrel ConnersGutierrez, Likisha Alles, CNM  Prenatal Vit-Fe Fumarate-FA (PRENATAL MULTIVITAMIN) TABS tablet Take 1 tablet by mouth daily at 12 noon.    [provider]  ranitidine (ZANTAC) 300 MG tablet Take 1 tablet (300 mg total) by mouth at bedtime. 05/22/17   Farrel ConnersGutierrez, Alyse Kathan, CNM    Physical Exam Vitals: BP 112/72   Pulse 80   Ht 5' 4.5" (1.638 m)   Wt 255 lb (115.7 kg)   Breastfeeding? Yes   BMI 43.09 kg/m  General: WF in NAD HEENT: normocephalic, anicteric Neck: No thyroid enlargement, no palpable nodules, no cervical lymphadenpathy Breast: Lactating, no inflammation, no masses, nipples intact Pulmonary: No increased work of breathing, CTAB Abdomen: Soft, non-tender, non-distended.  Umbilicus without lesions.  No hepatomegaly or masses palpable. No evidence of hernia. Genitourinary:  External: no lesions or inflammation    Vagina: Normal vaginal mucosa, no evidence of prolapse.    Cervix: closed, no bleeding  Uterus: Well involuted, mobile, non-tender, RV  Adnexa: No adnexal masses, non-tender  Rectal: deferred Extremities: no edema, erythema, or tenderness Neurologic: Grossly intact Psychiatric: mood appropriate, affect full  Assessment: 32 y.o. [id number] presenting for 6 week postpartum visit CHTN-normotensive on Norvasc  Plan:  1) Contraception Education: Husband will be getting a  vasectomy.. Patient would like to use the minipill in the interim for contraception. Discussed how to take the minipill, MOA, 3 hour rule.  Rx for Camila sent to pharmacy. 2)  Pap due after July  3) Patient underwent screening for postpartum depression with no concerns noted.  4) Discussed return to normal activity, recommend continuing prenatal vitamins.  5) Follow up 1 year for routine annual exam.  Farrel Connersolleen Krisy Dix, CNM

## 2018-04-04 ENCOUNTER — Other Ambulatory Visit (HOSPITAL_COMMUNITY)
Admission: RE | Admit: 2018-04-04 | Discharge: 2018-04-04 | Disposition: A | Discharge status: Home / Self Care | Payer: BLUE CROSS/BLUE SHIELD | Source: Ambulatory Visit | Attending: Advanced Practice Midwife | Admitting: Advanced Practice Midwife

## 2018-04-04 ENCOUNTER — Encounter: Payer: Self-pay | Admitting: Advanced Practice Midwife

## 2018-04-04 ENCOUNTER — Ambulatory Visit (INDEPENDENT_AMBULATORY_CARE_PROVIDER_SITE_OTHER): Payer: BLUE CROSS/BLUE SHIELD | Admitting: Advanced Practice Midwife

## 2018-04-04 VITALS — BP 130/80 | Wt 272.0 lb

## 2018-04-04 DIAGNOSIS — O99210 Obesity complicating pregnancy, unspecified trimester: Secondary | ICD-10-CM | POA: Insufficient documentation

## 2018-04-04 DIAGNOSIS — O99211 Obesity complicating pregnancy, first trimester: Secondary | ICD-10-CM

## 2018-04-04 DIAGNOSIS — O0990 Supervision of high risk pregnancy, unspecified, unspecified trimester: Secondary | ICD-10-CM | POA: Insufficient documentation

## 2018-04-04 DIAGNOSIS — Z3201 Encounter for pregnancy test, result positive: Secondary | ICD-10-CM | POA: Diagnosis not present

## 2018-04-04 DIAGNOSIS — Z3A08 8 weeks gestation of pregnancy: Secondary | ICD-10-CM | POA: Diagnosis not present

## 2018-04-04 DIAGNOSIS — Z113 Encounter for screening for infections with a predominantly sexual mode of transmission: Secondary | ICD-10-CM

## 2018-04-04 DIAGNOSIS — Z6841 Body Mass Index (BMI) 40.0 and over, adult: Secondary | ICD-10-CM

## 2018-04-04 LAB — POCT URINALYSIS DIPSTICK OB
GLUCOSE, UA: NEGATIVE
POC,PROTEIN,UA: NEGATIVE

## 2018-04-04 LAB — POCT URINE PREGNANCY: PREG TEST UR: POSITIVE — AB

## 2018-04-04 MED ORDER — FOLIC ACID 1 MG PO TABS
1.0000 mg | ORAL_TABLET | Freq: Every day | ORAL | 10 refills | Status: DC
Start: 2018-04-04 — End: 2018-11-23

## 2018-04-04 NOTE — Progress Notes (Signed)
New Obstetric Patient H&P    Chief Complaint: "Desires prenatal care"   History of Present Illness: Patient is a 33 y.o. [id number] Not Hispanic or Latino female, presents with amenorrhea and positive home pregnancy test. Patient's last menstrual period was 02/03/2018. and based on her  LMP, her EDD is Estimated Date of Delivery: 11/10/18 and her EGA is [redacted]w[redacted]d. Cycles are 5-6 days, regular, and occur approximately every : 28 days. Her last pap smear was 1 or 2 years ago and was no abnormalities.    She had a urine pregnancy test which was positive 2 week(s)  ago. Her last menstrual period was normal and lasted for  5 or 6 day(s). Since her LMP she claims she has experienced breast tenderness, fatigue, nausea, vomiting. She denies vaginal bleeding. Her past medical history is contributory for essential hypertension, history of preeclampsia, hx hypothyroid in pregnancy, BMI greater than 40, GERD. Her prior pregnancies are notable for 2016 female FT SVD 6 pounds 12 ounces, 2019 female FT SVD 8 pounds 14 ounces, 20 second shoulder dystocia  Since her LMP, she admits to the use of tobacco products  no She claims she has gained   2 pounds since the start of her pregnancy.  There are cats in the home in the home  no  She admits close contact with children on a regular basis  yes  She has had chicken pox in the past yes She has had Tuberculosis exposures, symptoms, or previously tested positive for TB   no Current or past history of domestic violence. no  Genetic Screening/Teratology Counseling: (Includes patient, baby's father, or anyone in either family with:)   1. Patient's age >/= 33 at [facility]  no 2. Thalassemia ([place], [place], [place], or Asian background): MCV<80  no 3. Neural tube defect (meningomyelocele, spina bifida, anencephaly)  no 4. Congenital heart defect  FOB sister had twins with heart defects  5. Down syndrome  no 6. Tay-Sachs (Jewish, [place]  no 7. Canavan's Disease   no 8. Sickle cell disease or trait (African)  no  9. Hemophilia or other blood disorders  no  10. Muscular dystrophy  no  11. Cystic fibrosis  no  12. Huntington's Chorea  no  13. Mental retardation/autism  no 14. Other inherited genetic or chromosomal disorder  no 15. Maternal metabolic disorder (DM, PKU, etc)  no 16. Patient or FOB with a child with a birth defect not listed above no  16a. Patient or FOB with a birth defect themselves no 17. Recurrent pregnancy loss, or stillbirth  no  18. Any medications since LMP other than prenatal vitamins (include vitamins, supplements, OTC meds, drugs, alcohol)  Norvasc, Zantac 19. Any other genetic/environmental exposure to discuss  no  Infection History:   1. Lives with someone with TB or TB exposed  no  2. Patient or partner has history of genital herpes  no 3. Rash or viral illness since LMP  no 4. History of STI (GC, CT, HPV, syphilis, HIV)  no 5. History of recent travel :  no  Other pertinent information:  no     Review of Systems:10 point review of systems negative unless otherwise noted in HPI  Past Medical History:  Past Medical History:  Diagnosis Date  . Anemia 12/2014  . GERD (gastroesophageal reflux disease)   . Hypertension   . Hypothyroidism   . Obesity affecting pregnancy     Past Surgical History:  Past Surgical History:  Procedure Laterality Date  .  FRACTURE SURGERY Right 33 years old    Gynecologic History: Patient's last menstrual period was 02/03/2018.  Obstetric History: [id number]  Family History:  Family History  Problem Relation Age of Onset  . Depression Mother   . Heart disease Father   . Asthma Brother   . Emphysema Maternal Grandmother   . Diabetes Maternal Grandfather   . Cirrhosis Paternal Grandmother   . Heart disease Paternal Grandfather     Social History:  Social History   Socioeconomic History  . Marital status: Married    Spouse name: Aurther Loft  . Number of children: 2  . Years  of education: Not on file  . Highest education level: Not on file  Occupational History  . Not on file  Social Needs  . Financial resource strain: Not on file  . Food insecurity:    Worry: Not on file    Inability: Not on file  . Transportation needs:    Medical: Not on file    Non-medical: Not on file  Tobacco Use  . Smoking status: Former Smoker    Last attempt to quit: 07/31/2014    Years since quitting: 3.6  . Smokeless tobacco: Never Used  Substance and Sexual Activity  . Alcohol use: No  . Drug use: No  . Sexual activity: Not Currently    Birth control/protection: None  Lifestyle  . Physical activity:    Days per week: Not on file    Minutes per session: Not on file  . Stress: Not on file  Relationships  . Social connections:    Talks on phone: Not on file    Gets together: Not on file    Attends religious service: Not on file    Active member of club or organization: Not on file    Attends meetings of clubs or organizations: Not on file    Relationship status: Not on file  . Intimate partner violence:    Fear of current or ex partner: Not on file    Emotionally abused: Not on file    Physically abused: Not on file    Forced sexual activity: Not on file  Other Topics Concern  . Not on file  Social History Narrative  . Not on file    Allergies:  Allergies  Allergen Reactions  . Hydrochlorothiazide Other (See Comments)    Elevated creatinine  . Latex Itching and Rash    Medications: Prior to Admission medications   Medication Sig Start Date End Date Taking? Authorizing Provider  Acetaminophen (MAPAP) 500 MG coapsule Take by mouth.   Yes [provider]  amLODipine (NORVASC) 5 MG tablet  05/05/17  Yes [provider]  Butalbital-APAP-Caffeine 50-325-40 MG capsule Take 1-2 capsules by mouth every 6 (six) hours as needed for headache. 03/09/17  Yes Tresea Mall, CNM  Prenatal Vit-Fe Fumarate-FA (PRENATAL MULTIVITAMIN) TABS tablet Take 1  tablet by mouth daily at 12 noon.   Yes [provider]  ranitidine (ZANTAC) 300 MG tablet Take 1 tablet (300 mg total) by mouth at bedtime. 05/22/17  Yes Farrel Conners, CNM  folic acid (FOLVITE) 1 MG tablet Take 1 tablet (1 mg total) by mouth daily. 04/04/18   Tresea Mall, CNM    Physical Exam Vitals: Blood pressure 130/80, weight 272 lb (123.4 kg), last menstrual period 02/03/2018  General: NAD HEENT: normocephalic, anicteric Thyroid: no enlargement, no palpable nodules Pulmonary: No increased work of breathing, CTAB Cardiovascular: RRR, distal pulses 2+ Abdomen: NABS, soft, non-tender, non-distended.  Umbilicus  without lesions.  No hepatomegaly, splenomegaly or masses palpable. No evidence of hernia  Genitourinary: deferred for no concerns/PAP interval Extremities: no edema, erythema, or tenderness Neurologic: Grossly intact Psychiatric: mood appropriate, affect full   Assessment: 33 y.o. [id number] at 6184w4d presenting to initiate prenatal care  Plan: 1) Avoid alcoholic beverages. 2) Patient encouraged not to smoke.  3) Discontinue the use of all non-medicinal drugs and chemicals.  4) Take prenatal vitamins daily.  5) Nutrition, food safety (fish, cheese advisories, and high nitrite foods) and exercise discussed. 6) Hospital and practice style discussed with cross coverage system.  7) Genetic Screening, such as with 1st Trimester Screening, cell free fetal DNA, AFP testing, and Ultrasound, as well as with amniocentesis and CVS as appropriate, is discussed with patient. At the conclusion of today's visit patient requested cell free DNA genetic testing 8) Patient is asked about travel to areas at risk for the Zika virus, and counseled to avoid travel and exposure to mosquitoes or sexual partners who may have themselves been exposed to the virus. Testing is discussed, and will be ordered as appropriate.  9) Urine labs/aptima today 10) Return to clinic in 1 week for dating  scan 11) Early 1 hr gtt, MaterniT21, Inheritest, NOB panel, Baseline preeclampsia labs, TSH at 10+ week visit   Tresea MallJane Dutch Ing, CNM [facility], [facility] 04/04/2018, 10:54 AM

## 2018-04-04 NOTE — Patient Instructions (Signed)
Exercise During Pregnancy For people of all ages, exercise is an important part of being healthy. Exercise improves heart and lung function and helps to maintain strength, flexibility, and a healthy body weight. Exercise also boosts energy levels and elevates mood. For most women, maintaining an exercise routine throughout pregnancy is recommended. It is only on rare occasions and with certain medical conditions or pregnancy complications that women may be asked to limit or avoid exercise during pregnancy. What are some other benefits to exercising during pregnancy? Along with maintaining strength and flexibility, exercising throughout pregnancy can help to:  Keep strength in muscles that are very important during labor and childbirth.  Decrease low back pain during pregnancy.  Decrease the risk of developing gestational diabetes mellitus (GDM).  Improve blood sugar (glucose) control for women who have GDM.  Decrease the risk of developing preeclampsia. This is a serious condition that causes high blood pressure along with other symptoms, such as swelling and headaches.  Decrease the risk of cesarean delivery.  Speed up the recovery after giving birth. How often should I exercise? Unless your health care provider gives you different instructions, you should try to exercise on most days or all days of the week. In general, try to exercise with moderate intensity for about 150 minutes per week. This can be spread out across several days, such as exercising for 30 minutes per day on 5 days of each week. You can tell that you are exercising at a moderate intensity if you have a higher heart rate and faster breathing, but you are still able to hold a conversation. What types of moderate-intensity exercise are recommended during pregnancy? There are many types of exercise that are safe for you to do during pregnancy. Unless your health care provider gives you different instructions, do a variety of  exercises that safely increase your heart and breathing (cardiopulmonary) rates and help you to build and maintain muscle strength (strength training). You should always be able to talk in full sentences while exercising during pregnancy. Some examples of exercising that is safe to do during pregnancy include:  Brisk walking or hiking.  Swimming.  Water aerobics.  Riding a stationary bike.  Strength training.  Modified yoga or Pilates. Tell your instructor that you are pregnant. Avoid overstretching and avoid lying on your back for long periods of time.  Running or jogging. Only choose this type of exercise if: ? You ran or jogged regularly before your pregnancy. ? You can run or jog and still talk in complete sentences. What types of exercise should I not do during pregnancy? Depending on your level of fitness and whether you exercised regularly before your pregnancy, you may be advised to limit vigorous-intensity exercise during your pregnancy. You can tell that you are exercising at a vigorous intensity if you are breathing much harder and faster and cannot hold a conversation while exercising. Some examples of exercising that you should avoid during pregnancy include:  Contact sports.  Activities that place you at risk for falling on or being hit in the belly, such as downhill skiing, water skiing, surfing, rock climbing, cycling, gymnastics, and horseback riding.  Scuba diving.  Sky diving.  Yoga or Pilates in a room that is heated to extreme temperatures ("hot yoga" or "hot Pilates").  Jogging or running, unless you ran or jogged regularly before your pregnancy. While jogging or running, you should always be able to talk in full sentences. Do not run or jog so vigorously that you   are unable to have a conversation.  If you are not used to exercising at elevation (more than 6,000 feet above sea level), do not do so during your pregnancy. When should I avoid exercising during  pregnancy? Certain medical conditions can make it unsafe to exercise during pregnancy, or they may increase your risk of miscarriage or early labor and birth. Some of these conditions include:  Some types of heart disease.  Some types of lung disease.  Placenta previa. This is when the placenta partially or completely covers the opening of the uterus (cervix).  Frequent bleeding from the vagina during your pregnancy.  Incompetent cervix. This is when your cervix does not remain as tightly closed during pregnancy as it should.  Premature labor.  Ruptured membranes. This is when the protective sac (amniotic sac) opens up and amniotic fluid leaks from your vagina.  Severely low blood count (anemia).  Preeclampsia or pregnancy-caused high blood pressure.  Carrying more than one baby (multiple gestation) and having an additional risk of early labor.  Poorly controlled diabetes.  Being severely underweight or severely overweight.  Intrauterine growth restriction. This is when your baby's growth and development during pregnancy are slower than expected.  Other medical conditions. Ask your health care provider if any apply to you. What else should I know about exercising during pregnancy? You should take these precautions while exercising during pregnancy:  Avoid overheating. ? Wear loose-fitting, breathable clothes. ? Do not exercise in very high temperatures.  Avoid dehydration. Drink enough water before, during, and after exercise to keep your urine clear or pale yellow.  Avoid overstretching. Because of hormone changes during pregnancy, it is easy to overstretch muscles, tendons, and ligaments during pregnancy.  Start slowly and ask your health care provider to recommend types of exercise that are safe for you, if exercising regularly is new for you. Pregnancy is not a time for exercising to lose weight. When should I seek medical care? You should stop exercising and call your  health care provider if you have any unusual symptoms, such as:  Mild uterine contractions or abdominal cramping.  Dizziness that does not improve with rest. When should I seek immediate medical care? You should stop exercising and call your local emergency services (911 in the [place] if you have any unusual symptoms, such as:  Sudden, severe pain in your low back or your belly.  Uterine contractions or abdominal cramping that do not improve with rest.  Chest pain.  Bleeding or fluid leaking from your vagina.  Shortness of breath. This information is not intended to replace advice given to you by your health care provider. Make sure you discuss any questions you have with your health care provider. Document Released: 03/20/2005 Document Revised: 08/18/2015 Document Reviewed: 05/28/2014 Elsevier Interactive Patient Education  2019 [employer] Eating Plan for Pregnant Women While you are pregnant, your body requires additional nutrition to help support your growing baby. You also have a higher need for some vitamins and minerals, such as folic acid, calcium, iron, and vitamin D. Eating a healthy, well-balanced diet is very important for your health and your baby's health. Your need for extra calories varies for the three 3-month segments of your pregnancy (trimesters). For most women, it is recommended to consume:  150 extra calories a day during the first trimester.  300 extra calories a day during the second trimester.  300 extra calories a day during the third trimester. What are tips for following this plan?   Do   not try to lose weight or go on a diet during pregnancy.  Limit your overall intake of foods that have "empty calories." These are foods that have little nutritional value, such as sweets, desserts, candies, and sugar-sweetened beverages.  Eat a variety of foods (especially fruits and vegetables) to get a full range of vitamins and minerals.  Take a prenatal vitamin  to help meet your additional vitamin and mineral needs during pregnancy, specifically for folic acid, iron, calcium, and vitamin D.  Remember to stay active. Ask your health care provider what types of exercise and activities are safe for you.  Practice good food safety and cleanliness. Wash your hands before you eat and after you prepare raw meat. Wash all fruits and vegetables well before peeling or eating. Taking these actions can help to prevent food-borne illnesses that can be very dangerous to your baby, such as listeriosis. Ask your health care provider for more information about listeriosis. What does 150 extra calories look like? Healthy options that provide 150 extra calories each day could be any of the following:  6-8 oz (170-230 g) of plain low-fat yogurt with  cup of berries.  1 apple with 2 teaspoons (11 g) of peanut butter.  Cut-up vegetables with  cup (60 g) of hummus.  8 oz (230 mL) or 1 cup of low-fat chocolate milk.  1 stick of string cheese with 1 medium orange.  1 peanut butter and jelly sandwich that is made with one slice of whole-wheat bread and 1 tsp (5 g) of peanut butter. For 300 extra calories, you could eat two of those healthy options each day. What is a healthy amount of weight to gain? The right amount of weight gain for you is based on your BMI before you became pregnant. If your BMI:  Was less than 18 (underweight), you should gain 28-40 lb (13-18 kg).  Was 18-24.9 (normal), you should gain 25-35 lb (11-16 kg).  Was 25-29.9 (overweight), you should gain 15-25 lb (7-11 kg).  Was 30 or greater (obese), you should gain 11-20 lb (5-9 kg). What if I am having twins or multiples? Generally, if you are carrying twins or multiples:  You may need to eat 300-600 extra calories a day.  The recommended range for total weight gain is 25-54 lb (11-25 kg), depending on your BMI before pregnancy.  Talk with your health care provider to find out about  nutritional needs, weight gain, and exercise that is right for you. What foods can I eat?  Grains All grains. Choose whole grains, such as whole-wheat bread, oatmeal, or brown rice. Vegetables All vegetables. Eat a variety of colors and types of vegetables. Remember to wash your vegetables well before peeling or eating. Fruits All fruits. Eat a variety of colors and types of fruit. Remember to wash your fruits well before peeling or eating. Meats and other protein foods Lean meats, including chicken, turkey, fish, and lean cuts of beef, veal, or pork. If you eat fish or seafood, choose options that are higher in omega-3 fatty acids and lower in mercury, such as salmon, herring, mussels, trout, sardines, pollock, shrimp, crab, and lobster. Tofu. Tempeh. Beans. Eggs. Peanut butter and other nut butters. Make sure that all meats, poultry, and eggs are cooked to food-safe temperatures or "well-done." Two or more servings of fish are recommended each week in order to get the most benefits from omega-3 fatty acids that are found in seafood. Choose fish that are lower in mercury. You can   find more information online:  [employer] Dairy Pasteurized milk and milk alternatives (such as almond milk). Pasteurized yogurt and pasteurized cheese. Cottage cheese. Sour cream. Beverages Water. Juices that contain 100% fruit juice or vegetable juice. Caffeine-free teas and decaffeinated coffee. Drinks that contain caffeine are okay to drink, but it is better to avoid caffeine. Keep your total caffeine intake to less than 200 mg each day (which is 12 oz or 355 mL of coffee, tea, or soda) or the limit as told by your health care provider. Fats and oils Fats and oils are okay to include in moderation. Sweets and desserts Sweets and desserts are okay to include in moderation. Seasoning and other foods All pasteurized condiments. The items listed above may not be a complete list of recommended foods and beverages.  Contact your dietitian for more options. What foods are not recommended? Vegetables Raw (unpasteurized) vegetable juices. Fruits Unpasteurized fruit juices. Meats and other protein foods Lunch meats, bologna, hot dogs, or other deli meats. (If you must eat those meats, reheat them until they are steaming hot.) Refrigerated pat, meat spreads from a meat counter, smoked seafood that is found in the refrigerated section of a store. Raw or undercooked meats, poultry, and eggs. Raw fish, such as sushi or sashimi. Fish that have high mercury content, such as tilefish, shark, swordfish, and king mackerel. To learn more about mercury in fish, talk with your health care provider or look for online resources, such as:  [employer] Dairy Raw (unpasteurized) milk and any foods that have raw milk in them. Soft cheeses, such as feta, queso blanco, queso fresco, Brie, Camembert cheeses, blue-veined cheeses, and Panela cheese (unless it is made with pasteurized milk, which must be stated on the label). Beverages Alcohol. Sugar-sweetened beverages, such as sodas, teas, or energy drinks. Seasoning and other foods Homemade fermented foods and drinks, such as pickles, sauerkraut, or kombucha drinks. (Store-bought pasteurized versions of these are okay.) Salads that are made in a store or deli, such as ham salad, chicken salad, egg salad, tuna salad, and seafood salad. The items listed above may not be a complete list of foods and beverages to avoid. Contact your dietitian for more information. Where to find more information To calculate the number of calories you need based on your height, weight, and activity level, you can use an online calculator such as:  MobileTransition.ch To calculate how much weight you should gain during pregnancy, you can use an online pregnancy weight gain calculator such as:  StreamingFood.com.cy Summary  While you are pregnant,  your body requires additional nutrition to help support your growing baby.  Eat a variety of foods, especially fruits and vegetables to get a full range of vitamins and minerals.  Practice good food safety and cleanliness. Wash your hands before you eat and after you prepare raw meat. Wash all fruits and vegetables well before peeling or eating. Taking these actions can help to prevent food-borne illnesses, such as listeriosis, that can be very dangerous to your baby.  Do not eat raw meat or fish. Do not eat fish that have high mercury content, such as tilefish, shark, swordfish, and king mackerel. Do not eat unpasteurized (raw) dairy.  Take a prenatal vitamin to help meet your additional vitamin and mineral needs during pregnancy, specifically for folic acid, iron, calcium, and vitamin D. This information is not intended to replace advice given to you by your health care provider. Make sure you discuss any questions you have with your health care  provider. Document Released: 01/02/2014 Document Revised: 12/15/2016 Document Reviewed: 12/15/2016 Elsevier Interactive Patient Education  2019 Elsevier Inc. Prenatal Care Prenatal care is health care during pregnancy. It helps you and your unborn baby (fetus) stay as healthy as possible. Prenatal care may be provided by a midwife, a family practice health care provider, or a childbirth and pregnancy specialist (obstetrician). How does this affect me? During pregnancy, you will be closely monitored for any new conditions that might develop. To lower your risk of pregnancy complications, you and your health care provider will talk about any underlying conditions you have. How does this affect my baby? Early and consistent prenatal care increases the chance that your baby will be healthy during pregnancy. Prenatal care lowers the risk that your baby will be:  Born early (prematurely).  Smaller than expected at birth (small for gestational age). What  can I expect at the first prenatal care visit? Your first prenatal care visit will likely be the longest. You should schedule your first prenatal care visit as soon as you know that you are pregnant. Your first visit is a good time to talk about any questions or concerns you have about pregnancy. At your visit, you and your health care provider will talk about:  Your medical history, including: ? Any past pregnancies. ? Your family's medical history. ? The baby's father's medical history. ? Any long-term (chronic) health conditions you have and how you manage them. ? Any surgeries or procedures you have had. ? Any current over-the-counter or prescription medicines, herbs, or supplements you are taking.  Other factors that could pose a risk to your baby, including:  Your home setting and your stress levels, including: ? Exposure to abuse or violence. ? Household financial strain. ? Mental health conditions you have.  Your daily health habits, including diet and exercise. Your health care provider will also:  Measure your weight, height, and blood pressure.  Do a physical exam, including a pelvic and breast exam.  Perform blood tests and urine tests to check for: ? Urinary tract infection. ? Sexually transmitted infections (STIs). ? Low iron levels in your blood (anemia). ? Blood type and certain proteins on red blood cells (Rh antibodies). ? Infections and immunity to viruses, such as hepatitis B and rubella. ? HIV (human immunodeficiency virus).  Do an ultrasound to confirm your baby's growth and development and to help predict your estimated due date (EDD). This ultrasound is done with a probe that is inserted into the vagina (transvaginal ultrasound).  Discuss your options for genetic screening.  Give you information about how to keep yourself and your baby healthy, including: ? Nutrition and taking vitamins. ? Physical activity. ? How to manage pregnancy symptoms such as  nausea and vomiting (morning sickness). ? Infections and substances that may be harmful to your baby and how to avoid them. ? Food safety. ? Dental care. ? Working. ? Travel. ? Warning signs to watch for and when to call your health care provider. How often will I have prenatal care visits? After your first prenatal care visit, you will have regular visits throughout your pregnancy. The visit schedule is often as follows:  Up to week 28 of pregnancy: once every 4 weeks.  28-36 weeks: once every 2 weeks.  After 36 weeks: every week until delivery. Some women may have visits more or less often depending on any underlying health conditions and the health of the baby. Keep all follow-up and prenatal care visits as told by   your health care provider. This is important. What happens during routine prenatal care visits? Your health care provider will:  Measure your weight and blood pressure.  Check for fetal heart sounds.  Measure the height of your uterus in your abdomen (fundal height). This may be measured starting around week 20 of pregnancy.  Check the position of your baby inside your uterus.  Ask questions about your diet, sleeping patterns, and whether you can feel the baby move.  Review warning signs to watch for and signs of labor.  Ask about any pregnancy symptoms you are having and how you are dealing with them. Symptoms may include: ? Headaches. ? Nausea and vomiting. ? Vaginal discharge. ? Swelling. ? Fatigue. ? Constipation. ? Any discomfort, including back or pelvic pain. Make a list of questions to ask your health care provider at your routine visits. What tests might I have during prenatal care visits? You may have blood, urine, and imaging tests throughout your pregnancy, such as:  Urine tests to check for glucose, protein, or signs of infection.  Glucose tests to check for a form of diabetes that can develop during pregnancy (gestational diabetes mellitus).  This is usually done around week 24 of pregnancy.  An ultrasound to check your baby's growth and development and to check for birth defects. This is usually done around week 20 of pregnancy.  A test to check for group B strep (GBS) infection. This is usually done around week 36 of pregnancy.  Genetic testing. This may include blood or imaging tests, such as an ultrasound. Some genetic tests are done during the first trimester and some are done during the second trimester. What else can I expect during prenatal care visits? Your health care provider may recommend getting certain vaccines during pregnancy. These may include:  A yearly flu shot (annual influenza vaccine). This is especially important if you will be pregnant during flu season.  Tdap (tetanus, diphtheria, pertussis) vaccine. Getting this vaccine during pregnancy can protect your baby from whooping cough (pertussis) after birth. This vaccine may be recommended between weeks 27 and 36 of pregnancy. Later in your pregnancy, your health care provider may give you information about:  Childbirth and breastfeeding classes.  Choosing a health care provider for your baby.  Umbilical cord banking.  Breastfeeding.  Birth control after your baby is born.  The hospital labor and delivery unit and how to tour it.  Registering at the hospital before you go into labor. Where to find more information  Office on Women's Health: TravelLesson.ca  American Pregnancy Association: americanpregnancy.org  March of Dimes: marchofdimes.org Summary  Prenatal care helps you and your baby stay as healthy as possible during pregnancy.  Your first prenatal care visit will most likely be the longest.  You will have visits and tests throughout your pregnancy to monitor your health and your baby's health.  Bring a list of questions to your visits to ask your health care provider.  Make sure to keep all follow-up and prenatal care visits with  your health care provider. This information is not intended to replace advice given to you by your health care provider. Make sure you discuss any questions you have with your health care provider. Document Released: 03/23/2003 Document Revised: 03/19/2017 Document Reviewed: 03/19/2017 Elsevier Interactive Patient Education  2019 Elsevier Inc. Hypertension During Pregnancy  Hypertension is also called high blood pressure. High blood pressure means that the force of your blood moving in your body is too strong. When you  are pregnant, this condition should be watched carefully. It can cause problems for you and your baby. Follow these instructions at home: Eating and drinking   Drink enough fluid to keep your pee (urine) pale yellow.  Avoid caffeine. Lifestyle  Do not use any products that contain nicotine or tobacco, such as cigarettes and e-cigarettes. If you need help quitting, ask your doctor.  Do not use alcohol or drugs.  Avoid stress.  Rest and get plenty of sleep. General instructions  Take over-the-counter and prescription medicines only as told by your doctor.  While lying down, lie on your left side. This keeps pressure off your major blood vessels.  While sitting or lying down, raise (elevate) your feet. Try putting some pillows under your lower legs.  Exercise regularly. Ask your doctor what kinds of exercise are best for you.  Keep all prenatal and follow-up visits as told by your doctor. This is important. Contact a doctor if:  You have symptoms that your doctor told you to watch for, such as: ? Throwing up (vomiting). ? Feeling sick to your stomach (nausea). ? Headache. Get help right away if you have:  Very bad belly pain that does not get better with treatment.  A very bad headache that does not get better.  Throwing up that does not get better with treatment.  Sudden, fast weight gain.  Sudden swelling in your hands, ankles, or face.  Bleeding  from your vagina.  Blood in your pee.  Fewer movements from your baby than usual.  Blurry vision.  Double vision.  Muscle twitching.  Sudden muscle tightening (spasms).  Trouble breathing.  Blue fingernails or lips. Summary  Hypertension is also called high blood pressure. High blood pressure means that the force of your blood moving in your body is too strong.  When you are pregnant, this condition should be watched carefully. It can cause problems for you and your baby.  Get help right away if you have symptoms that your doctor told you to watch for. This information is not intended to replace advice given to you by your health care provider. Make sure you discuss any questions you have with your health care provider. Document Released: 04/22/2010 Document Revised: 03/06/2017 Document Reviewed: 11/30/2015 Elsevier Interactive Patient Education  2019 [employer]

## 2018-04-05 LAB — CERVICOVAGINAL ANCILLARY ONLY
Chlamydia: NEGATIVE
Neisseria Gonorrhea: NEGATIVE
Trichomonas: NEGATIVE

## 2018-04-06 LAB — URINE CULTURE

## 2018-04-16 ENCOUNTER — Encounter: Payer: Self-pay | Admitting: Obstetrics and Gynecology

## 2018-04-16 ENCOUNTER — Other Ambulatory Visit: Payer: Self-pay | Admitting: Advanced Practice Midwife

## 2018-04-16 ENCOUNTER — Ambulatory Visit (INDEPENDENT_AMBULATORY_CARE_PROVIDER_SITE_OTHER): Payer: BLUE CROSS/BLUE SHIELD | Admitting: Obstetrics and Gynecology

## 2018-04-16 ENCOUNTER — Ambulatory Visit (INDEPENDENT_AMBULATORY_CARE_PROVIDER_SITE_OTHER): Payer: BLUE CROSS/BLUE SHIELD

## 2018-04-16 VITALS — BP 110/70 | Wt 270.0 lb

## 2018-04-16 DIAGNOSIS — O0991 Supervision of high risk pregnancy, unspecified, first trimester: Secondary | ICD-10-CM

## 2018-04-16 DIAGNOSIS — O0990 Supervision of high risk pregnancy, unspecified, unspecified trimester: Secondary | ICD-10-CM

## 2018-04-16 DIAGNOSIS — O99210 Obesity complicating pregnancy, unspecified trimester: Secondary | ICD-10-CM

## 2018-04-16 DIAGNOSIS — E038 Other specified hypothyroidism: Secondary | ICD-10-CM

## 2018-04-16 DIAGNOSIS — O99211 Obesity complicating pregnancy, first trimester: Secondary | ICD-10-CM

## 2018-04-16 DIAGNOSIS — Z3A01 Less than 8 weeks gestation of pregnancy: Secondary | ICD-10-CM

## 2018-04-16 NOTE — Progress Notes (Signed)
ROB/US C/o nausea and vomiting 

## 2018-04-16 NOTE — Progress Notes (Signed)
    Routine Prenatal Care Visit  Subjective  Debra Bender is a 33 y.o. G3P2002 at [redacted]w[redacted]d being seen today for ongoing prenatal care.  She is currently monitored for the following issues for this high-risk pregnancy and has Hypothyroidism; History of thyroid disorder; Gastroesophageal reflux disease without esophagitis; Essential hypertension; Supervision of high risk pregnancy, antepartum; Family history of congenital heart disease; BMI 45.0-49.9, adult (HCC); and Obesity affecting pregnancy, antepartum on their problem list.  ----------------------------------------------------------------------------------- Patient reports no complaints.   Contractions: Not present. Vag. Bleeding: None.  Movement: Absent. Denies leaking of fluid.  ----------------------------------------------------------------------------------- The following portions of the patient's history were reviewed and updated as appropriate: allergies, current medications, past family history, past medical history, past social history, past surgical history and problem list. Problem list updated.   Objective  Blood pressure 110/70, weight 270 lb (122.5 kg), last menstrual period 02/03/2018, currently breastfeeding. Pregravid weight 270 lb (122.5 kg) Total Weight Gain 0 lb (0 kg) Urinalysis:      Fetal Status: Fetal Heart Rate (bpm): 160   Movement: Absent     General:  Alert, oriented and cooperative. Patient is in no acute distress.  Skin: Skin is warm and dry. No rash noted.   Cardiovascular: Normal heart rate noted  Respiratory: Normal respiratory effort, no problems with respiration noted  Abdomen: Soft, gravid, appropriate for gestational age. Pain/Pressure: Absent     Pelvic:  Cervical exam deferred        Extremities: Normal range of motion.     Mental Status: Normal mood and affect. Normal behavior. Normal judgment and thought content.     Assessment   33 y.o. [id number] at [redacted]w[redacted]d by  11/27/2018, by Ultrasound  presenting for routine prenatal visit  Plan   pregnancy 3 Problems (from 02/03/18 to present)    No problems associated with this episode.       Gestational age appropriate obstetric precautions including but not limited to vaginal bleeding, contractions, leaking of fluid and fetal movement were reviewed in detail with the patient.    1GTT next visit. Dating [place] today, EDC adjusted Patient does not want to switch off of he amlodipine to labetalol or procardia. She will start ASA at 12 weeks.  Discussed weight gain goal of 0-15lbs. Will need anesthesia referral.  Desires genetic screening at next visit.   Return in about 3 weeks (around 05/07/2018) for ROB, 1GTT.  Natale Milch MD [facility], [facility] 04/16/2018, 10:20 AM

## 2018-04-17 LAB — RPR+RH+ABO+RUB AB+AB SCR+CB...
Antibody Screen: NEGATIVE
HIV Screen 4th Generation wRfx: NONREACTIVE
Hematocrit: 36.9 % (ref 34.0–46.6)
Hemoglobin: 12.3 g/dL (ref 11.1–15.9)
Hepatitis B Surface Ag: NEGATIVE
MCH: 27.2 pg (ref 26.6–33.0)
MCHC: 33.3 g/dL (ref 31.5–35.7)
MCV: 82 fL (ref 79–97)
PLATELETS: 304 10*3/uL (ref 150–450)
RBC: 4.53 x10E6/uL (ref 3.77–5.28)
RDW: 14.1 % (ref 11.7–15.4)
RH TYPE: POSITIVE
RPR Ser Ql: NONREACTIVE
Rubella Antibodies, IGG: 1.3 index (ref >0.99)
VARICELLA: 990 {index} (ref >165)
WBC: 10.7 10*3/uL (ref 3.4–10.8)

## 2018-04-17 LAB — COMPREHENSIVE METABOLIC PANEL
A/G RATIO: 1.5 (ref 1.2–2.2)
ALT: 11 IU/L (ref 0–32)
AST: 11 IU/L (ref 0–40)
Albumin: 4.4 g/dL (ref 3.5–5.5)
Alkaline Phosphatase: 65 IU/L (ref 39–117)
BUN/Creatinine Ratio: 8 — ABNORMAL LOW (ref 9–23)
BUN: 6 mg/dL (ref 6–20)
Bilirubin Total: 0.5 mg/dL (ref 0.0–1.2)
CHLORIDE: 101 mmol/L (ref 96–106)
CO2: 21 mmol/L (ref 20–29)
Calcium: 9.2 mg/dL (ref 8.7–10.2)
Creatinine, Ser: 0.76 mg/dL (ref 0.57–1.00)
GFR calc Af Amer: 121 mL/min/{1.73_m2} (ref >59)
GFR, EST NON AFRICAN AMERICAN: 105 mL/min/{1.73_m2} (ref >59)
GLUCOSE: 84 mg/dL (ref 65–99)
Globulin, Total: 3 g/dL (ref 1.5–4.5)
POTASSIUM: 4.5 mmol/L (ref 3.5–5.2)
Sodium: 137 mmol/L (ref 134–144)
Total Protein: 7.4 g/dL (ref 6.0–8.5)

## 2018-04-17 LAB — TSH+FREE T4
FREE T4: 1.18 ng/dL (ref 0.82–1.77)
TSH: 3.32 u[IU]/mL (ref 0.450–4.500)

## 2018-04-24 NOTE — Progress Notes (Signed)
Normal, released to mychart

## 2018-05-03 ENCOUNTER — Telehealth: Payer: Self-pay

## 2018-05-03 NOTE — Telephone Encounter (Signed)
Pt is 10w; wants to know what she can take for sinus congestion/pressure.  [phone]  No other sxs.  Adv plain sudafed, e.s. tylenol two q4-6hrs while awake, sip hot beverages/soup, push fluids and rest.

## 2018-05-07 ENCOUNTER — Other Ambulatory Visit: Payer: BLUE CROSS/BLUE SHIELD

## 2018-05-07 ENCOUNTER — Ambulatory Visit (INDEPENDENT_AMBULATORY_CARE_PROVIDER_SITE_OTHER): Payer: BLUE CROSS/BLUE SHIELD | Admitting: Obstetrics & Gynecology

## 2018-05-07 VITALS — BP 110/70 | Wt 268.0 lb

## 2018-05-07 DIAGNOSIS — O99210 Obesity complicating pregnancy, unspecified trimester: Secondary | ICD-10-CM

## 2018-05-07 DIAGNOSIS — Z3A10 10 weeks gestation of pregnancy: Secondary | ICD-10-CM

## 2018-05-07 DIAGNOSIS — O99211 Obesity complicating pregnancy, first trimester: Secondary | ICD-10-CM

## 2018-05-07 DIAGNOSIS — O0990 Supervision of high risk pregnancy, unspecified, unspecified trimester: Secondary | ICD-10-CM

## 2018-05-07 DIAGNOSIS — O09891 Supervision of other high risk pregnancies, first trimester: Secondary | ICD-10-CM

## 2018-05-07 LAB — POCT URINALYSIS DIPSTICK OB
GLUCOSE, UA: NEGATIVE
POC,PROTEIN,UA: NEGATIVE

## 2018-05-07 NOTE — Addendum Note (Signed)
Addended by: Cornelius Moras D on: 05/07/2018 09:59 AM   Modules accepted: Orders

## 2018-05-07 NOTE — Patient Instructions (Signed)

## 2018-05-07 NOTE — Progress Notes (Signed)
  Subjective  Mild nausea No pain or bleeding  Objective  BP 110/70   Wt 268 lb (121.6 kg)   LMP 02/03/2018   BMI 45.29 kg/m  General: NAD Pumonary: no increased work of breathing Abdomen: gravid, non-tender Extremities: no edema Psychiatric: mood appropriate, affect full  Assessment  33 y.o. [id number] at [redacted]w[redacted]d by  11/27/2018, by Ultrasound presenting for routine prenatal visit  Plan   Problem List Items Addressed This Visit      Other   Supervision of high risk pregnancy, antepartum - Primary   Relevant Orders   MaterniT21 PLUS Core+SCA   Obesity affecting pregnancy, antepartum    Other Visit Diagnoses    [redacted] weeks gestation of pregnancy        Glc and cfDNA today Labs discussed Nausea, no tx desired Monitor BPs  Annamarie Major, MD, [facility], [facility] 05/07/2018  9:56 AM

## 2018-05-08 LAB — GLUCOSE TOLERANCE, 1 HOUR: Glucose, 1Hr PP: 98 mg/dL (ref 65–199)

## 2018-05-09 NOTE — Progress Notes (Signed)
Negative, Released to mychart 

## 2018-05-12 LAB — MATERNIT21 PLUS CORE+SCA
Chromosome 13: NEGATIVE
Chromosome 18: NEGATIVE
Chromosome 21: NEGATIVE
Y Chromosome: DETECTED

## 2018-05-13 NOTE — Progress Notes (Signed)
Lab results normal for genetic screening (OB), LM

## 2018-06-04 ENCOUNTER — Ambulatory Visit (INDEPENDENT_AMBULATORY_CARE_PROVIDER_SITE_OTHER): Payer: BLUE CROSS/BLUE SHIELD | Admitting: Maternal Newborn

## 2018-06-04 VITALS — BP 120/70 | Wt 268.0 lb

## 2018-06-04 DIAGNOSIS — Z3A14 14 weeks gestation of pregnancy: Secondary | ICD-10-CM

## 2018-06-04 DIAGNOSIS — Z3689 Encounter for other specified antenatal screening: Secondary | ICD-10-CM

## 2018-06-04 DIAGNOSIS — O26892 Other specified pregnancy related conditions, second trimester: Secondary | ICD-10-CM

## 2018-06-04 DIAGNOSIS — R519 Headache, unspecified: Secondary | ICD-10-CM

## 2018-06-04 DIAGNOSIS — O26899 Other specified pregnancy related conditions, unspecified trimester: Secondary | ICD-10-CM

## 2018-06-04 DIAGNOSIS — O0990 Supervision of high risk pregnancy, unspecified, unspecified trimester: Secondary | ICD-10-CM

## 2018-06-04 DIAGNOSIS — R51 Headache: Secondary | ICD-10-CM

## 2018-06-04 MED ORDER — PROCHLORPERAZINE MALEATE 10 MG PO TABS: 10.0000 mg | ORAL_TABLET | Freq: Four times a day (QID) | ORAL | 0 refills | Status: DC | PRN

## 2018-06-04 NOTE — Progress Notes (Signed)
    Routine Prenatal Care Visit  Subjective  Debra Bender is a 33 y.o. G3P2002 at [redacted]w[redacted]d being seen today for ongoing prenatal care.  She is currently monitored for the following issues for this high-risk pregnancy and has Hypothyroidism; History of thyroid disorder; Gastroesophageal reflux disease without esophagitis; Essential hypertension; Supervision of high risk pregnancy, antepartum; Family history of congenital heart disease; BMI 45.0-49.9, adult (HCC); and Obesity affecting pregnancy, antepartum on their problem list.  ----------------------------------------------------------------------------------- Patient reports headache.  Not relieved by medication. Associated with sinus pressure. Sometimes has nausea/vomiting with the headache. Contractions: Not present. Vag. Bleeding: None.  Movement: Present. No leaking of fluid.  ----------------------------------------------------------------------------------- The following portions of the patient's history were reviewed and updated as appropriate: allergies, current medications, past family history, past medical history, past social history, past surgical history and problem list. Problem list updated.  Objective  Blood pressure 120/70, weight 268 lb (121.6 kg), last menstrual period 02/03/2018, currently breastfeeding. Pregravid weight 270 lb (122.5 kg) Total Weight Gain -2 lb (-0.907 kg) Body mass index is 45.29 kg/m.   Fetal Status: Fetal Heart Rate (bpm): 145   Movement: Present     General:  Alert, oriented and cooperative. Patient is in no acute distress.  Skin: Skin is warm and dry. No rash noted.   Cardiovascular: Normal heart rate noted  Respiratory: Normal respiratory effort, no problems with respiration noted  Abdomen: Soft, gravid, appropriate for gestational age. Pain/Pressure: Present     Pelvic:  Cervical exam deferred        Extremities: Normal range of motion.     Mental Status: Normal mood and affect. Normal  behavior. Normal judgment and thought content.     Assessment   33 y.o. H4T6546 at [redacted]w[redacted]d, EDD 11/27/2018 by Ultrasound presenting for a routine prenatal visit.  Plan   pregnancy 3 Problems (from 02/03/18 to present)    No problems associated with this episode.      Rx for compazine to see if that will help with headaches and associated nausea.  Please refer to After Visit Summary for other counseling recommendations.   Return in about 4 weeks (around 07/02/2018) for ROB and anatomy scan.  Marcelyn Bruins, CNM 06/04/2018

## 2018-06-04 NOTE — Patient Instructions (Signed)

## 2018-06-04 NOTE — Progress Notes (Signed)
ROB C/o frequent headaches, some pelvic pressure and congestion

## 2018-07-04 ENCOUNTER — Other Ambulatory Visit: Payer: Self-pay

## 2018-07-04 ENCOUNTER — Ambulatory Visit (INDEPENDENT_AMBULATORY_CARE_PROVIDER_SITE_OTHER): Payer: BLUE CROSS/BLUE SHIELD

## 2018-07-04 ENCOUNTER — Ambulatory Visit (INDEPENDENT_AMBULATORY_CARE_PROVIDER_SITE_OTHER): Payer: BLUE CROSS/BLUE SHIELD | Admitting: Obstetrics and Gynecology

## 2018-07-04 ENCOUNTER — Encounter: Payer: Self-pay | Admitting: Obstetrics and Gynecology

## 2018-07-04 VITALS — BP 124/84 | Wt 272.0 lb

## 2018-07-04 DIAGNOSIS — Z3689 Encounter for other specified antenatal screening: Secondary | ICD-10-CM

## 2018-07-04 DIAGNOSIS — Z363 Encounter for antenatal screening for malformations: Secondary | ICD-10-CM | POA: Diagnosis not present

## 2018-07-04 DIAGNOSIS — Z8279 Family history of other congenital malformations, deformations and chromosomal abnormalities: Secondary | ICD-10-CM

## 2018-07-04 DIAGNOSIS — O99210 Obesity complicating pregnancy, unspecified trimester: Secondary | ICD-10-CM

## 2018-07-04 DIAGNOSIS — Z3A19 19 weeks gestation of pregnancy: Secondary | ICD-10-CM

## 2018-07-04 DIAGNOSIS — O10912 Unspecified pre-existing hypertension complicating pregnancy, second trimester: Secondary | ICD-10-CM

## 2018-07-04 DIAGNOSIS — Z6841 Body Mass Index (BMI) 40.0 and over, adult: Secondary | ICD-10-CM

## 2018-07-04 DIAGNOSIS — O99212 Obesity complicating pregnancy, second trimester: Secondary | ICD-10-CM

## 2018-07-04 DIAGNOSIS — E039 Hypothyroidism, unspecified: Secondary | ICD-10-CM

## 2018-07-04 DIAGNOSIS — O10919 Unspecified pre-existing hypertension complicating pregnancy, unspecified trimester: Secondary | ICD-10-CM

## 2018-07-04 DIAGNOSIS — O0990 Supervision of high risk pregnancy, unspecified, unspecified trimester: Secondary | ICD-10-CM

## 2018-07-04 NOTE — Progress Notes (Signed)
Routine Prenatal Care Visit  Subjective  Debra Bender is a 33 y.o. G3P2002 at [redacted]w[redacted]d being seen today for ongoing prenatal care.  She is currently monitored for the following issues for this high-risk pregnancy and has Hypothyroidism; History of thyroid disorder; Gastroesophageal reflux disease without esophagitis; Essential hypertension; Supervision of high risk pregnancy, antepartum; Family history of congenital heart disease; BMI 45.0-49.9, adult (HCC); Obesity affecting pregnancy, antepartum; and Chronic hypertension affecting pregnancy on their problem list.  ----------------------------------------------------------------------------------- Patient reports no complaints.   Contractions: Not present. Vag. Bleeding: None.  Movement: Present. Denies leaking of fluid.  U/S shows incomplete anatomy. Discussed choroid plexus cyst finding.  No need for specific follow up. Though patient will get many ultrasounds in her pregnancy. Is taking amlodipine without issues.  ----------------------------------------------------------------------------------- The following portions of the patient's history were reviewed and updated as appropriate: allergies, current medications, past family history, past medical history, past social history, past surgical history and problem list. Problem list updated.   Objective  Blood pressure 124/84, weight 272 lb (123.4 kg), last menstrual period 02/03/2018, currently breastfeeding. Pregravid weight 270 lb (122.5 kg) Total Weight Gain 2 lb (0.907 kg) Urinalysis: Urine Protein    Urine Glucose    Fetal Status: Fetal Heart Rate (bpm): Present   Movement: Present     General:  Alert, oriented and cooperative. Patient is in no acute distress.  Skin: Skin is warm and dry. No rash noted.   Cardiovascular: Normal heart rate noted  Respiratory: Normal respiratory effort, no problems with respiration noted  Abdomen: Soft, gravid, appropriate for gestational age.  Pain/Pressure: Absent     Pelvic:  Cervical exam deferred        Extremities: Normal range of motion.     Mental Status: Normal mood and affect. Normal behavior. Normal judgment and thought content.   Imaging Results US Ob Comp + 14 Wk  Addendum Date: 07/04/2018   Not noted in this report was a choroid plexus cyst. No specific follow up recommended. Thomasene Mohair, MD, [facility], [facility] 07/04/2018 10:00 AM    Result Date: 07/04/2018 ULTRASOUND REPORT Location: [facility] Date of Service: 07/04/2018 Patient Name: Debra Bender DOB: Dec 19, 1985 MRN: [id number] Indications:Anatomy Ultrasound Findings: Mason Jim intrauterine pregnancy is visualized with FHR at 143 BPM. Biometrics give an (U/S) Gestational age of [redacted]w[redacted]d and an (U/S) EDD of 11/20/2018; this correlates with the clinically established Estimated Date of Delivery: 8/26/202.  Fetal presentation is transverse. EFW: 334 gram (12 oz). Placenta: anterior. Grade: 0 AFI: subjectively normal. Anatomic survey is incomplete for face, ACI, upper extremities and diaphragm due to fetal position and normal; Gender - female.  Right Ovary is normal in appearance. Left Ovary is normal appearance. Survey of the adnexa demonstrates no adnexal masses. There is no free peritoneal fluid in the cul de sac. Impression: 1. [redacted]w[redacted]d Viable Singleton Intrauterine pregnancy by U/S. 2. (U/S) EDD is consistent with Clinically established Estimated Date of Delivery: 11/27/18 . 3. Normal Anatomy Scan Recommendations: 1.Clinical correlation with the patient's History and Physical Exam. 2. Follow up ultrasound to complete views not noted. Mital bahen P Patel, RDMS There is a singleton gestation with subjectively normal amniotic fluid volume. The fetal biometry correlates with established dating. Detailed evaluation of the fetal anatomy was performed.The fetal anatomical survey appears within normal limits within the resolution of ultrasound as described  above.  Not all structures were able to be visualized on today's study.  It is recommended that a follow-up ultrasound  be performed to complete visualization of the unobserved structures today.  It must be noted that a normal ultrasound is unable to rule out fetal aneuploidy nor is it able to detect all possible malformations.    The ultrasound images and findings were reviewed by me and I agree with the above report. Thomasene Mohair, MD, [facility], [facility] 07/04/2018 9:45 AM       Assessment   33 y.o. [id number] at [redacted]w[redacted]d by  11/27/2018, by Ultrasound presenting for routine prenatal visit  Plan   pregnancy 3 Problems (from 02/03/18 to present)    Problem Noted Resolved   Chronic hypertension affecting pregnancy 07/04/2018 by Conard Novak, MD No   Overview Addendum 07/04/2018  9:49 AM by Conard Novak, MD     Aspirin 81 mg daily after 12 weeks; discontinue after 36 weeks  baseline labs with CBC, CMP, urine protein/creatinine ratio  no BP meds unless BPs become elevated  ultrasound for growth at 28, 32, 36 weeks  Aspirin 81 mg daily after 12 weeks; discontinue after 36 weeks  Baseline EKG   Current antihypertensives:  amlodipine   Baseline and surveillance labs (pulled in from [facility], refresh links as needed)  Lab Results  Component Value Date   PLT 304 04/16/2018   CREATININE 0.76 04/16/2018   AST 11 04/16/2018   ALT 11 04/16/2018   PROTCRRATIO 0.09 05/20/2017   PROTEIN24HR 257 (H) 11/24/2016    Antenatal Testing CHTN - O10.919  Group I  BP < 140/90, no preeclampsia, AGA,  nml AFV, +/- meds    Group II BP > 140/90, on meds, no preeclampsia, AGA, nml AFV  20-28-34-38  20-24-28-32-35-38  32//2 x wk  28//BPP wkly then 32//2 x wk  40 no meds; 39 meds  PRN or 37  Pre-eclampsia  GHTN - O13.9/Preeclampsia without severe features  - O14.00   Preeclampsia with severe features - O14.10  Q 3-4wks  Q 2 wks  28//BPP wkly then  32//2 x wk  Inpatient  37  PRN or 34         Obesity affecting pregnancy, antepartum 04/04/2018 by Tresea Mall, CNM No   Overview Signed 07/04/2018  9:50 AM by Conard Novak, MD    BMI >=40  early 1h gtt -   u/s for dating    nutritional goals  folic acid   bASA (>12 weeks)  consider nutrition consult  consider maternal EKG 1st trimester  Growth u/s 28 , 32 , 36 weeks   NST/AFI weekly 36+ weeks (36[] , 37[] , 38[] , 39[] , 40[] )  IOL by 41 weeks (scheduled, prn )  Anesthesia referral p 28 weeks       Family history of congenital heart disease  by Katrina Stack No   Overview Signed 11/18/2016  1:11 PM by Farrel Conners, CNM    Seen by genetic counselor. Fetal echo scheduled      Supervision of high risk pregnancy, antepartum 10/12/2016 by Tresea Mall, CNM No   Overview Addendum 07/04/2018  9:47 AM by Conard Novak, MD    Clinic [facility] Prenatal Labs  Dating 7wk u/s Blood type: B/Positive/-- (07/25 4540) B POS  Genetic Screen    NIPT Diploid XY Antibody:Negative (07/25 0952)neg  Anatomic [place] Incomplete 4/2 Rubella: 1.50 (07/25 0952) Immune Varicella:   immune  GTT Early: normal RPR: Non Reactive (07/25 0952) negative  Rhogam n/a HBsAg: Negative (07/25  9702)   TDaP vaccine   Flu Shot: 03/07/18 HIV:     Baby Food  Breast                              GBS:   Contraception Considering vasec Pap:10/12/16 NIL  CBB     CS/VBAC NA   Support Person Husband Aurther Loft       High risk due to [employer], hx of preeclampsia, morbid obesity, family hx of congenital heart disease, borderline hypothyroidism           Preterm labor symptoms and general obstetric precautions including but not limited to vaginal bleeding, contractions, leaking of fluid and fetal movement were reviewed in detail with the patient. Please refer to After Visit Summary for other counseling recommendations.   - follow up anatomy u/s - schedule fetal echo due  to family history of cardiac defects - Thyroid labs today  Return in about 2 weeks (around 07/18/2018) for (2-4 weeks) u/s for anatomy completio, ROB.  Thomasene Mohair, MD, [facility], [facility] 07/04/2018 10:13 AM

## 2018-07-05 LAB — TSH+FREE T4
Free T4: 1.06 ng/dL (ref 0.82–1.77)
TSH: 4.68 u[IU]/mL — ABNORMAL HIGH (ref 0.450–4.500)

## 2018-07-17 ENCOUNTER — Encounter: Payer: Self-pay | Admitting: Obstetrics and Gynecology

## 2018-07-17 ENCOUNTER — Ambulatory Visit (INDEPENDENT_AMBULATORY_CARE_PROVIDER_SITE_OTHER): Payer: BLUE CROSS/BLUE SHIELD | Admitting: Obstetrics and Gynecology

## 2018-07-17 ENCOUNTER — Ambulatory Visit (INDEPENDENT_AMBULATORY_CARE_PROVIDER_SITE_OTHER): Payer: BLUE CROSS/BLUE SHIELD

## 2018-07-17 ENCOUNTER — Other Ambulatory Visit: Payer: Self-pay

## 2018-07-17 VITALS — BP 132/84 | Wt 274.0 lb

## 2018-07-17 DIAGNOSIS — E039 Hypothyroidism, unspecified: Secondary | ICD-10-CM

## 2018-07-17 DIAGNOSIS — Z8279 Family history of other congenital malformations, deformations and chromosomal abnormalities: Secondary | ICD-10-CM

## 2018-07-17 DIAGNOSIS — O0990 Supervision of high risk pregnancy, unspecified, unspecified trimester: Secondary | ICD-10-CM

## 2018-07-17 DIAGNOSIS — Z6841 Body Mass Index (BMI) 40.0 and over, adult: Secondary | ICD-10-CM

## 2018-07-17 DIAGNOSIS — O10912 Unspecified pre-existing hypertension complicating pregnancy, second trimester: Secondary | ICD-10-CM | POA: Diagnosis not present

## 2018-07-17 DIAGNOSIS — O0992 Supervision of high risk pregnancy, unspecified, second trimester: Secondary | ICD-10-CM

## 2018-07-17 DIAGNOSIS — O10919 Unspecified pre-existing hypertension complicating pregnancy, unspecified trimester: Secondary | ICD-10-CM

## 2018-07-17 DIAGNOSIS — O99212 Obesity complicating pregnancy, second trimester: Secondary | ICD-10-CM

## 2018-07-17 DIAGNOSIS — Z3A21 21 weeks gestation of pregnancy: Secondary | ICD-10-CM | POA: Diagnosis not present

## 2018-07-17 DIAGNOSIS — O99210 Obesity complicating pregnancy, unspecified trimester: Secondary | ICD-10-CM

## 2018-07-17 NOTE — Progress Notes (Signed)
Routine Prenatal Care Visit  Subjective  Debra Bender is a 33 y.o. G3P2002 at 7322w0d being seen today for ongoing prenatal care.  She is currently monitored for the following issues for this high-risk pregnancy and has Hypothyroidism; History of thyroid disorder; Gastroesophageal reflux disease without esophagitis; Essential hypertension; Supervision of high risk pregnancy, antepartum; Family history of congenital heart disease; BMI 45.0-49.9, adult (HCC); Obesity affecting pregnancy, antepartum; and Chronic hypertension affecting pregnancy on their problem list.  ----------------------------------------------------------------------------------- Patient reports no complaints.   Contractions: Not present. Vag. Bleeding: None.  Movement: Present. Denies leaking of fluid.  U/S complete for anatomy today.  ----------------------------------------------------------------------------------- The following portions of the patient's history were reviewed and updated as appropriate: allergies, current medications, past family history, past medical history, past social history, past surgical history and problem list. Problem list updated.   Objective  Blood pressure 132/84, weight 274 lb (124.3 kg), last menstrual period 02/03/2018, currently breastfeeding. Pregravid weight 270 lb (122.5 kg) Total Weight Gain 4 lb (1.814 kg) Urinalysis: Urine Protein    Urine Glucose    Fetal Status: Fetal Heart Rate (bpm): present, normal   Movement: Present     General:  Alert, oriented and cooperative. Patient is in no acute distress.  Skin: Skin is warm and dry. No rash noted.   Cardiovascular: Normal heart rate noted  Respiratory: Normal respiratory effort, no problems with respiration noted  Abdomen: Soft, gravid, appropriate for gestational age. Pain/Pressure: Absent     Pelvic:  Cervical exam deferred        Extremities: Normal range of motion.     Mental Status: Normal mood and affect. Normal  behavior. Normal judgment and thought content.   Imaging Results Kore[place] Ob Follow Up  Result Date: 07/17/2018 Patient Name: Debra Bender DOB: Sep 08, 1985 MRN: [id number] ULTRASOUND REPORT Location: [facility] Date of Service: 07/17/2018 Indications: Anatomy follow up ultrasound Findings: Mason JimSingleton intrauterine pregnancy is visualized with FHR at 144 BPM. Fetal presentation is Cephalic. Placenta: anterior. Grade: 1 AFI: subjectively normal. Anatomic survey is complete. Follow up on: Face- Upper Extremities - ACI- Diaphragm. All seen today and appear to be WNL. There is no free peritoneal fluid in the cul de sac. Impression: 1. 6522w0d Viable Singleton Intrauterine pregnancy previously established criteria. 2. Normal Anatomy Scan is now complete Clydene LamingJenine M Alessi, RDMS There is a singleton gestation with subjectively normal amniotic fluid volume. Detailed evaluation of the fetal anatomy was performed. The fetal anatomical survey appears within normal limits within the resolution of ultrasound as described above.  Not all structures were attempted to be visualized as they were noted on a previous exam. See the prior exam for full details.  It must be noted that a normal ultrasound is unable to rule out fetal aneuploidy nor is it able to detect all possible malformations. The ultrasound images and findings were reviewed by me and I agree with the above report. Thomasene MohairStephen Zackerie Sara, MD, [facility], [facility] 07/17/2018 10:43 AM       Assessment   33 y.o. [id number] at 6222w0d by  11/27/2018, by Ultrasound presenting for routine prenatal visit  Plan   pregnancy 3 Problems (from 02/03/18 to present)    Problem Noted Resolved   Chronic hypertension affecting pregnancy 07/04/2018 by Conard NovakJackson, Aadan Chenier D, MD No   Overview Addendum 07/04/2018  9:49 AM by Conard NovakJackson, Guiseppe Flanagan D, MD    [x]  Aspirin 81 mg daily after 12 weeks; discontinue after 36 weeks [x]  baseline labs with CBC, CMP,  urine  protein/creatinine ratio  no BP meds unless BPs become elevated  ultrasound for growth at 28, 32, 36 weeks  Aspirin 81 mg daily after 12 weeks; discontinue after 36 weeks  Baseline EKG   Current antihypertensives:  amlodipine   Baseline and surveillance labs (pulled in from [facility], refresh links as needed)  Lab Results  Component Value Date   PLT 304 04/16/2018   CREATININE 0.76 04/16/2018   AST 11 04/16/2018   ALT 11 04/16/2018   PROTCRRATIO 0.09 05/20/2017   PROTEIN24HR 257 (H) 11/24/2016    Antenatal Testing CHTN - O10.919  Group I  BP < 140/90, no preeclampsia, AGA,  nml AFV, +/- meds    Group II BP > 140/90, on meds, no preeclampsia, AGA, nml AFV  20-28-34-38  20-24-28-32-35-38  32//2 x wk  28//BPP wkly then 32//2 x wk  40 no meds; 39 meds  PRN or 37  Pre-eclampsia  GHTN - O13.9/Preeclampsia without severe features  - O14.00   Preeclampsia with severe features - O14.10  Q 3-4wks  Q 2 wks  28//BPP wkly then 32//2 x wk  Inpatient  37  PRN or 34         Obesity affecting pregnancy, antepartum 04/04/2018 by Tresea Mall, CNM No   Overview Signed 07/04/2018  9:50 AM by Conard Novak, MD    BMI >=40  early 1h gtt -   u/s for dating    nutritional goals  folic acid   bASA (>12 weeks)  consider nutrition consult  consider maternal EKG 1st trimester  Growth u/s 28 , 32 , 36 weeks   NST/AFI weekly 36+ weeks (36[] , 37[] , 38[] , 39[] , 40[] )  IOL by 41 weeks (scheduled, prn )  Anesthesia referral p 28 weeks       Family history of congenital heart disease  by Katrina Stack No   Overview Signed 11/18/2016  1:11 PM by Farrel Conners, CNM    Seen by genetic counselor. Fetal echo scheduled      Supervision of high risk pregnancy, antepartum 10/12/2016 by Tresea Mall, CNM No   Overview Addendum 07/04/2018  9:47 AM by Conard Novak, MD    Clinic [facility] Prenatal Labs  Dating 7wk u/s  Blood type: B/Positive/-- (07/25 7829) B POS  Genetic Screen    NIPT Diploid XY Antibody:Negative (07/25 0952)neg  Anatomic [place] Incomplete 4/2 Rubella: 1.50 (07/25 0952) Immune Varicella:   immune  GTT Early: normal RPR: Non Reactive (07/25 0952) negative  Rhogam n/a HBsAg: Negative (07/25 0952)   TDaP vaccine   Flu Shot: 03/07/18 HIV:     Baby Food  Breast                              GBS:   Contraception Considering vasec Pap:10/12/16 NIL  CBB     CS/VBAC NA   Support Person Husband Terry       High risk due to [employer], hx of preeclampsia, morbid obesity, family hx of congenital heart disease, borderline hypothyroidism           Preterm labor symptoms and general obstetric precautions including but not limited to vaginal bleeding, contractions, leaking of fluid and fetal movement were reviewed in detail with the patient. Please refer to After Visit Summary for other counseling recommendations.   - fetal echo not yet set up from pediatric cards  office.   Return in about 4 weeks (around 08/14/2018) for Routine Prenatal Appointment/telephone.  Thomasene Mohair, MD, [facility], [facility] 07/17/2018 11:01 AM

## 2018-08-14 ENCOUNTER — Encounter: Payer: Self-pay | Admitting: Advanced Practice Midwife

## 2018-08-14 ENCOUNTER — Ambulatory Visit (INDEPENDENT_AMBULATORY_CARE_PROVIDER_SITE_OTHER): Payer: BLUE CROSS/BLUE SHIELD | Admitting: Advanced Practice Midwife

## 2018-08-14 ENCOUNTER — Other Ambulatory Visit: Payer: Self-pay

## 2018-08-14 DIAGNOSIS — Z113 Encounter for screening for infections with a predominantly sexual mode of transmission: Secondary | ICD-10-CM

## 2018-08-14 DIAGNOSIS — Z130 Encounter for screening for diseases of the blood and blood-forming organs and certain disorders involving the immune mechanism: Secondary | ICD-10-CM

## 2018-08-14 DIAGNOSIS — O10912 Unspecified pre-existing hypertension complicating pregnancy, second trimester: Secondary | ICD-10-CM

## 2018-08-14 DIAGNOSIS — Z131 Encounter for screening for diabetes mellitus: Secondary | ICD-10-CM

## 2018-08-14 DIAGNOSIS — Z3A25 25 weeks gestation of pregnancy: Secondary | ICD-10-CM

## 2018-08-14 DIAGNOSIS — O0990 Supervision of high risk pregnancy, unspecified, unspecified trimester: Secondary | ICD-10-CM

## 2018-08-14 DIAGNOSIS — O10919 Unspecified pre-existing hypertension complicating pregnancy, unspecified trimester: Secondary | ICD-10-CM

## 2018-08-14 NOTE — Progress Notes (Signed)
Routine Prenatal Care Visit- Virtual Visit  Subjective   Virtual Visit via Telephone Note  I connected with Debra Bender on 08/14/18 at  9:30 AM EDT by telephone and verified that I am speaking with the correct person using two identifiers.   I discussed the limitations, risks, security and privacy concerns of performing an evaluation and management service by telephone and the availability of in person appointments. I also discussed with the patient that there may be a patient responsible charge related to this service. The patient expressed understanding and agreed to proceed.  The patient was at her home I spoke with the patient from my  Office phone The names of people involved in this encounter were: the patient Debra AlleyDanielle Vantrease, her husband, and myself Tresea MallJane Khloei Spiker, CNM.   Gomez CleverlyDanielle D Bender is a 33 y.o. G3P2002 at 5477w0d being seen today for ongoing prenatal care.  She is currently monitored for the following issues for this high-risk pregnancy and has Hypothyroidism; History of thyroid disorder; Gastroesophageal reflux disease without esophagitis; Essential hypertension; Supervision of high risk pregnancy, antepartum; Family history of congenital heart disease; BMI 45.0-49.9, adult (HCC); Obesity affecting pregnancy, antepartum; and Chronic hypertension affecting pregnancy on their problem list.  ----------------------------------------------------------------------------------- Patient reports headaches sometimes. She takes tylenol with some relief. She has been checking her blood pressure every 2-3 hours due to her headaches. Readings range from 140's to 160's over 80's to 90's. She denies any blurry vision or epigastric pain. She has been taking her Norvasc as prescribed. She denies any cramping, fluid leaking or vaginal bleeding. She admits good fetal movement.   Contractions: Not present. Vag. Bleeding: None.  Movement: Present. Denies leaking of fluid.   ----------------------------------------------------------------------------------- The following portions of the patient's history were reviewed and updated as appropriate: allergies, current medications, past family history, past medical history, past social history, past surgical history and problem list. Problem list updated.   Objective  Last menstrual period 02/03/2018, currently breastfeeding. Pregravid weight 270 lb (122.5 kg) Total Weight Gain 4 lb (1.814 kg) Urinalysis:      Fetal Status:     Movement: Present     Physical Exam could not be performed. Because of the COVID-19 outbreak this visit was performed over the phone and not in person.   Assessment   33 y.o. G3P2002 at 977w0d by  11/27/2018, by Ultrasound presenting for routine prenatal visit  Plan   pregnancy 3 Problems (from 02/03/18 to present)    Problem Noted Resolved   Chronic hypertension affecting pregnancy 07/04/2018 by Conard NovakJackson, Stephen D, MD No   Overview Addendum 07/04/2018  9:49 AM by Conard NovakJackson, Stephen D, MD    [x]  Aspirin 81 mg daily after 12 weeks; discontinue after 36 weeks [x]  baseline labs with CBC, CMP, urine protein/creatinine ratio [ ]  no BP meds unless BPs become elevated [ ]  ultrasound for growth at 28, 32, 36 weeks [x]  Aspirin 81 mg daily after 12 weeks; discontinue after 36 weeks [ ]  Baseline EKG   Current antihypertensives:  amlodipine   [facility] refresh [facility] as needed)  Lab Results  Component Value Date   PLT 304 04/16/2018   CREATININE 0.76 04/16/2018   AST 11 04/16/2018   ALT 11 04/16/2018   PROTCRRATIO 0.09 05/20/2017   PROTEIN24HR 257 (H) 11/24/2016    Antenatal Testing CHTN - O10.919  Group I  BP < 140/90, no preeclampsia, AGA,  nml AFV, +/- meds    Group II BP >  140/90, on meds, no preeclampsia, AGA, nml AFV  20-28-34-38  20-24-28-32-35-38  32//2 x wk  28//BPP wkly then 32//2 x wk  40 no meds; 39 meds  PRN or 37   Pre-eclampsia  GHTN - O13.9/Preeclampsia without severe features  - O14.00   Preeclampsia with severe features - O14.10  Q 3-4wks  Q 2 wks  28//BPP wkly then 32//2 x wk  Inpatient  37  PRN or 34         Obesity affecting pregnancy, antepartum 04/04/2018 by Tresea Mall, CNM No   Overview Signed 07/04/2018  9:50 AM by Conard Novak, MD    BMI >=40  early 1h gtt -   u/s for dating    nutritional goals  folic acid   bASA (>12 weeks)  consider nutrition consult  consider maternal EKG 1st trimester  Growth u/s 28 , 32 , 36 weeks   NST/AFI weekly 36+ weeks (36[] , 37[] , 38[] , 39[] , 40[] )  IOL by 41 weeks (scheduled, prn )  Anesthesia referral p 28 weeks       Family history of congenital heart disease  by Katrina Stack No   Overview Signed 11/18/2016  1:11 PM by Farrel Conners, CNM    Seen by genetic counselor. Fetal echo scheduled      Supervision of high risk pregnancy, antepartum 10/12/2016 by Tresea Mall, CNM No   Overview Addendum 07/04/2018  9:47 AM by Conard Novak, MD    Clinic [facility] Prenatal Labs  Dating 7wk u/s Blood type: B/Positive/-- (07/25 1610) B POS  Genetic Screen    NIPT Diploid XY Antibody:Negative (07/25 0952)neg  Anatomic [place] Incomplete 4/2 Rubella: 1.50 (07/25 0952) Immune Varicella:   immune  GTT Early: normal RPR: Non Reactive (07/25 0952) negative  Rhogam n/a HBsAg: Negative (07/25 0952)   TDaP vaccine   Flu Shot: 03/07/18 HIV:     Baby Food  Breast                              GBS:   Contraception Considering vasec Pap:10/12/16 NIL  CBB     CS/VBAC NA   Support Person Husband Terry       High risk due to [employer], hx of preeclampsia, morbid obesity, family hx of congenital heart disease, borderline hypothyroidism           Gestational age appropriate obstetric precautions including but not limited to vaginal bleeding, contractions, leaking of fluid and fetal movement were  reviewed in detail with the patient.     Follow Up Instructions: Check BP 2x per day Stay well hydrated Healthy diet Balance activity with rest especially with s/s hypertension  Return in 2 days for BP check and PIH labs (future ordered)   I discussed the assessment and treatment plan with the patient. The patient was provided an opportunity to ask questions and all were answered. The patient agreed with the plan and demonstrated an understanding of the instructions.   The patient was advised to call back or seek an in-person evaluation if the symptoms worsen or if the condition fails to improve as anticipated.  I provided 10 minutes of non-face-to-face time during this encounter.  Return in about 2 days (around 08/16/2018) for needs BP check visit in 2 days and growth scan, 28 wk visit and rob.  Parke Poisson, CNM [facility]  08/14/2018, 9:40 AM

## 2018-08-14 NOTE — Progress Notes (Signed)
Pt states her bp has been elevated, no vb no lof

## 2018-08-16 ENCOUNTER — Ambulatory Visit (INDEPENDENT_AMBULATORY_CARE_PROVIDER_SITE_OTHER): Payer: BLUE CROSS/BLUE SHIELD | Admitting: Advanced Practice Midwife

## 2018-08-16 ENCOUNTER — Encounter: Payer: Self-pay | Admitting: Advanced Practice Midwife

## 2018-08-16 ENCOUNTER — Other Ambulatory Visit: Payer: Self-pay

## 2018-08-16 VITALS — BP 148/90 | Wt 287.0 lb

## 2018-08-16 DIAGNOSIS — Z0130 Encounter for examination of blood pressure without abnormal findings: Secondary | ICD-10-CM

## 2018-08-16 DIAGNOSIS — O10912 Unspecified pre-existing hypertension complicating pregnancy, second trimester: Secondary | ICD-10-CM

## 2018-08-16 DIAGNOSIS — Z3A25 25 weeks gestation of pregnancy: Secondary | ICD-10-CM

## 2018-08-16 NOTE — Progress Notes (Signed)
Routine Prenatal Care Visit  Subjective  Debra Bender is a 33 y.o. G3P2002 at 4352w2d being seen today for ongoing prenatal care.  She is currently monitored for the following issues for this high-risk pregnancy and has Hypothyroidism; History of thyroid disorder; Gastroesophageal reflux disease without esophagitis; Essential hypertension; Supervision of high risk pregnancy, antepartum; Family history of congenital heart disease; BMI 45.0-49.9, adult (HCC); Obesity affecting pregnancy, antepartum; and Chronic hypertension affecting pregnancy on their problem list.  ----------------------------------------------------------------------------------- Patient reports feeling well. She denies any headaches, visual changes or epigastric pain. She is taking her BP medication as prescribed.  She has appointment for fetal echo at the end of this month. Contractions: Not present. Vag. Bleeding: None.  Movement: Present. Denies leaking of fluid.  ----------------------------------------------------------------------------------- The following portions of the patient's history were reviewed and updated as appropriate: allergies, current medications, past family history, past medical history, past social history, past surgical history and problem list. Problem list updated.   Objective  Blood pressure (!) 148/90, weight 287 lb (130.2 kg), last menstrual period 02/03/2018, currently breastfeeding. Pregravid weight 270 lb (122.5 kg) Total Weight Gain 17 lb (7.711 kg) Urinalysis: Urine Protein    Urine Glucose    Fetal Status: Fetal Heart Rate (bpm): 137   Movement: Present     General:  Alert, oriented and cooperative. Patient is in no acute distress.  Skin: Skin is warm and dry. No rash noted.   Cardiovascular: Normal heart rate noted  Respiratory: Normal respiratory effort, no problems with respiration noted  Abdomen: Soft, gravid, appropriate for gestational age. Pain/Pressure: Absent     Pelvic:   Cervical exam deferred        Extremities: Normal range of motion.     Mental Status: Normal mood and affect. Normal behavior. Normal judgment and thought content.   Assessment   33 y.o. G3P2002 at 6452w2d by  11/27/2018, by Ultrasound presenting for routine prenatal visit  Plan   pregnancy 3 Problems (from 02/03/18 to present)    Problem Noted Resolved   Chronic hypertension affecting pregnancy 07/04/2018 by Conard NovakJackson, Stephen D, MD No   Overview Addendum 07/04/2018  9:49 AM by Conard NovakJackson, Stephen D, MD    [x]  Aspirin 81 mg daily after 12 weeks; discontinue after 36 weeks [x]  baseline labs with CBC, CMP, urine protein/creatinine ratio [ ]  no BP meds unless BPs become elevated [ ]  ultrasound for growth at 28, 32, 36 weeks [x]  Aspirin 81 mg daily after 12 weeks; discontinue after 36 weeks [ ]  Baseline EKG   Current antihypertensives:  amlodipine   Baseline and surveillance labs [facility] refresh links as needed)  Lab Results  Component Value Date   PLT 304 04/16/2018   CREATININE 0.76 04/16/2018   AST 11 04/16/2018   ALT 11 04/16/2018   PROTCRRATIO 0.09 05/20/2017   PROTEIN24HR 257 (H) 11/24/2016    Antenatal Testing CHTN - O10.919  Group I  BP < 140/90, no preeclampsia, AGA,  nml AFV, +/- meds    Group II BP > 140/90, on meds, no preeclampsia, AGA, nml AFV  20-28-34-38  20-24-28-32-35-38  32//2 x wk  28//BPP wkly then 32//2 x wk  40 no meds; 39 meds  PRN or 37  Pre-eclampsia  GHTN - O13.9/Preeclampsia without severe features  - O14.00   Preeclampsia with severe features - O14.10  Q 3-4wks  Q 2 wks  28//BPP wkly then 32//2 x wk  Inpatient  37  PRN or 34  Obesity affecting pregnancy, antepartum 04/04/2018 by Tresea Mall, CNM No   Overview Signed 07/04/2018  9:50 AM by Conard Novak, MD    BMI >=40 [x]  early 1h gtt -  [x]  u/s for dating [x]   [ ]  nutritional goals [ ]  folic acid 1mg  [x]  bASA (>12 weeks) [ ]  consider nutrition consult [  ] consider maternal EKG 1st trimester [ ]  Growth u/s 28 [ ] , 32 [ ] , 36 weeks [ ]  [ ]  NST/AFI weekly 36+ weeks (36[] , 37[] , 38[] , 39[] , 40[] ) [ ]  IOL by 41 weeks (scheduled, prn [] ) [ ]  Anesthesia referral p 28 weeks       Family history of congenital heart disease  by Katrina Stack No   Overview Signed 11/18/2016  1:11 PM by Farrel Conners, CNM    Seen by genetic counselor. Fetal echo scheduled      Supervision of high risk pregnancy, antepartum 10/12/2016 by Tresea Mall, CNM No   Overview Addendum 07/04/2018  9:47 AM by Conard Novak, MD    Clinic [facility] Prenatal Labs  Dating 7wk u/s Blood type: B/Positive/-- (07/25 1031) B POS  Genetic Screen    NIPT Diploid XY Antibody:Negative (07/25 0952)neg  Anatomic [place] Incomplete 4/2 Rubella: 1.50 (07/25 0952) Immune Varicella:   immune  GTT Early: normal RPR: Non Reactive (07/25 0952) negative  Rhogam n/a HBsAg: Negative (07/25 0952)   TDaP vaccine   Flu Shot: 03/07/18 HIV:     Baby Food  Breast                              GBS:   Contraception Considering vasec Pap:10/12/16 NIL  CBB     CS/VBAC NA   Support Person Husband Terry       High risk due to [employer], hx of preeclampsia, morbid obesity, family hx of congenital heart disease, borderline hypothyroidism           Preterm labor symptoms and general obstetric precautions including but not limited to vaginal bleeding, contractions, leaking of fluid and fetal movement were reviewed in detail with the patient.   Return for has follow up scheduled.  Tresea Mall, CNM 08/16/2018 4:12 PM

## 2018-08-16 NOTE — Progress Notes (Signed)
BP check  

## 2018-08-19 ENCOUNTER — Other Ambulatory Visit: Payer: Self-pay | Admitting: Maternal Newborn

## 2018-08-19 DIAGNOSIS — O0990 Supervision of high risk pregnancy, unspecified, unspecified trimester: Secondary | ICD-10-CM

## 2018-08-19 DIAGNOSIS — R519 Headache, unspecified: Secondary | ICD-10-CM

## 2018-08-19 DIAGNOSIS — O26899 Other specified pregnancy related conditions, unspecified trimester: Secondary | ICD-10-CM

## 2018-09-12 ENCOUNTER — Ambulatory Visit (INDEPENDENT_AMBULATORY_CARE_PROVIDER_SITE_OTHER): Payer: BC Managed Care – PPO | Admitting: Maternal Newborn

## 2018-09-12 ENCOUNTER — Other Ambulatory Visit: Payer: Self-pay

## 2018-09-12 ENCOUNTER — Ambulatory Visit (INDEPENDENT_AMBULATORY_CARE_PROVIDER_SITE_OTHER): Payer: BC Managed Care – PPO

## 2018-09-12 ENCOUNTER — Other Ambulatory Visit: Payer: BC Managed Care – PPO

## 2018-09-12 VITALS — BP 148/96 | Wt 290.0 lb

## 2018-09-12 DIAGNOSIS — Z8639 Personal history of other endocrine, nutritional and metabolic disease: Secondary | ICD-10-CM

## 2018-09-12 DIAGNOSIS — O0990 Supervision of high risk pregnancy, unspecified, unspecified trimester: Secondary | ICD-10-CM

## 2018-09-12 DIAGNOSIS — O10913 Unspecified pre-existing hypertension complicating pregnancy, third trimester: Secondary | ICD-10-CM

## 2018-09-12 DIAGNOSIS — O10919 Unspecified pre-existing hypertension complicating pregnancy, unspecified trimester: Secondary | ICD-10-CM

## 2018-09-12 DIAGNOSIS — Z362 Encounter for other antenatal screening follow-up: Secondary | ICD-10-CM

## 2018-09-12 DIAGNOSIS — Z113 Encounter for screening for infections with a predominantly sexual mode of transmission: Secondary | ICD-10-CM

## 2018-09-12 DIAGNOSIS — O99210 Obesity complicating pregnancy, unspecified trimester: Secondary | ICD-10-CM

## 2018-09-12 DIAGNOSIS — Z130 Encounter for screening for diseases of the blood and blood-forming organs and certain disorders involving the immune mechanism: Secondary | ICD-10-CM

## 2018-09-12 DIAGNOSIS — O0993 Supervision of high risk pregnancy, unspecified, third trimester: Secondary | ICD-10-CM

## 2018-09-12 DIAGNOSIS — Z131 Encounter for screening for diabetes mellitus: Secondary | ICD-10-CM

## 2018-09-12 DIAGNOSIS — O99213 Obesity complicating pregnancy, third trimester: Secondary | ICD-10-CM

## 2018-09-12 DIAGNOSIS — Z3A29 29 weeks gestation of pregnancy: Secondary | ICD-10-CM

## 2018-09-12 NOTE — Progress Notes (Signed)
Routine Prenatal Care Visit  Subjective  Gomez CleverlyDanielle D Behrman is a 33 y.o. G3P2002 at 4280w1d being seen today for ongoing prenatal care.  She is currently monitored for the following issues for this high-risk pregnancy and has Hypothyroidism; History of thyroid disorder; Gastroesophageal reflux disease without esophagitis; Essential hypertension; Supervision of high risk pregnancy, antepartum; Family history of congenital heart disease; BMI 45.0-49.9, adult (HCC); Obesity affecting pregnancy, antepartum; and Chronic hypertension affecting pregnancy on their problem list.  ----------------------------------------------------------------------------------- Patient reports some elevated blood pressure readings at home. She has had some dizziness. No headaches, visual changes, edema, or epigastric pain. Contractions: Not present. Vag. Bleeding: None.  Movement: Present. No leaking of fluid.  ----------------------------------------------------------------------------------- The following portions of the patient's history were reviewed and updated as appropriate: allergies, current medications, past family history, past medical history, past social history, past surgical history and problem list. Problem list updated.   Objective  Blood pressure (!) 148/96, weight 290 lb (131.5 kg), last menstrual period 02/03/2018, currently breastfeeding. Pregravid weight 270 lb (122.5 kg) Total Weight Gain 20 lb (9.072 kg)  Fetal Status: Fetal Heart Rate (bpm): 146   Movement: Present     General:  Alert, oriented and cooperative. Patient is in no acute distress.  Skin: Skin is warm and dry. No rash noted.   Cardiovascular: Normal heart rate noted  Respiratory: Normal respiratory effort, no problems with respiration noted  Abdomen: Soft, gravid, appropriate for gestational age. Pain/Pressure: Absent     Pelvic:  Cervical exam deferred        Extremities: Normal range of motion.     Mental Status: Normal mood  and affect. Normal behavior. Normal judgment and thought content.     Assessment   33 y.o. G9F6213G3P2002 at 780w1d, EDD 11/27/2018 by Ultrasound presenting for a routine prenatal visit.  Plan   pregnancy 3 Problems (from 02/03/18 to present)    Problem Noted Resolved   Chronic hypertension affecting pregnancy 07/04/2018 by Conard NovakJackson, Stephen D, MD No   Overview Addendum 07/04/2018  9:49 AM by Conard NovakJackson, Stephen D, MD    [x]  Aspirin 81 mg daily after 12 weeks; discontinue after 36 weeks [x]  baseline labs with CBC, CMP, urine protein/creatinine ratio [ ]  no BP meds unless BPs become elevated [ ]  ultrasound for growth at 28, 32, 36 weeks [x]  Aspirin 81 mg daily after 12 weeks; discontinue after 36 weeks [ ]  Baseline EKG   Current antihypertensives:  amlodipine   Baseline and surveillance labs ([facility], refresh links as needed)  Lab Results  Component Value Date   PLT 304 04/16/2018   CREATININE 0.76 04/16/2018   AST 11 04/16/2018   ALT 11 04/16/2018   PROTCRRATIO 0.09 05/20/2017   PROTEIN24HR 257 (H) 11/24/2016    Antenatal Testing CHTN - O10.919  Group I  BP < 140/90, no preeclampsia, AGA,  nml AFV, +/- meds    Group II BP > 140/90, on meds, no preeclampsia, AGA, nml AFV  20-28-34-38  20-24-28-32-35-38  32//2 x wk  28//BPP wkly then 32//2 x wk  40 no meds; 39 meds  PRN or 37  Pre-eclampsia  GHTN - O13.9/Preeclampsia without severe features  - O14.00   Preeclampsia with severe features - O14.10  Q 3-4wks  Q 2 wks  28//BPP wkly then 32//2 x wk  Inpatient  37  PRN or 34         Obesity affecting pregnancy, antepartum 04/04/2018 by Tresea MallGledhill, Jane, CNM No   Overview Signed  07/04/2018  9:50 AM by Will Bonnet, MD    BMI >=40 [x]  early 1h gtt -  [x]  u/s for dating [x]   [ ]  nutritional goals [ ]  folic acid 1mg  [x]  bASA (>12 weeks) [ ]  consider nutrition consult [ ]  consider maternal EKG 1st trimester [ ]  Growth u/s 28 [ ] , 32 [ ] , 36 weeks [ ]  [ ]   NST/AFI weekly 36+ weeks (36[] , 37[] , 38[] , 39[] , 40[] ) [ ]  IOL by 41 weeks (scheduled, prn [] ) [ ]  Anesthesia referral p 28 weeks       Family history of congenital heart disease  by Donette Larry No   Overview Signed 11/18/2016  1:11 PM by Dalia Heading, CNM    Seen by genetic counselor. Fetal echo scheduled      Supervision of high risk pregnancy, antepartum 10/12/2016 by Rod Can, CNM No   Overview Addendum 07/04/2018  9:47 AM by Will Bonnet, MD    Clinic [facility] Prenatal Labs  Dating 7wk u/s Blood type: B/Positive/-- (07/25 4967) B POS  Genetic Screen    NIPT Diploid XY Antibody:Negative (07/25 0952)neg  Anatomic [place] Incomplete 4/2 Rubella: 1.50 (07/25 0952) Immune Varicella:   immune  GTT Early: normal RPR: Non Reactive (07/25 0952) negative  Rhogam n/a HBsAg: Negative (07/25 0952)   TDaP vaccine   Flu Shot: 03/07/18 HIV:     Baby Food  Breast                              GBS:   Contraception Considering vasec Pap:10/12/16 NIL  CBB     CS/VBAC NA   Support Person Husband Coralyn Mark       High risk due to [place], hx of preeclampsia, morbid obesity, family hx of congenital heart disease, borderline hypothyroidism        Normal fetal echo 5/18. Discussed plan of care with elevated BP with Dr. Gilman Schmidt. Will increase Norvasc to 10 mg daily. Labs today and close follow up next week for BP check.  Growth scan today showed growth at 77.2 percentile, EFW 3 lb 11 oz. Follow up growth in 4 weeks.  Please refer to After Visit Summary for other counseling recommendations.   Return in about 1 week (around 09/19/2018) for HROB/BP check with MD.  Avel Sensor, CNM 09/13/2018  8:10 AM

## 2018-09-12 NOTE — Progress Notes (Signed)
Pt states her BP has been elevated. No vb. No lof

## 2018-09-13 LAB — 28 WEEK RH+PANEL
Basophils Absolute: 0.1 10*3/uL (ref 0.0–0.2)
Basos: 0 %
EOS (ABSOLUTE): 0.1 10*3/uL (ref 0.0–0.4)
Eos: 1 %
Gestational Diabetes Screen: 107 mg/dL (ref 65–139)
HIV Screen 4th Generation wRfx: NONREACTIVE
Hematocrit: 33.8 % — ABNORMAL LOW (ref 34.0–46.6)
Hemoglobin: 11.1 g/dL (ref 11.1–15.9)
Immature Grans (Abs): 0.2 10*3/uL — ABNORMAL HIGH (ref 0.0–0.1)
Immature Granulocytes: 1 %
Lymphocytes Absolute: 2.7 10*3/uL (ref 0.7–3.1)
Lymphs: 17 %
MCH: 27.4 pg (ref 26.6–33.0)
MCHC: 32.8 g/dL (ref 31.5–35.7)
MCV: 84 fL (ref 79–97)
Monocytes Absolute: 0.9 10*3/uL (ref 0.1–0.9)
Monocytes: 5 %
Neutrophils Absolute: 12.3 10*3/uL — ABNORMAL HIGH (ref 1.4–7.0)
Neutrophils: 76 %
Platelets: 292 10*3/uL (ref 150–450)
RBC: 4.05 x10E6/uL (ref 3.77–5.28)
RDW: 13.8 % (ref 11.7–15.4)
RPR Ser Ql: NONREACTIVE
WBC: 16.3 10*3/uL — ABNORMAL HIGH (ref 3.4–10.8)

## 2018-09-13 LAB — COMPREHENSIVE METABOLIC PANEL
ALT: 6 IU/L (ref 0–32)
AST: 8 IU/L (ref 0–40)
Albumin/Globulin Ratio: 1.2 (ref 1.2–2.2)
Albumin: 3.6 g/dL — ABNORMAL LOW (ref 3.8–4.8)
Alkaline Phosphatase: 80 IU/L (ref 39–117)
BUN/Creatinine Ratio: 13 (ref 9–23)
BUN: 8 mg/dL (ref 6–20)
Bilirubin Total: <0.2 mg/dL (ref 0.0–1.2)
CO2: 20 mmol/L (ref 20–29)
Calcium: 9.1 mg/dL (ref 8.7–10.2)
Chloride: 100 mmol/L (ref 96–106)
Creatinine, Ser: 0.64 mg/dL (ref 0.57–1.00)
GFR calc Af Amer: 138 mL/min/{1.73_m2} (ref >59)
GFR calc non Af Amer: 119 mL/min/{1.73_m2} (ref >59)
Globulin, Total: 3.1 g/dL (ref 1.5–4.5)
Glucose: 114 mg/dL — ABNORMAL HIGH (ref 65–99)
Potassium: 4.2 mmol/L (ref 3.5–5.2)
Sodium: 134 mmol/L (ref 134–144)
Total Protein: 6.7 g/dL (ref 6.0–8.5)

## 2018-09-13 LAB — PROTEIN / CREATININE RATIO, URINE
Creatinine, Urine: 49.7 mg/dL
Protein, Ur: 11.8 mg/dL
Protein/Creat Ratio: 237 mg/g creat — ABNORMAL HIGH (ref 0–200)

## 2018-09-13 LAB — TSH+FREE T4
Free T4: 0.9 ng/dL (ref 0.82–1.77)
TSH: 2.21 u[IU]/mL (ref 0.450–4.500)

## 2018-09-13 MED ORDER — AMLODIPINE BESYLATE 5 MG PO TABS: 5.0000 mg | ORAL_TABLET | Freq: Two times a day (BID) | ORAL | 3 refills | Status: DC

## 2018-09-13 NOTE — Patient Instructions (Signed)
Third Trimester of Pregnancy The third trimester is from week 28 through week 40 (months 7 through 9). The third trimester is a time when the unborn baby (fetus) is growing rapidly. At the end of the ninth month, the fetus is about 20 inches in length and weighs 6-10 pounds. Body changes during your third trimester Your body will continue to go through many changes during pregnancy. The changes vary from woman to woman. During the third trimester:  Your weight will continue to increase. You can expect to gain 25-35 pounds (11-16 kg) by the end of the pregnancy.  You may begin to get stretch marks on your hips, abdomen, and breasts.  You may urinate more often because the fetus is moving lower into your pelvis and pressing on your bladder.  You may develop or continue to have heartburn. This is caused by increased hormones that slow down muscles in the digestive tract.  You may develop or continue to have constipation because increased hormones slow digestion and cause the muscles that push waste through your intestines to relax.  You may develop hemorrhoids. These are swollen veins (varicose veins) in the rectum that can itch or be painful.  You may develop swollen, bulging veins (varicose veins) in your legs.  You may have increased body aches in the pelvis, back, or thighs. This is due to weight gain and increased hormones that are relaxing your joints.  You may have changes in your hair. These can include thickening of your hair, rapid growth, and changes in texture. Some women also have hair loss during or after pregnancy, or hair that feels dry or thin. Your hair will most likely return to normal after your baby is born.  Your breasts will continue to grow and they will continue to become tender. A yellow fluid (colostrum) may leak from your breasts. This is the first milk you are producing for your baby.  Your belly button may stick out.  You may notice more swelling in your hands,  face, or ankles.  You may have increased tingling or numbness in your hands, arms, and legs. The skin on your belly may also feel numb.  You may feel short of breath because of your expanding uterus.  You may have more problems sleeping. This can be caused by the size of your belly, increased need to urinate, and an increase in your body's metabolism.  You may notice the fetus "dropping," or moving lower in your abdomen (lightening).  You may have increased vaginal discharge.  You may notice your joints feel loose and you may have pain around your pelvic bone. What to expect at prenatal visits You will have prenatal exams every 2 weeks until week 36. Then you will have weekly prenatal exams. During a routine prenatal visit:  You will be weighed to make sure you and the baby are growing normally.  Your blood pressure will be taken.  Your abdomen will be measured to track your baby's growth.  The fetal heartbeat will be listened to.  Any test results from the previous visit will be discussed.  You may have a cervical check near your due date to see if your cervix has softened or thinned (effaced).  You will be tested for Group B streptococcus. This happens between 35 and 37 weeks. Your health care provider may ask you:  What your birth plan is.  How you are feeling.  If you are feeling the baby move.  If you have had any abnormal   symptoms, such as leaking fluid, bleeding, severe headaches, or abdominal cramping.  If you are using any tobacco products, including cigarettes, chewing tobacco, and electronic cigarettes.  If you have any questions. Other tests or screenings that may be performed during your third trimester include:  Blood tests that check for low iron levels (anemia).  Fetal testing to check the health, activity level, and growth of the fetus. Testing is done if you have certain medical conditions or if there are problems during the pregnancy.  Nonstress test  (NST). This test checks the health of your baby to make sure there are no signs of problems, such as the baby not getting enough oxygen. During this test, a belt is placed around your belly. The baby is made to move, and its heart rate is monitored during movement. What is false labor? False labor is a condition in which you feel small, irregular tightenings of the muscles in the womb (contractions) that usually go away with rest, changing position, or drinking water. These are called Braxton Hicks contractions. Contractions may last for hours, days, or even weeks before true labor sets in. If contractions come at regular intervals, become more frequent, increase in intensity, or become painful, you should see your health care provider. What are the signs of labor?  Abdominal cramps.  Regular contractions that start at 10 minutes apart and become stronger and more frequent with time.  Contractions that start on the top of the uterus and spread down to the lower abdomen and back.  Increased pelvic pressure and dull back pain.  A watery or bloody mucus discharge that comes from the vagina.  Leaking of amniotic fluid. This is also known as your "water breaking." It could be a slow trickle or a gush. Let your health care provider know if it has a color or strange odor. If you have any of these signs, call your health care provider right away, even if it is before your due date. Follow these instructions at home: Medicines  Follow your health care provider's instructions regarding medicine use. Specific medicines may be either safe or unsafe to take during pregnancy.  Take a prenatal vitamin that contains at least 600 micrograms (mcg) of folic acid.  If you develop constipation, try taking a stool softener if your health care provider approves. Eating and drinking   Eat a balanced diet that includes fresh fruits and vegetables, whole grains, good sources of protein such as meat, eggs, or tofu,  and low-fat dairy. Your health care provider will help you determine the amount of weight gain that is right for you.  Avoid raw meat and uncooked cheese. These carry germs that can cause birth defects in the baby.  If you have low calcium intake from food, talk to your health care provider about whether you should take a daily calcium supplement.  Eat four or five small meals rather than three large meals a day.  Limit foods that are high in fat and processed sugars, such as fried and sweet foods.  To prevent constipation: ? Drink enough fluid to keep your urine clear or pale yellow. ? Eat foods that are high in fiber, such as fresh fruits and vegetables, whole grains, and beans. Activity  Exercise only as directed by your health care provider. Most women can continue their usual exercise routine during pregnancy. Try to exercise for 30 minutes at least 5 days a week. Stop exercising if you experience uterine contractions.  Avoid heavy lifting.  Do   not exercise in extreme heat or humidity, or at high altitudes.  Wear low-heel, comfortable shoes.  Practice good posture.  You may continue to have sex unless your health care provider tells you otherwise. Relieving pain and discomfort  Take frequent breaks and rest with your legs elevated if you have leg cramps or low back pain.  Take warm sitz baths to soothe any pain or discomfort caused by hemorrhoids. Use hemorrhoid cream if your health care provider approves.  Wear a good support bra to prevent discomfort from breast tenderness.  If you develop varicose veins: ? Wear support pantyhose or compression stockings as told by your healthcare provider. ? Elevate your feet for 15 minutes, 3-4 times a day. Prenatal care  Write down your questions. Take them to your prenatal visits.  Keep all your prenatal visits as told by your health care provider. This is important. Safety  Wear your seat belt at all times when driving.  Make  a list of emergency phone numbers, including numbers for family, friends, the hospital, and police and fire departments. General instructions  Avoid cat litter boxes and soil used by cats. These carry germs that can cause birth defects in the baby. If you have a cat, ask someone to clean the litter box for you.  Do not travel far distances unless it is absolutely necessary and only with the approval of your health care provider.  Do not use hot tubs, steam rooms, or saunas.  Do not drink alcohol.  Do not use any products that contain nicotine or tobacco, such as cigarettes and e-cigarettes. If you need help quitting, ask your health care provider.  Do not use any medicinal herbs or unprescribed drugs. These chemicals affect the formation and growth of the baby.  Do not douche or use tampons or scented sanitary pads.  Do not cross your legs for long periods of time.  To prepare for the arrival of your baby: ? Take prenatal classes to understand, practice, and ask questions about labor and delivery. ? Make a trial run to the hospital. ? Visit the hospital and tour the maternity area. ? Arrange for maternity or paternity leave through employers. ? Arrange for family and friends to take care of pets while you are in the hospital. ? Purchase a rear-facing car seat and make sure you know how to install it in your car. ? Pack your hospital bag. ? Prepare the baby's nursery. Make sure to remove all pillows and stuffed animals from the baby's crib to prevent suffocation.  Visit your dentist if you have not gone during your pregnancy. Use a soft toothbrush to brush your teeth and be gentle when you floss. Contact a health care provider if:  You are unsure if you are in labor or if your water has broken.  You become dizzy.  You have mild pelvic cramps, pelvic pressure, or nagging pain in your abdominal area.  You have lower back pain.  You have persistent nausea, vomiting, or  diarrhea.  You have an unusual or bad smelling vaginal discharge.  You have pain when you urinate. Get help right away if:  Your water breaks before 37 weeks.  You have regular contractions less than 5 minutes apart before 37 weeks.  You have a fever.  You are leaking fluid from your vagina.  You have spotting or bleeding from your vagina.  You have severe abdominal pain or cramping.  You have rapid weight loss or weight gain.  You have   shortness of breath with chest pain.  You notice sudden or extreme swelling of your face, hands, ankles, feet, or legs.  Your baby makes fewer than 10 movements in 2 hours.  You have severe headaches that do not go away when you take medicine.  You have vision changes. Summary  The third trimester is from week 28 through week 40, months 7 through 9. The third trimester is a time when the unborn baby (fetus) is growing rapidly.  During the third trimester, your discomfort may increase as you and your baby continue to gain weight. You may have abdominal, leg, and back pain, sleeping problems, and an increased need to urinate.  During the third trimester your breasts will keep growing and they will continue to become tender. A yellow fluid (colostrum) may leak from your breasts. This is the first milk you are producing for your baby.  False labor is a condition in which you feel small, irregular tightenings of the muscles in the womb (contractions) that eventually go away. These are called Braxton Hicks contractions. Contractions may last for hours, days, or even weeks before true labor sets in.  Signs of labor can include: abdominal cramps; regular contractions that start at 10 minutes apart and become stronger and more frequent with time; watery or bloody mucus discharge that comes from the vagina; increased pelvic pressure and dull back pain; and leaking of amniotic fluid. This information is not intended to replace advice given to you by your  health care provider. Make sure you discuss any questions you have with your health care provider. Document Released: 03/14/2001 Document Revised: 04/25/2016 Document Reviewed: 04/25/2016 Elsevier Interactive Patient Education  2019 [employer]  

## 2018-09-19 ENCOUNTER — Encounter: Payer: BC Managed Care – PPO | Admitting: Advanced Practice Midwife

## 2018-09-20 ENCOUNTER — Encounter: Payer: Self-pay | Admitting: Obstetrics and Gynecology

## 2018-09-20 ENCOUNTER — Ambulatory Visit (INDEPENDENT_AMBULATORY_CARE_PROVIDER_SITE_OTHER): Payer: BC Managed Care – PPO | Admitting: Obstetrics and Gynecology

## 2018-09-20 ENCOUNTER — Other Ambulatory Visit: Payer: Self-pay

## 2018-09-20 VITALS — BP 110/80 | Wt 287.4 lb

## 2018-09-20 DIAGNOSIS — Z23 Encounter for immunization: Secondary | ICD-10-CM

## 2018-09-20 DIAGNOSIS — O0993 Supervision of high risk pregnancy, unspecified, third trimester: Secondary | ICD-10-CM

## 2018-09-20 DIAGNOSIS — O99213 Obesity complicating pregnancy, third trimester: Secondary | ICD-10-CM

## 2018-09-20 DIAGNOSIS — O0990 Supervision of high risk pregnancy, unspecified, unspecified trimester: Secondary | ICD-10-CM

## 2018-09-20 DIAGNOSIS — O10919 Unspecified pre-existing hypertension complicating pregnancy, unspecified trimester: Secondary | ICD-10-CM

## 2018-09-20 DIAGNOSIS — Z6841 Body Mass Index (BMI) 40.0 and over, adult: Secondary | ICD-10-CM

## 2018-09-20 DIAGNOSIS — O99210 Obesity complicating pregnancy, unspecified trimester: Secondary | ICD-10-CM

## 2018-09-20 DIAGNOSIS — O10913 Unspecified pre-existing hypertension complicating pregnancy, third trimester: Secondary | ICD-10-CM

## 2018-09-20 LAB — POCT URINALYSIS DIPSTICK OB
Glucose, UA: NEGATIVE
POC,PROTEIN,UA: NEGATIVE

## 2018-09-20 NOTE — Progress Notes (Signed)
ROB/TDap and BT consent today- BP has been high at home for pt, last time pt checked it was Wed night 160/105, had headache that night Rechecked BP on left arm few mins after and it was 118/70. Tdap today given IM Left Deltoid. Pt tolerated well.

## 2018-09-20 NOTE — Progress Notes (Signed)
Routine Prenatal Care Visit  Subjective  Debra Bender is a 33 y.o. G3P2002 at 7021w2d being seen today for ongoing prenatal care.  She is currently monitored for the following issues for this high-risk pregnancy and has Hypothyroidism; History of thyroid disorder; Gastroesophageal reflux disease without esophagitis; Essential hypertension; Supervision of high risk pregnancy, antepartum; Family history of congenital heart disease; BMI 45.0-49.9, adult (HCC); Obesity affecting pregnancy, antepartum; and Chronic hypertension affecting pregnancy on their problem list.  ----------------------------------------------------------------------------------- Patient reports headache and high BP wednesday. Was normal on repet and headache resolved. .   Contractions: Irregular. Vag. Bleeding: None.  Movement: Present. Denies leaking of fluid.  ----------------------------------------------------------------------------------- The following portions of the patient's history were reviewed and updated as appropriate: allergies, current medications, past family history, past medical history, past social history, past surgical history and problem list. Problem list updated.   Objective  Blood pressure 110/80, weight 287 lb 6.4 oz (130.4 kg), last menstrual period 02/03/2018, currently breastfeeding. Pregravid weight 270 lb (122.5 kg) Total Weight Gain 17 lb 6.4 oz (7.893 kg) Urinalysis:      Fetal Status: Fetal Heart Rate (bpm): 164   Movement: Present     General:  Alert, oriented and cooperative. Patient is in no acute distress.  Skin: Skin is warm and dry. No rash noted.   Cardiovascular: Normal heart rate noted  Respiratory: Normal respiratory effort, no problems with respiration noted  Abdomen: Soft, gravid, appropriate for gestational age. Pain/Pressure: Absent     Pelvic:  Cervical exam deferred        Extremities: Normal range of motion.     Mental Status: Normal mood and affect. Normal  behavior. Normal judgment and thought content.     Assessment   33 y.o. G3P2002 at 6421w2d by  11/27/2018, by Ultrasound presenting for routine prenatal visit  Plan   pregnancy 3 Problems (from 02/03/18 to present)    Problem Noted Resolved   Chronic hypertension affecting pregnancy 07/04/2018 by Conard NovakJackson, Stephen D, MD No   Overview Addendum 07/04/2018  9:49 AM by Conard NovakJackson, Stephen D, MD    [x]  Aspirin 81 mg daily after 12 weeks; discontinue after 36 weeks [x]  baseline labs with CBC, CMP, urine protein/creatinine ratio [ ]  no BP meds unless BPs become elevated [ ]  ultrasound for growth at 28, 32, 36 weeks [x]  Aspirin 81 mg daily after 12 weeks; discontinue after 36 weeks [ ]  Baseline EKG   Current antihypertensives:  amlodipine   Baseline and surveillance labs (pulled [facility], refresh links as needed)  Lab Results  Component Value Date   PLT 304 04/16/2018   CREATININE 0.76 04/16/2018   AST 11 04/16/2018   ALT 11 04/16/2018   PROTCRRATIO 0.09 05/20/2017   PROTEIN24HR 257 (H) 11/24/2016    Antenatal Testing [facility]HT[facility] - O10.919  Group I  BP < 140/90, no preeclampsia, AGA,  nml AFV, +/- meds    Group II BP > 140/90, on meds, no preeclampsia, AGA, nml AFV  20-28-34-38  20-24-28-32-35-38  32//2 x wk  28//BPP wkly then 32//2 x wk  40 no meds; 39 meds  PRN or 37  Pre-eclampsia  GHTN - O13.9/Preeclampsia without severe features  - O14.00   Preeclampsia with severe features - O14.10  Q 3-4wks  Q 2 wks  28//BPP wkly then 32//2 x wk  Inpatient  37  PRN or 34         Obesity affecting pregnancy, antepartum 04/04/2018 by Tresea MallGledhill, Jane, CNM No  Overview Signed 07/04/2018  9:50 AM by Will Bonnet, MD    BMI >=40 [x]  early 1h gtt -  [x]  u/s for dating [x]   [ ]  nutritional goals [ ]  folic acid 1mg  [x]  bASA (>12 weeks) [ ]  consider nutrition consult [ ]  consider maternal EKG 1st trimester [ ]  Growth u/s 28 [ ] , 32 [ ] , 36 weeks [ ]  [ ]  NST/AFI weekly 36+ weeks  (36[] , 37[] , 38[] , 39[] , 40[] ) [ ]  IOL by 41 weeks (scheduled, prn [] ) [ ]  Anesthesia referral p 28 weeks       Family history of congenital heart disease  by Donette Larry No   Overview Signed 11/18/2016  1:11 PM by Dalia Heading, CNM    Seen by genetic counselor. Fetal echo scheduled      Supervision of high risk pregnancy, antepartum 10/12/2016 by Rod Can, CNM No   Overview Addendum 09/20/2018  4:20 PM by Homero Fellers, MD    [facility] Prenatal Labs  Dating 7wk u/s Blood type: B/Positive/-- (07/25 6606) B POS  Genetic Screen    NIPT Diploid XY Antibody:Negative (07/25 0952)neg  Anatomic [place] Incomplete 4/2 Rubella: 1.50 (07/25 0952) Immune Varicella:   immune  GTT Early: normal Third trimester: 107 RPR: Non Reactive (07/25 0952) negative  Rhogam n/a HBsAg: Negative (07/25 0952)   TDaP vaccine  09/20/2018 Flu Shot: 03/07/18 HIV:     Baby Food  Breast                              GBS:   Contraception Considering vasec Pap:10/12/16 NIL  CBB     CS/VBAC NA   Support Person Husband Terry       High risk due to [employer], hx of preeclampsia, morbid obesity, family hx of congenital heart disease, borderline hypothyroidism           Gestational age appropriate obstetric precautions including but not limited to vaginal bleeding, contractions, leaking of fluid and fetal movement were reviewed in detail with the patient.    Will need to start NSTs weekly as 32 weeks.  Continue norvasc 10 for BP control.  Discussed preeclampsia warning signs Patient check BP daily at home. Encouraged her to keep a log on her phone. She knows to call and report any high values > 160/110  Return in about 2 weeks (around 10/04/2018) for ROB/ NST with MD.  Homero Fellers MD [facility] 09/20/2018, 4:25 PM

## 2018-09-25 ENCOUNTER — Encounter
Admission: RE | Admit: 2018-09-25 | Discharge: 2018-09-25 | Disposition: A | Discharge status: Home / Self Care | Payer: BC Managed Care – PPO | Source: Ambulatory Visit | Attending: Anesthesiology | Admitting: Anesthesiology

## 2018-09-25 ENCOUNTER — Other Ambulatory Visit: Payer: Self-pay

## 2018-09-25 NOTE — Consult Note (Signed)
[facility] Anesthesia Consultation  Gomez CleverlyDanielle D Bender XLK:[id number] DOB: 1986-02-05 DOA: 09/25/2018 PCP: Rolm GalaGrandis, Heidi, MD   Requesting physician: Dr. Jerene PitchSchuman Date of consultation: 09/25/18 Reason for consultation: Obesity during pregnancy  CHIEF COMPLAINT:  Obesity during pregnancy  HISTORY OF PRESENT ILLNESS: Debra Bender  is a 33 y.o. female with a known history of obesity during pregnancy. This is her third pregnancy, two prior vaginal deliveries both with epidural for labor analgesia. She has been diagnosed with chronic HTN and is currently taking amlodipine for BP management. Denies any other cardiovascular disease. Denies hx of asthma. Denies personal or family hx of bleeding disorders. Denies personal or family hx of problems with anesthesia.  PAST MEDICAL HISTORY:   Past Medical History:  Diagnosis Date  . Anemia 12/2014  . GERD (gastroesophageal reflux disease)   . Hypertension   . Hypothyroidism   . Obesity affecting pregnancy     PAST SURGICAL HISTORY:  Past Surgical History:  Procedure Laterality Date  . FRACTURE SURGERY Right 33 years old    SOCIAL HISTORY:  Social History   Tobacco Use  . Smoking status: Former Smoker    Quit date: 07/31/2014    Years since quitting: 4.1  . Smokeless tobacco: Never Used  Substance Use Topics  . Alcohol use: No    FAMILY HISTORY:  Family History  Problem Relation Age of Onset  . Depression Mother   . Heart disease Father   . Asthma Brother   . Emphysema Maternal Grandmother   . Diabetes Maternal Grandfather   . Cirrhosis Paternal Grandmother   . Heart disease Paternal Grandfather     DRUG ALLERGIES:  Allergies  Allergen Reactions  . Hydrochlorothiazide Other (See Comments)    Elevated creatinine  . Latex Itching and Rash    REVIEW OF SYSTEMS:   RESPIRATORY: No cough, shortness of breath, wheezing.  CARDIOVASCULAR: No chest pain, orthopnea, edema.  HEMATOLOGY: No  anemia, easy bruising or bleeding SKIN: No rash or lesion. NEUROLOGIC: No tingling, numbness, weakness.  PSYCHIATRY: No anxiety or depression.   MEDICATIONS AT HOME:  Prior to Admission medications   Medication Sig Start Date End Date Taking? Authorizing Provider  Acetaminophen (MAPAP) 500 MG coapsule Take by mouth.    [provider]  amLODipine (NORVASC) 5 MG tablet Take 1 tablet (5 mg total) by mouth 2 (two) times a day. 09/13/18   Oswaldo ConroySchmid, Jacelyn Y, CNM  Butalbital-APAP-Caffeine [id number] MG capsule Take 1-2 capsules by mouth every 6 (six) hours as needed for headache. 03/09/17   Tresea MallGledhill, Jane, CNM  famotidine (PEPCID) 20 MG tablet Take by mouth. 07/30/18 07/30/19  [provider]  folic acid (FOLVITE) 1 MG tablet Take 1 tablet (1 mg total) by mouth daily. 04/04/18   Tresea MallGledhill, Jane, CNM  Prenatal Vit-Fe Fumarate-FA (PRENATAL MULTIVITAMIN) TABS tablet Take 1 tablet by mouth daily at 12 noon.    [provider]  prochlorperazine (COMPAZINE) 10 MG tablet TAKE 1 TABLET (10 MG TOTAL) BY MOUTH EVERY 6 (SIX) HOURS AS NEEDED (HEADACHE). 08/19/18   Oswaldo ConroySchmid, Jacelyn Y, CNM      PHYSICAL EXAMINATION:   VITAL SIGNS: Height 5' 4.5" (1.638 m), weight 131.7 kg, last menstrual period 02/03/2018, currently breastfeeding.  GENERAL:  33 y.o.-year-old patient no acute distress.  HEENT: Head atraumatic, normocephalic. Oropharynx and nasopharynx clear. MP 1, TM distance >3 cm, normal mouth opening. LUNGS: Normal breath sounds bilaterally, no wheezing, rales,rhonchi. No use of accessory muscles of respiration.  CARDIOVASCULAR: S1, S2 normal. No  murmurs, rubs, or gallops.  EXTREMITIES: No pedal edema, cyanosis, or clubbing.  NEUROLOGIC: normal gait PSYCHIATRIC: The patient is alert and oriented x 3.  SKIN: No obvious rash, lesion, or ulcer.    IMPRESSION AND PLAN:   Debra Bender  is a 33 y.o. female presenting with obesity during pregnancy. BMI is currently 49 at [redacted] weeks  gestation. Planning for vaginal delivery and desires epidural for labor analgesia.   Airway exam reassuring. Midline and interspaces palpable.    We discussed analgesic options during labor including epidural analgesia. Discussed that in obesity there can be increased difficulty with epidural placement or even failure of successful epidural. We also discussed that even after successful epidural placement there is increased risk of catheter migration out of the epidural space that would require catheter replacement. Based on description she had this occur with her second delivery. Discussed use of epidural vs spinal vs GA if cesarean delivery is required. Discussed increased risk of difficult intubation during pregnancy should an emergency cesarean delivery be required.   Plan for delivery at [facility].

## 2018-10-01 ENCOUNTER — Other Ambulatory Visit: Payer: Self-pay | Admitting: Obstetrics and Gynecology

## 2018-10-01 DIAGNOSIS — Z3689 Encounter for other specified antenatal screening: Secondary | ICD-10-CM

## 2018-10-03 ENCOUNTER — Other Ambulatory Visit: Payer: Self-pay

## 2018-10-03 ENCOUNTER — Ambulatory Visit (INDEPENDENT_AMBULATORY_CARE_PROVIDER_SITE_OTHER): Payer: BC Managed Care – PPO

## 2018-10-03 ENCOUNTER — Ambulatory Visit (INDEPENDENT_AMBULATORY_CARE_PROVIDER_SITE_OTHER): Payer: BC Managed Care – PPO | Admitting: Obstetrics and Gynecology

## 2018-10-03 ENCOUNTER — Encounter: Payer: Self-pay | Admitting: Obstetrics and Gynecology

## 2018-10-03 VITALS — BP 122/74 | Wt 293.0 lb

## 2018-10-03 DIAGNOSIS — O0990 Supervision of high risk pregnancy, unspecified, unspecified trimester: Secondary | ICD-10-CM

## 2018-10-03 DIAGNOSIS — O10919 Unspecified pre-existing hypertension complicating pregnancy, unspecified trimester: Secondary | ICD-10-CM

## 2018-10-03 DIAGNOSIS — O99210 Obesity complicating pregnancy, unspecified trimester: Secondary | ICD-10-CM

## 2018-10-03 DIAGNOSIS — Z3A32 32 weeks gestation of pregnancy: Secondary | ICD-10-CM | POA: Diagnosis not present

## 2018-10-03 DIAGNOSIS — Z362 Encounter for other antenatal screening follow-up: Secondary | ICD-10-CM

## 2018-10-03 DIAGNOSIS — O99213 Obesity complicating pregnancy, third trimester: Secondary | ICD-10-CM

## 2018-10-03 DIAGNOSIS — O10913 Unspecified pre-existing hypertension complicating pregnancy, third trimester: Secondary | ICD-10-CM

## 2018-10-03 DIAGNOSIS — O0993 Supervision of high risk pregnancy, unspecified, third trimester: Secondary | ICD-10-CM | POA: Diagnosis not present

## 2018-10-03 DIAGNOSIS — Z3689 Encounter for other specified antenatal screening: Secondary | ICD-10-CM

## 2018-10-03 DIAGNOSIS — E039 Hypothyroidism, unspecified: Secondary | ICD-10-CM

## 2018-10-03 DIAGNOSIS — Z6841 Body Mass Index (BMI) 40.0 and over, adult: Secondary | ICD-10-CM

## 2018-10-03 NOTE — Progress Notes (Signed)
Routine Prenatal Care Visit  Subjective  Debra Bender is a 33 y.o. G3P2002 at 1350w1d being seen today for ongoing prenatal care.  She is currently monitored for the following issues for this high-risk pregnancy and has Hypothyroidism; History of thyroid disorder; Gastroesophageal reflux disease without esophagitis; Essential hypertension; Supervision of high risk pregnancy, antepartum; Family history of congenital heart disease; BMI 45.0-49.9, adult (HCC); Obesity affecting pregnancy, antepartum; and Chronic hypertension affecting pregnancy on their problem list.  ----------------------------------------------------------------------------------- Patient reports no complaints.   Contractions: Not present. Vag. Bleeding: None.  Movement: Present. Denies leaking of fluid.  Denies HA, visual changes, and RUQ pain.  BP log at home she states is normal. Growth u/s today shows 78th %ile (large head and AC measurements).  AFI nml. ----------------------------------------------------------------------------------- The following portions of the patient's history were reviewed and updated as appropriate: allergies, current medications, past family history, past medical history, past social history, past surgical history and problem list. Problem list updated.   Objective  Blood pressure 122/74, weight 293 lb (132.9 kg), last menstrual period 02/03/2018, currently breastfeeding. Pregravid weight 270 lb (122.5 kg) Total Weight Gain 23 lb (10.4 kg) Urinalysis: Urine Protein    Urine Glucose    Fetal Status: Fetal Heart Rate (bpm): 135   Movement: Present  Presentation: Vertex  General:  Alert, oriented and cooperative. Patient is in no acute distress.  Skin: Skin is warm and dry. No rash noted.   Cardiovascular: Normal heart rate noted  Respiratory: Normal respiratory effort, no problems with respiration noted  Abdomen: Soft, gravid, appropriate for gestational age. Pain/Pressure: Absent      Pelvic:  Cervical exam deferred        Extremities: Normal range of motion.     Mental Status: Normal mood and affect. Normal behavior. Normal judgment and thought content.   NST: Baseline FHR: 135 beats/min Variability: moderate Accelerations: present Decelerations: absent Tocometry: not done  Interpretation:  INDICATIONS: chronic hypertension RESULTS:  A NST procedure was performed with FHR monitoring and a normal baseline established, appropriate time of 20-40 minutes of evaluation, and accels >2 seen w 15x15 characteristics.  Results show a REACTIVE NST.    Imaging Results [place] Ob Follow Up  Result Date: 10/03/2018 Patient Name: Debra Bender DOB: Jun 28, 1985 MRN: [id number] ULTRASOUND REPORT Location: [facility] Date of Service: 10/03/2018 Indications:growth/afi Findings: Mason JimSingleton intrauterine pregnancy is visualized with FHR at 133 BPM. Biometrics give an (U/S) Gestational age of 3347w6d and an (U/S) EDD of 11/08/2018; this correlates with the clinically established Estimated Date of Delivery: 11/27/2018. Fetal presentation is Cephalic. Placenta: anterior. Grade: 1 AFI: 11.5 cm Growth percentile is 78.8%. EFW: 2,424 g  (5 lb 6 oz) Impression: 1. 2850w1d Viable Singleton Intrauterine pregnancy previously established criteria. 2. Growth is 78.8 %ile.  AFI is 11.5 cm. Recommendations: 1.Clinical correlation with the patient's History and Physical Exam. 2. Follow up growth u/s in 3-4 weeks Deanna ArtisElyse S Fairbanks, RT The ultrasound images and findings were reviewed by me and I agree with the above report. Thomasene MohairStephen Kerrie Latour, MD, Merlinda FrederickFACOG [facility], [facility] 10/03/2018 3:31 PM       Assessment   33 y.o. [id number] at 5050w1d by  11/27/2018, by Ultrasound presenting for routine prenatal visit  Plan   pregnancy 3 Problems (from 02/03/18 to present)    Problem Noted Resolved   Chronic hypertension affecting pregnancy 07/04/2018 by Conard NovakJackson, Meggie Laseter D, MD No   Overview Addendum 07/04/2018   9:49 AM by Conard NovakJackson, Shataria Crist D, MD    [  x] Aspirin 81 mg daily after 12 weeks; discontinue after 36 weeks [x]  baseline labs with CBC, CMP, urine protein/creatinine ratio [ ]  no BP meds unless BPs become elevated [ ]  ultrasound for growth at 28, 32, 36 weeks [x]  Aspirin 81 mg daily after 12 weeks; discontinue after 36 weeks [ ]  Baseline EKG   Current antihypertensives:  amlodipine   Baseline and surveillance labs (pulled in from [facility], refresh links as needed)  Lab Results  Component Value Date   PLT 304 04/16/2018   CREATININE 0.76 04/16/2018   AST 11 04/16/2018   ALT 11 04/16/2018   PROTCRRATIO 0.09 05/20/2017   PROTEIN24HR 257 (H) 11/24/2016    Antenatal Testing CHTN - O10.919  Group I  BP < 140/90, no preeclampsia, AGA,  nml AFV, +/- meds    Group II BP > 140/90, on meds, no preeclampsia, AGA, nml AFV  20-28-34-38  20-24-28-32-35-38  32//2 x wk  28//BPP wkly then 32//2 x wk  40 no meds; 39 meds  PRN or 37  Pre-eclampsia  GHTN - O13.9/Preeclampsia without severe features  - O14.00   Preeclampsia with severe features - O14.10  Q 3-4wks  Q 2 wks  28//BPP wkly then 32//2 x wk  Inpatient  37  PRN or 34         Obesity affecting pregnancy, antepartum 04/04/2018 by Rod Can, CNM No   Overview Signed 07/04/2018  9:50 AM by Will Bonnet, MD    BMI >=40 [x]  early 1h gtt -  [x]  u/s for dating [x]   [ ]  nutritional goals [ ]  folic acid 1mg  [x]  bASA (>12 weeks) [ ]  consider nutrition consult [ ]  consider maternal EKG 1st trimester [ ]  Growth u/s 28 [ ] , 32 [ ] , 36 weeks [ ]  [ ]  NST/AFI weekly 36+ weeks (36[] , 37[] , 38[] , 39[] , 40[] ) [ ]  IOL by 41 weeks (scheduled, prn [] ) [ ]  Anesthesia referral p 28 weeks       Family history of congenital heart disease  by Donette Larry No   Overview Signed 11/18/2016  1:11 PM by Dalia Heading, CNM    Seen by genetic counselor. Fetal echo scheduled      Supervision of high risk pregnancy, antepartum 10/12/2016  by Rod Can, CNM No   Overview Addendum 09/20/2018  4:20 PM by Homero Fellers, MD    Clinic [facility] Prenatal Labs  Dating 7wk u/s Blood type: B/Positive/-- (07/25 1610) B POS  Genetic Screen    NIPT Diploid XY Antibody:Negative (07/25 0952)neg  Anatomic [place] Incomplete 4/2 Rubella: 1.50 (07/25 0952) Immune Varicella:   immune  GTT Early: normal Third trimester: 107 RPR: Non Reactive (07/25 0952) negative  Rhogam n/a HBsAg: Negative (07/25 0952)   TDaP vaccine  09/20/2018 Flu Shot: 03/07/18 HIV:     Baby Food  Breast                              GBS:   Contraception Considering vasec Pap:10/12/16 NIL  CBB     CS/VBAC NA   Support Person Husband Terry       High risk due to [employer], hx of preeclampsia, morbid obesity, family hx of congenital heart disease, borderline hypothyroidism           Preterm labor symptoms and general obstetric precautions including but not limited to vaginal bleeding, contractions, leaking of fluid and fetal movement were reviewed in detail with the patient. Please refer  to After Visit Summary for other counseling recommendations.   Return in about 1 week (around 10/10/2018) for Routine Prenatal Appointment/NST.  Thomasene MohairStephen Lemon Sternberg, MD, Merlinda FrederickFACOG [facility], [facility] 10/03/2018 3:45 PM

## 2018-10-11 ENCOUNTER — Other Ambulatory Visit: Payer: Self-pay

## 2018-10-11 ENCOUNTER — Ambulatory Visit (INDEPENDENT_AMBULATORY_CARE_PROVIDER_SITE_OTHER): Payer: BC Managed Care – PPO | Admitting: Advanced Practice Midwife

## 2018-10-11 ENCOUNTER — Encounter: Payer: Self-pay | Admitting: Advanced Practice Midwife

## 2018-10-11 VITALS — BP 132/82 | Wt 295.0 lb

## 2018-10-11 DIAGNOSIS — O0993 Supervision of high risk pregnancy, unspecified, third trimester: Secondary | ICD-10-CM

## 2018-10-11 DIAGNOSIS — O10913 Unspecified pre-existing hypertension complicating pregnancy, third trimester: Secondary | ICD-10-CM

## 2018-10-11 DIAGNOSIS — Z3A33 33 weeks gestation of pregnancy: Secondary | ICD-10-CM

## 2018-10-11 DIAGNOSIS — O0990 Supervision of high risk pregnancy, unspecified, unspecified trimester: Secondary | ICD-10-CM

## 2018-10-11 LAB — POCT URINALYSIS DIPSTICK OB: Glucose, UA: NEGATIVE

## 2018-10-11 NOTE — Progress Notes (Signed)
ROB NST 

## 2018-10-11 NOTE — Progress Notes (Signed)
Routine Prenatal Care Visit  Subjective  Debra Bender is a 33 y.o. G3P2002 at [redacted]w[redacted]d being seen today for ongoing prenatal care.  She is currently monitored for the following issues for this high-risk pregnancy and has Hypothyroidism; History of thyroid disorder; Gastroesophageal reflux disease without esophagitis; Essential hypertension; Supervision of high risk pregnancy, antepartum; Family history of congenital heart disease; BMI 45.0-49.9, adult ([place]); Obesity affecting pregnancy, antepartum; and Chronic hypertension affecting pregnancy on their problem list.  ----------------------------------------------------------------------------------- Patient reports no complaints.  She denies headaches, visual changes, epigastric pain. She is taking her Norvasc as prescribed. Contractions: Not present. Vag. Bleeding: None.  Movement: Present. Denies leaking of fluid.  ----------------------------------------------------------------------------------- The following portions of the patient's history were reviewed and updated as appropriate: allergies, current medications, past family history, past medical history, past social history, past surgical history and problem list. Problem list updated.   Objective  Blood pressure 132/82, weight 295 lb (133.8 kg), last menstrual period 02/03/2018, currently breastfeeding. Pregravid weight 270 lb (122.5 kg) Total Weight Gain 25 lb (11.3 kg) Urinalysis: Urine Protein Trace  Urine Glucose Negative  Fetal Status: Fetal Heart Rate (bpm): 130   Movement: Present     NST today: 20 minute tracing is reactive with 130 bpm baseline, moderate variability, +accelerations, -decelerations,  General:  Alert, oriented and cooperative. Patient is in no acute distress.  Skin: Skin is warm and dry. No rash noted.   Cardiovascular: Normal heart rate noted  Respiratory: Normal respiratory effort, no problems with respiration noted  Abdomen: Soft, gravid, appropriate for  gestational age. Pain/Pressure: Absent     Pelvic:  Cervical exam deferred        Extremities: Normal range of motion.     Mental Status: Normal mood and affect. Normal behavior. Normal judgment and thought content.   Assessment   33 y.o. G3P2002 at [redacted]w[redacted]d by  11/27/2018, by Ultrasound presenting for routine prenatal visit  Plan   pregnancy 3 Problems (from 02/03/18 to present)    Problem Noted Resolved   Chronic hypertension affecting pregnancy 07/04/2018 by Will Bonnet, MD No   Overview Addendum 07/04/2018  9:49 AM by Will Bonnet, MD    [x]  Aspirin 81 mg daily after 12 weeks; discontinue after 36 weeks [x]  baseline labs with CBC, CMP, urine protein/creatinine ratio [ ]  no BP meds unless BPs become elevated [ ]  ultrasound for growth at 28, 32, 36 weeks [x]  Aspirin 81 mg daily after 12 weeks; discontinue after 36 weeks [ ]  Baseline EKG   Current antihypertensives:  amlodipine   Baseline and surveillance labs (pulled in from [facility], refresh links as needed)  Lab Results  Component Value Date   PLT 304 04/16/2018   CREATININE 0.76 04/16/2018   AST 11 04/16/2018   ALT 11 04/16/2018   PROTCRRATIO 0.09 05/20/2017   PROTEIN24HR 257 (H) 11/24/2016    Antenatal Testing CHTN - O10.919  Group I  BP < 140/90, no preeclampsia, AGA,  nml AFV, +/- meds    Group II BP > 140/90, on meds, no preeclampsia, AGA, nml AFV  20-28-34-38  20-24-28-32-35-38  32//2 x wk  28//BPP wkly then 32//2 x wk  40 no meds; 39 meds  PRN or 37  Pre-eclampsia  GHTN - O13.9/Preeclampsia without severe features  - O14.00   Preeclampsia with severe features - O14.10  Q 3-4wks  Q 2 wks  28//BPP wkly then 32//2 x wk  Inpatient  37  PRN or 34  Obesity affecting pregnancy, antepartum 04/04/2018 by Tresea MallGledhill, Lesleyann Fichter, CNM No   Overview Signed 07/04/2018  9:50 AM by Conard NovakJackson, Stephen D, MD    BMI >=40 [x]  early 1h gtt -  [x]  u/s for dating [x]   [ ]  nutritional goals [ ]  folic acid 1mg  [x]   bASA (>12 weeks) [ ]  consider nutrition consult [ ]  consider maternal EKG 1st trimester [ ]  Growth u/s 28 [ ] , 32 [ ] , 36 weeks [ ]  [ ]  NST/AFI weekly 36+ weeks (36[] , 37[] , 38[] , 39[] , 40[] ) [ ]  IOL by 41 weeks (scheduled, prn [] ) [ ]  Anesthesia referral p 28 weeks       Family history of congenital heart disease  by Katrina StackWells, Deborah No   Overview Signed 11/18/2016  1:11 PM by Farrel ConnersGutierrez, Colleen, CNM    Seen by genetic counselor. Fetal echo scheduled      Supervision of high risk pregnancy, antepartum 10/12/2016 by Tresea MallGledhill, Journey Castonguay, CNM No   Overview Addendum 09/20/2018  4:20 PM by Natale MilchSchuman, Christanna R, MD    Clinic [facility] Prenatal Labs  Dating 7wk u/s Blood type: B/Positive/-- (07/25 40980952) B POS  Genetic Screen    NIPT Diploid XY Antibody:Negative (07/25 0952)neg  Anatomic US Incomplete 4/2 Rubella: 1.50 (07/25 0952) Immune Varicella:   immune  GTT Early: normal Third trimester: 107 RPR: Non Reactive (07/25 0952) negative  Rhogam n/a HBsAg: Negative (07/25 0952)   TDaP vaccine  09/20/2018 Flu Shot: 03/07/18 HIV:     Baby Food  Breast                              GBS:   Contraception Considering vasec Pap:10/12/16 NIL  CBB     CS/VBAC NA   Support Person Husband Terry       High risk due to [employer], hx of preeclampsia, morbid obesity, family hx of congenital heart disease, borderline hypothyroidism           Preterm labor symptoms and general obstetric precautions including but not limited to vaginal bleeding, contractions, leaking of fluid and fetal movement were reviewed in detail with the patient. Please refer to After Visit Summary for other counseling recommendations.   Return in about 1 week (around 10/18/2018) for nst/rob.  Tresea MallJane Jarome Trull, CNM 10/11/2018 4:15 PM

## 2018-10-15 ENCOUNTER — Ambulatory Visit (INDEPENDENT_AMBULATORY_CARE_PROVIDER_SITE_OTHER): Payer: BC Managed Care – PPO | Admitting: Obstetrics and Gynecology

## 2018-10-15 ENCOUNTER — Encounter: Payer: Self-pay | Admitting: Obstetrics and Gynecology

## 2018-10-15 ENCOUNTER — Other Ambulatory Visit: Payer: Self-pay

## 2018-10-15 VITALS — BP 136/84 | Wt 294.0 lb

## 2018-10-15 DIAGNOSIS — O10919 Unspecified pre-existing hypertension complicating pregnancy, unspecified trimester: Secondary | ICD-10-CM

## 2018-10-15 DIAGNOSIS — O99213 Obesity complicating pregnancy, third trimester: Secondary | ICD-10-CM

## 2018-10-15 DIAGNOSIS — Z3A33 33 weeks gestation of pregnancy: Secondary | ICD-10-CM

## 2018-10-15 DIAGNOSIS — E039 Hypothyroidism, unspecified: Secondary | ICD-10-CM

## 2018-10-15 DIAGNOSIS — O10913 Unspecified pre-existing hypertension complicating pregnancy, third trimester: Secondary | ICD-10-CM | POA: Diagnosis not present

## 2018-10-15 DIAGNOSIS — O99210 Obesity complicating pregnancy, unspecified trimester: Secondary | ICD-10-CM

## 2018-10-15 DIAGNOSIS — Z6841 Body Mass Index (BMI) 40.0 and over, adult: Secondary | ICD-10-CM

## 2018-10-15 DIAGNOSIS — O0990 Supervision of high risk pregnancy, unspecified, unspecified trimester: Secondary | ICD-10-CM

## 2018-10-15 DIAGNOSIS — O0993 Supervision of high risk pregnancy, unspecified, third trimester: Secondary | ICD-10-CM

## 2018-10-15 NOTE — Progress Notes (Signed)
Routine Prenatal Care Visit  Subjective  Debra Bender is a 33 y.o. G3P2002 at 2448w6d being seen today for ongoing prenatal care.  She is currently monitored for the following issues for this high-risk pregnancy and has Hypothyroidism; History of thyroid disorder; Gastroesophageal reflux disease without esophagitis; Essential hypertension; Supervision of high risk pregnancy, antepartum; Family history of congenital heart disease; BMI 45.0-49.9, adult (HCC); Obesity affecting pregnancy, antepartum; and Chronic hypertension affecting pregnancy on their problem list.  ----------------------------------------------------------------------------------- Patient reports no complaints.   Contractions: Not present. Vag. Bleeding: None.  Movement: Present. Denies leaking of fluid.  Denies HA, visual changes and RUQ pain ----------------------------------------------------------------------------------- The following portions of the patient's history were reviewed and updated as appropriate: allergies, current medications, past family history, past medical history, past social history, past surgical history and problem list. Problem list updated.   Objective  Blood pressure 136/84, weight 294 lb (133.4 kg), last menstrual period 02/03/2018, currently breastfeeding. Pregravid weight 270 lb (122.5 kg) Total Weight Gain 24 lb (10.9 kg) Urinalysis: Urine Protein    Urine Glucose    Fetal Status: Fetal Heart Rate (bpm): 130   Movement: Present     General:  Alert, oriented and cooperative. Patient is in no acute distress.  Skin: Skin is warm and dry. No rash noted.   Cardiovascular: Normal heart rate noted  Respiratory: Normal respiratory effort, no problems with respiration noted  Abdomen: Soft, gravid, appropriate for gestational age. Pain/Pressure: Absent     Pelvic:  Cervical exam deferred        Extremities: Normal range of motion.  Edema: None  Mental Status: Normal mood and affect. Normal  behavior. Normal judgment and thought content.   NST: Baseline FHR: 130 beats/min Variability: moderate Accelerations: present Decelerations: absent Tocometry: not done  Interpretation:  INDICATIONS: chronic hypertension RESULTS:  A NST procedure was performed with FHR monitoring and a normal baseline established, appropriate time of 20-40 minutes of evaluation, and accels >2 seen w 15x15 characteristics.  Results show a REACTIVE NST.    Assessment   33 y.o. [id number] at 1848w6d by  11/27/2018, by Ultrasound presenting for routine prenatal visit  Plan   pregnancy 3 Problems (from 02/03/18 to present)    Problem Noted Resolved   Chronic hypertension affecting pregnancy 07/04/2018 by Conard NovakJackson, Stephen D, MD No   Overview Addendum 07/04/2018  9:49 AM by Conard NovakJackson, Stephen D, MD    [x]  Aspirin 81 mg daily after 12 weeks; discontinue after 36 weeks [x]  baseline labs with CBC, CMP, urine protein/creatinine ratio [ ]  no BP meds unless BPs become elevated [ ]  ultrasound for growth at 28, 32, 36 weeks [x]  Aspirin 81 mg daily after 12 weeks; discontinue after 36 weeks [ ]  Baseline EKG   Current antihypertensives:  amlodipine   Baseline and surveillance labs [facility], refresh links as needed)  Lab Results  Component Value Date   PLT 304 04/16/2018   CREATININE 0.76 04/16/2018   AST 11 04/16/2018   ALT 11 04/16/2018   PROTCRRATIO 0.09 05/20/2017   PROTEIN24HR 257 (H) 11/24/2016    Antenatal Testing CHTN - O10.919  Group I  BP < 140/90, no preeclampsia, AGA,  nml AFV, +/- meds    Group II BP > 140/90, on meds, no preeclampsia, AGA, nml AFV  20-28-34-38  20-24-28-32-35-38  32//2 x wk  28//BPP wkly then 32//2 x wk  40 no meds; 39 meds  PRN or 37  Pre-eclampsia  GHTN - O13.9/Preeclampsia without severe features  -  O14.00   Preeclampsia with severe features - O14.10  Q 3-4wks  Q 2 wks  28//BPP wkly then 32//2 x wk  Inpatient  37  PRN or 34         Obesity  affecting pregnancy, antepartum 04/04/2018 by Rod Can, CNM No   Overview Signed 07/04/2018  9:50 AM by Will Bonnet, MD    BMI >=40 [x]  early 1h gtt -  [x]  u/s for dating [x]   [ ]  nutritional goals [ ]  folic acid 1mg  [x]  bASA (>12 weeks) [ ]  consider nutrition consult [ ]  consider maternal EKG 1st trimester [ ]  Growth u/s 28 [ ] , 32 [ ] , 36 weeks [ ]  [ ]  NST/AFI weekly 36+ weeks (36[] , 37[] , 38[] , 39[] , 40[] ) [ ]  IOL by 41 weeks (scheduled, prn [] ) [ ]  Anesthesia referral p 28 weeks       Family history of congenital heart disease  by Donette Larry No   Overview Signed 11/18/2016  1:11 PM by Dalia Heading, CNM    Seen by genetic counselor. Fetal echo scheduled      Supervision of high risk pregnancy, antepartum 10/12/2016 by Rod Can, CNM No   Overview Addendum 09/20/2018  4:20 PM by Homero Fellers, MD    Clinic [facility] Prenatal Labs  Dating 7wk u/s Blood type: B/Positive/-- (07/25 5027) B POS  Genetic Screen    NIPT Diploid XY Antibody:Negative (07/25 0952)neg  Anatomic [place] Incomplete 4/2 Rubella: 1.50 (07/25 0952) Immune Varicella:   immune  GTT Early: normal Third trimester: 107 RPR: Non Reactive (07/25 0952) negative  Rhogam n/a HBsAg: Negative (07/25 0952)   TDaP vaccine  09/20/2018 Flu Shot: 03/07/18 HIV:     Baby Food  Breast                              GBS:   Contraception Considering vasec Pap:10/12/16 NIL  CBB     CS/VBAC NA   Support Person Husband Terry       High risk due to [employer], hx of preeclampsia, morbid obesity, family hx of congenital heart disease, borderline hypothyroidism           Preterm labor symptoms and general obstetric precautions including but not limited to vaginal bleeding, contractions, leaking of fluid and fetal movement were reviewed in detail with the patient. Please refer to After Visit Summary for other counseling recommendations.   Return in about 3 days (around 10/18/2018) for ROB/NST.  Twice weekly  NST Growth ~2 weeks Start twice weekly NST with weekly AFI at 36 weeks   Prentice Docker, MD, [place] 10/15/2018 11:12 AM

## 2018-10-18 ENCOUNTER — Other Ambulatory Visit: Payer: Self-pay

## 2018-10-18 ENCOUNTER — Ambulatory Visit (INDEPENDENT_AMBULATORY_CARE_PROVIDER_SITE_OTHER): Payer: BC Managed Care – PPO | Admitting: Obstetrics and Gynecology

## 2018-10-18 VITALS — BP 118/72 | Wt 295.0 lb

## 2018-10-18 DIAGNOSIS — O0993 Supervision of high risk pregnancy, unspecified, third trimester: Secondary | ICD-10-CM

## 2018-10-18 DIAGNOSIS — O0990 Supervision of high risk pregnancy, unspecified, unspecified trimester: Secondary | ICD-10-CM

## 2018-10-18 DIAGNOSIS — O10919 Unspecified pre-existing hypertension complicating pregnancy, unspecified trimester: Secondary | ICD-10-CM

## 2018-10-18 DIAGNOSIS — O99213 Obesity complicating pregnancy, third trimester: Secondary | ICD-10-CM

## 2018-10-18 DIAGNOSIS — O99210 Obesity complicating pregnancy, unspecified trimester: Secondary | ICD-10-CM

## 2018-10-18 DIAGNOSIS — O10913 Unspecified pre-existing hypertension complicating pregnancy, third trimester: Secondary | ICD-10-CM | POA: Diagnosis not present

## 2018-10-18 DIAGNOSIS — Z3A34 34 weeks gestation of pregnancy: Secondary | ICD-10-CM

## 2018-10-18 LAB — POCT URINALYSIS DIPSTICK OB: Glucose, UA: NEGATIVE

## 2018-10-18 NOTE — Progress Notes (Signed)
ROB NST 

## 2018-10-18 NOTE — Progress Notes (Signed)
Routine Prenatal Care Visit  Subjective  Debra Bender is a 33 y.o. G3P2002 at 9673w2d being seen today for ongoing prenatal care.  She is currently monitored for the following issues for this high-risk pregnancy and has Hypothyroidism; History of thyroid disorder; Gastroesophageal reflux disease without esophagitis; Essential hypertension; Supervision of high risk pregnancy, antepartum; Family history of congenital heart disease; BMI 45.0-49.9, adult (HCC); Obesity affecting pregnancy, antepartum; and Chronic hypertension affecting pregnancy on their problem list.  ----------------------------------------------------------------------------------- Patient reports no complaints.   Contractions: Not present. Vag. Bleeding: None.  Movement: Present. Denies leaking of fluid.  ----------------------------------------------------------------------------------- The following portions of the patient's history were reviewed and updated as appropriate: allergies, current medications, past family history, past medical history, past social history, past surgical history and problem list. Problem list updated.   Objective  Blood pressure 118/72, weight 295 lb (133.8 kg), last menstrual period 02/03/2018, currently breastfeeding. Pregravid weight 270 lb (122.5 kg) Total Weight Gain 25 lb (11.3 kg) Urinalysis:      Fetal Status: Fetal Heart Rate (bpm): 135   Movement: Present  Presentation: Vertex  General:  Alert, oriented and cooperative. Patient is in no acute distress.  Skin: Skin is warm and dry. No rash noted.   Cardiovascular: Normal heart rate noted  Respiratory: Normal respiratory effort, no problems with respiration noted  Abdomen: Soft, gravid, appropriate for gestational age. Pain/Pressure: Absent     Pelvic:  Cervical exam deferred        Extremities: Normal range of motion.     ental Status: Normal mood and affect. Normal behavior. Normal judgment and thought content.   [place] Ob  Follow Up  Result Date: 10/03/2018 Patient Name: Debra Bender DOB: [birth year] MRN: [id number] ULTRASOUND REPORT Location: [facility] Date of Service: 10/03/2018 Indications:growth/afi Findings: Mason JimSingleton intrauterine pregnancy is visualized with FHR at 133 BPM. Biometrics give an (U/S) Gestational age of 3178w6d and an (U/S) EDD of 11/08/2018; this correlates with the clinically established Estimated Date of Delivery: 11/27/2018. Fetal presentation is Cephalic. Placenta: anterior. Grade: 1 AFI: 11.5 cm Growth percentile is 78.8%. EFW: 2,424 g  (5 lb 6 oz) Impression: 1. 753w1d Viable Singleton Intrauterine pregnancy previously established criteria. 2. Growth is 78.8 %ile.  AFI is 11.5 cm. Recommendations: 1.Clinical correlation with the patient's History and Physical Exam. 2. Follow up growth u/s in 3-4 weeks Deanna ArtisElyse S Fairbanks, RT The ultrasound images and findings were reviewed by me and I agree with the above report. Thomasene MohairStephen Jackson, MD, FACOG [facility], [facility] 10/03/2018 3:31 PM     Baseline: 135 Variability: moderate Accelerations: present Decelerations: absent The patient was monitored for 30 minutes, fetal heart rate tracing was deemed reactive, category I tracing.  Assessment   33 y.o. [id number] at 4573w2d by  11/27/2018, by Ultrasound presenting for routine prenatal visit  Plan   pregnancy 3 Problems (from 02/03/18 to present)    Problem Noted Resolved   Chronic hypertension affecting pregnancy 07/04/2018 by Conard NovakJackson, Stephen D, MD No   Overview Addendum 07/04/2018  9:49 AM by Conard NovakJackson, Stephen D, MD    [x]  Aspirin 81 mg daily after 12 weeks; discontinue after 36 weeks [x]  baseline labs with CBC, CMP, urine protein/creatinine ratio [ ]  no BP meds unless BPs become elevated [ ]  ultrasound for growth at 28, 32, 36 weeks [x]  Aspirin 81 mg daily after 12 weeks; discontinue after 36 weeks [ ]  Baseline EKG   Current antihypertensives:  amlodipine   Baseline and  surveillance  labs (pulled in from [facility], refresh links as needed)  Lab Results  Component Value Date   PLT 304 04/16/2018   CREATININE 0.76 04/16/2018   AST 11 04/16/2018   ALT 11 04/16/2018   PROTCRRATIO 0.09 05/20/2017   PROTEIN24HR 257 (H) 11/24/2016    Antenatal Testing CHTN - O10.919  Group I  BP < 140/90, no preeclampsia, AGA,  nml AFV, +/- meds    Group II BP > 140/90, on meds, no preeclampsia, AGA, nml AFV  20-28-34-38  20-24-28-32-35-38  32//2 x wk  28//BPP wkly then 32//2 x wk  40 no meds; 39 meds  PRN or 37  Pre-eclampsia  GHTN - O13.9/Preeclampsia without severe features  - O14.00   Preeclampsia with severe features - O14.10  Q 3-4wks  Q 2 wks  28//BPP wkly then 32//2 x wk  Inpatient  37  PRN or 34         Obesity affecting pregnancy, antepartum 04/04/2018 by Rod Can, CNM No   Overview Signed 07/04/2018  9:50 AM by Will Bonnet, MD    BMI >=40 [x]  early 1h gtt -  [x]  u/s for dating [x]   [ ]  nutritional goals [ ]  folic acid 1mg  [x]  bASA (>12 weeks) [ ]  consider nutrition consult [ ]  consider maternal EKG 1st trimester [ ]  Growth u/s 28 [ ] , 32 [ ] , 36 weeks [ ]  [ ]  NST/AFI weekly 36+ weeks (36[] , 37[] , 38[] , 39[] , 40[] ) [ ]  IOL by 41 weeks (scheduled, prn [] ) [ ]  Anesthesia referral p 28 weeks       Family history of congenital heart disease  by Donette Larry No   Overview Signed 11/18/2016  1:11 PM by Dalia Heading, CNM    Seen by genetic counselor. Fetal echo scheduled      Supervision of high risk pregnancy, antepartum 10/12/2016 by Rod Can, CNM No   Overview Addendum 09/20/2018  4:20 PM by Homero Fellers, MD    Clinic [facility] Prenatal Labs  Dating 7wk u/s Blood type: B/Positive/-- (07/25 6270) B POS  Genetic Screen    NIPT Diploid XY Antibody:Negative (07/25 0952)neg  Anatomic [place] Incomplete 4/2 Rubella: 1.50 (07/25 0952) Immune Varicella:   immune  GTT Early: normal Third trimester: 107 RPR: Non Reactive  (07/25 0952) negative  Rhogam n/a HBsAg: Negative (07/25 0952)   TDaP vaccine  09/20/2018 Flu Shot: 03/07/18 HIV:     Baby Food  Breast                              GBS:   Contraception Considering vasec Pap:10/12/16 NIL  CBB     CS/VBAC NA   Support Person Husband Terry       High risk due to [employer], hx of preeclampsia, morbid obesity, family hx of congenital heart disease, borderline hypothyroidism           Gestational age appropriate obstetric precautions including but not limited to vaginal bleeding, contractions, leaking of fluid and fetal movement were reviewed in detail with the patient.    Return in 4 days (on 10/22/2018) for ([place], NST 7/21), (ROB, NST AFI 7/24).  Malachy Mood, MD, [facility], [facility] 10/18/2018, 9:31 PM

## 2018-10-21 ENCOUNTER — Telehealth: Payer: Self-pay

## 2018-10-21 NOTE — Telephone Encounter (Signed)
FMLA/DISABILITY form for Debra Bender filled out, signature obtained and given to KT for processing.

## 2018-10-22 ENCOUNTER — Encounter: Payer: Self-pay | Admitting: Obstetrics & Gynecology

## 2018-10-22 ENCOUNTER — Other Ambulatory Visit: Payer: Self-pay

## 2018-10-22 ENCOUNTER — Ambulatory Visit (INDEPENDENT_AMBULATORY_CARE_PROVIDER_SITE_OTHER): Payer: BC Managed Care – PPO | Admitting: Obstetrics & Gynecology

## 2018-10-22 VITALS — BP 120/70 | Wt 295.0 lb

## 2018-10-22 DIAGNOSIS — O10913 Unspecified pre-existing hypertension complicating pregnancy, third trimester: Secondary | ICD-10-CM

## 2018-10-22 DIAGNOSIS — E039 Hypothyroidism, unspecified: Secondary | ICD-10-CM

## 2018-10-22 DIAGNOSIS — Z3A34 34 weeks gestation of pregnancy: Secondary | ICD-10-CM

## 2018-10-22 DIAGNOSIS — O0990 Supervision of high risk pregnancy, unspecified, unspecified trimester: Secondary | ICD-10-CM

## 2018-10-22 DIAGNOSIS — Z6841 Body Mass Index (BMI) 40.0 and over, adult: Secondary | ICD-10-CM

## 2018-10-22 DIAGNOSIS — O99283 Endocrine, nutritional and metabolic diseases complicating pregnancy, third trimester: Secondary | ICD-10-CM | POA: Diagnosis not present

## 2018-10-22 DIAGNOSIS — O10919 Unspecified pre-existing hypertension complicating pregnancy, unspecified trimester: Secondary | ICD-10-CM

## 2018-10-22 NOTE — Progress Notes (Signed)
  Subjective  Fetal Movement? yes Contractions? no Leaking Fluid? no Vaginal Bleeding? no No s/sx preeclampsia Objective  BP 120/70   Wt 295 lb (133.8 kg)   LMP 02/03/2018   BMI 49.85 kg/m  General: NAD Pumonary: no increased work of breathing Abdomen: gravid, non-tender Extremities: no edema Psychiatric: mood appropriate, affect full  A NST procedure was performed with FHR monitoring and a normal baseline established, appropriate time of 20-40 minutes of evaluation, and accels >2 seen w 15x15 characteristics.  Results show a REACTIVE NST.   Assessment  33 y.o. [id number] at [redacted]w[redacted]d by  11/27/2018, by Ultrasound presenting for routine prenatal visit  Plan   Problem List Items Addressed This Visit    Chronic hypertension affecting pregnancy   Relevant Orders   US OB Limited for AFI on Friday NST twice weekly No meds, well controlled at this time Risk factors for preeclampsia discussed   Hypothyroidism     meds   Supervision of high risk pregnancy, antepartum        PNV, [place], Labor precautions discussed  BMI 45.0-49.9, adult ([place])   Relevant Orders   US OB Limited for AFI   [redacted] weeks gestation of pregnancy          Overview Addendum 09/20/2018  4:20 PM by Homero Fellers, MD    Clinic [facility] Prenatal Labs  Dating 7wk u/s Blood type: B/Positive/-- (07/25 0952) B POS  Genetic Screen    NIPT Diploid XY Antibody:Negative (07/25 0952)neg  Anatomic [place] Incomplete 4/2 Rubella: 1.50 (07/25 0952) Immune Varicella:   immune  GTT Early: normal Third trimester: 107 RPR: Non Reactive (07/25 0952) negative  Rhogam n/a HBsAg: Negative (07/25 0952)   TDaP vaccine  09/20/2018 Flu Shot: 03/07/18 HIV:   neg  Baby Food  Breast                              GBS: p  Contraception Considering Depo Pap:10/12/16 NIL  CBB  no   CS/VBAC NA   Support Person Husband Terry       High risk due to [place], hx of preeclampsia, morbid obesity, family hx of congenital heart disease, borderline  hypothyroidism           Barnett Applebaum, MD, [facility], [facility] 10/22/2018  11:05 AM

## 2018-10-25 ENCOUNTER — Encounter: Payer: Self-pay | Admitting: Obstetrics and Gynecology

## 2018-10-25 ENCOUNTER — Ambulatory Visit (INDEPENDENT_AMBULATORY_CARE_PROVIDER_SITE_OTHER): Payer: BC Managed Care – PPO | Admitting: Obstetrics and Gynecology

## 2018-10-25 ENCOUNTER — Other Ambulatory Visit: Payer: Self-pay

## 2018-10-25 ENCOUNTER — Ambulatory Visit (INDEPENDENT_AMBULATORY_CARE_PROVIDER_SITE_OTHER): Payer: BC Managed Care – PPO

## 2018-10-25 VITALS — BP 118/76 | Wt 296.0 lb

## 2018-10-25 DIAGNOSIS — O0993 Supervision of high risk pregnancy, unspecified, third trimester: Secondary | ICD-10-CM

## 2018-10-25 DIAGNOSIS — O99213 Obesity complicating pregnancy, third trimester: Secondary | ICD-10-CM

## 2018-10-25 DIAGNOSIS — O0990 Supervision of high risk pregnancy, unspecified, unspecified trimester: Secondary | ICD-10-CM

## 2018-10-25 DIAGNOSIS — E039 Hypothyroidism, unspecified: Secondary | ICD-10-CM

## 2018-10-25 DIAGNOSIS — O99210 Obesity complicating pregnancy, unspecified trimester: Secondary | ICD-10-CM

## 2018-10-25 DIAGNOSIS — O10919 Unspecified pre-existing hypertension complicating pregnancy, unspecified trimester: Secondary | ICD-10-CM

## 2018-10-25 DIAGNOSIS — Z3A35 35 weeks gestation of pregnancy: Secondary | ICD-10-CM

## 2018-10-25 DIAGNOSIS — O10913 Unspecified pre-existing hypertension complicating pregnancy, third trimester: Secondary | ICD-10-CM

## 2018-10-25 DIAGNOSIS — Z6841 Body Mass Index (BMI) 40.0 and over, adult: Secondary | ICD-10-CM

## 2018-10-25 NOTE — Progress Notes (Signed)
Routine Prenatal Care Visit  Subjective  Debra Bender is a 33 y.o. G3P2002 at 2946w2d being seen today for ongoing prenatal care.  She is currently monitored for the following issues for this high-risk pregnancy and has Hypothyroidism; History of thyroid disorder; Gastroesophageal reflux disease without esophagitis; Essential hypertension; Supervision of high risk pregnancy, antepartum; Family history of congenital heart disease; BMI 45.0-49.9, adult (HCC); Obesity affecting pregnancy, antepartum; and Chronic hypertension affecting pregnancy on their problem list.  ----------------------------------------------------------------------------------- Patient reports no complaints.   Contractions: Irritability. Vag. Bleeding: None.  Movement: Present. Denies leaking of fluid.  U/S shows AFI 15.5 cm ----------------------------------------------------------------------------------- The following portions of the patient's history were reviewed and updated as appropriate: allergies, current medications, past family history, past medical history, past social history, past surgical history and problem list. Problem list updated.   Objective  Blood pressure 118/76, weight 296 lb (134.3 kg), last menstrual period 02/03/2018, currently breastfeeding. Pregravid weight 270 lb (122.5 kg) Total Weight Gain 26 lb (11.8 kg) Urinalysis: Urine Protein    Urine Glucose    Fetal Status: Fetal Heart Rate (bpm): 135   Movement: Present     General:  Alert, oriented and cooperative. Patient is in no acute distress.  Skin: Skin is warm and dry. No rash noted.   Cardiovascular: Normal heart rate noted  Respiratory: Normal respiratory effort, no problems with respiration noted  Abdomen: Soft, gravid, appropriate for gestational age. Pain/Pressure: Absent     Pelvic:  Cervical exam deferred        Extremities: Normal range of motion.     Mental Status: Normal mood and affect. Normal behavior. Normal judgment and  thought content.   NST: Baseline FHR: 135 beats/min Variability: moderate Accelerations: present Decelerations: absent Tocometry: not done  Interpretation:  INDICATIONS: chronic hypertension RESULTS:  A NST procedure was performed with FHR monitoring and a normal baseline established, appropriate time of 20-40 minutes of evaluation, and accels >2 seen w 15x15 characteristics.  Results show a REACTIVE NST.    Imaging Results [place] Ob Limited  Result Date: 10/25/2018 Patient Name: Debra Bender DOB: [birth year] MRN: [id number] ULTRASOUND REPORT Location: [facility] Date of Service: 10/25/2018 Indications:AFI Findings: Mason JimSingleton intrauterine pregnancy is visualized with FHR at 132 BPM. Fetal presentation is Cephalic. Placenta: anterior. Grade: 1 AFI: 15.5 cm Impression: 1. 5046w2d Viable Singleton Intrauterine pregnancy dated by previously established criteria. 2. AFI is 15.5 cm. Deanna ArtisElyse S Fairbanks, RT The ultrasound images and findings were reviewed by me and I agree with the above report. Thomasene MohairStephen Tymber Stallings, MD, Merlinda FrederickFACOG [facility], [facility] 10/25/2018 9:42 AM        Assessment   33 y.o. [id number] at 5446w2d by  11/27/2018, by Ultrasound presenting for routine prenatal visit  Plan   pregnancy 3 Problems (from 02/03/18 to present)    Problem Noted Resolved   Chronic hypertension affecting pregnancy 07/04/2018 by Conard NovakJackson, Desiree Fleming D, MD No   Overview Addendum 07/04/2018  9:49 AM by Conard NovakJackson, Amber Guthridge D, MD    [x]  Aspirin 81 mg daily after 12 weeks; discontinue after 36 weeks [x]  baseline labs with CBC, CMP, urine protein/creatinine ratio [ ]  no BP meds unless BPs become elevated [ ]  ultrasound for growth at 28, 32, 36 weeks [x]  Aspirin 81 mg daily after 12 weeks; discontinue after 36 weeks [ ]  Baseline EKG   Current antihypertensives:  amlodipine   Baseline and surveillance labs (pulled in [facility], refresh links as needed)  Lab Results  Component Value Date  PLT 304  04/16/2018   CREATININE 0.76 04/16/2018   AST 11 04/16/2018   ALT 11 04/16/2018   PROTCRRATIO 0.09 05/20/2017   PROTEIN24HR 257 (H) 11/24/2016    Antenatal Testing CHTN - O10.919  Group I  BP < 140/90, no preeclampsia, AGA,  nml AFV, +/- meds    Group II BP > 140/90, on meds, no preeclampsia, AGA, nml AFV  20-28-34-38  20-24-28-32-35-38  32//2 x wk  28//BPP wkly then 32//2 x wk  40 no meds; 39 meds  PRN or 37  Pre-eclampsia  GHTN - O13.9/Preeclampsia without severe features  - O14.00   Preeclampsia with severe features - O14.10  Q 3-4wks  Q 2 wks  28//BPP wkly then 32//2 x wk  Inpatient  37  PRN or 34         Obesity affecting pregnancy, antepartum 04/04/2018 by Rod Can, CNM No   Overview Signed 07/04/2018  9:50 AM by Will Bonnet, MD    BMI >=40 [x]  early 1h gtt -  [x]  u/s for dating [x]   [ ]  nutritional goals [ ]  folic acid 1mg  [x]  bASA (>12 weeks) [ ]  consider nutrition consult [ ]  consider maternal EKG 1st trimester [ ]  Growth u/s 28 [ ] , 32 [ ] , 36 weeks [ ]  [ ]  NST/AFI weekly 36+ weeks (36[] , 37[] , 38[] , 39[] , 40[] ) [ ]  IOL by 41 weeks (scheduled, prn [] ) [ ]  Anesthesia referral p 28 weeks       Family history of congenital heart disease  by Donette Larry No   Overview Signed 11/18/2016  1:11 PM by Dalia Heading, CNM    Seen by genetic counselor. Fetal echo scheduled      Supervision of high risk pregnancy, antepartum 10/12/2016 by Rod Can, CNM No   Overview Addendum 09/20/2018  4:20 PM by Homero Fellers, MD    Clinic [facility] Prenatal Labs  Dating 7wk u/s Blood type: B/Positive/-- (07/25 6503) B POS  Genetic Screen    NIPT Diploid XY Antibody:Negative (07/25 0952)neg  Anatomic [place] Incomplete 4/2 Rubella: 1.50 (07/25 0952) Immune Varicella:   immune  GTT Early: normal Third trimester: 107 RPR: Non Reactive (07/25 0952) negative  Rhogam n/a HBsAg: Negative (07/25 0952)   TDaP vaccine  09/20/2018 Flu Shot: 03/07/18 HIV:      Baby Food  Breast                              GBS:   Contraception Considering vasec Pap:10/12/16 NIL  CBB     CS/VBAC NA   Support Person Husband Terry       High risk due to [employer], hx of preeclampsia, morbid obesity, family hx of congenital heart disease, borderline hypothyroidism           Preterm labor symptoms and general obstetric precautions including but not limited to vaginal bleeding, contractions, leaking of fluid and fetal movement were reviewed in detail with the patient. Please refer to After Visit Summary for other counseling recommendations.   Return in about 4 days (around 10/29/2018) for ROB/NST, 7 days u/s for AFI with ROB/NST after.  Prentice Docker, MD, [facility], [facility] 10/25/2018 9:42 AM

## 2018-10-28 ENCOUNTER — Other Ambulatory Visit: Payer: Self-pay | Admitting: Maternal Newborn

## 2018-10-28 DIAGNOSIS — R519 Headache, unspecified: Secondary | ICD-10-CM

## 2018-10-28 DIAGNOSIS — O0990 Supervision of high risk pregnancy, unspecified, unspecified trimester: Secondary | ICD-10-CM

## 2018-10-28 DIAGNOSIS — O26899 Other specified pregnancy related conditions, unspecified trimester: Secondary | ICD-10-CM

## 2018-10-29 ENCOUNTER — Other Ambulatory Visit: Payer: Self-pay

## 2018-10-29 ENCOUNTER — Ambulatory Visit (INDEPENDENT_AMBULATORY_CARE_PROVIDER_SITE_OTHER): Payer: BC Managed Care – PPO | Admitting: Maternal Newborn

## 2018-10-29 VITALS — BP 120/80 | Wt 295.0 lb

## 2018-10-29 DIAGNOSIS — O0990 Supervision of high risk pregnancy, unspecified, unspecified trimester: Secondary | ICD-10-CM

## 2018-10-29 DIAGNOSIS — O10913 Unspecified pre-existing hypertension complicating pregnancy, third trimester: Secondary | ICD-10-CM | POA: Diagnosis not present

## 2018-10-29 DIAGNOSIS — Z3A35 35 weeks gestation of pregnancy: Secondary | ICD-10-CM

## 2018-10-29 LAB — POCT URINALYSIS DIPSTICK OB
Glucose, UA: NEGATIVE
POC,PROTEIN,UA: NEGATIVE

## 2018-10-29 NOTE — Progress Notes (Signed)
Routine Prenatal Care Visit  Subjective  Debra Bender is a 33 y.o. G3P2002 at [redacted]w[redacted]d being seen today for ongoing prenatal care.  She is currently monitored for the following issues for this high-risk pregnancy and has Hypothyroidism; History of thyroid disorder; Gastroesophageal reflux disease without esophagitis; Essential hypertension; Supervision of high risk pregnancy, antepartum; Family history of congenital heart disease; BMI 45.0-49.9, adult ([place]); Obesity affecting pregnancy, antepartum; and Chronic hypertension affecting pregnancy on their problem list.  ----------------------------------------------------------------------------------- Patient reports occasional contractions, more frequent at night but not regular.   Contractions: Irregular. Vag. Bleeding: None.  Movement: Present. No leaking of fluid.  ----------------------------------------------------------------------------------- The following portions of the patient's history were reviewed and updated as appropriate: allergies, current medications, past family history, past medical history, past social history, past surgical history and problem list. Problem list updated.   Objective  Blood pressure 120/80, weight 295 lb (133.8 kg), last menstrual period 02/03/2018, currently breastfeeding. Pregravid weight 270 lb (122.5 kg) Total Weight Gain 25 lb (11.3 kg) Urinalysis: Urine dipstick shows negative for glucose, protein.  Fetal Status: Fetal Heart Rate (bpm): 135   Movement: Present     General:  Alert, oriented and cooperative. Patient is in no acute distress.  Skin: Skin is warm and dry. No rash noted.   Cardiovascular: Normal heart rate noted  Respiratory: Normal respiratory effort, no problems with respiration noted  Abdomen: Soft, gravid, appropriate for gestational age. Pain/Pressure: Present     Pelvic:  Cervical exam deferred        Extremities: Normal range of motion.  Edema: None  Mental Status: Normal  mood and affect. Normal behavior. Normal judgment and thought content.   NST Baseline: 135 Variability: moderate Accelerations: present Decelerations: absent Tocometry: not done The patient was monitored for 20 minutes, fetal heart rate tracing was deemed reactive.  Assessment   33 y.o. [name] at [redacted]w[redacted]d, EDD 11/27/2018 by Ultrasound presenting for a routine prenatal visit.  Plan   pregnancy 3 Problems (from 02/03/18 to present)    Problem Noted Resolved   Chronic hypertension affecting pregnancy 07/04/2018 by Will Bonnet, MD No   Overview Addendum 07/04/2018  9:49 AM by Will Bonnet, MD    [x]  Aspirin 81 mg daily after 12 weeks; discontinue after 36 weeks [x]  baseline labs with CBC, CMP, urine protein/creatinine ratio [ ]  no BP meds unless BPs become elevated [ ]  ultrasound for growth at 28, 32, 36 weeks [x]  Aspirin 81 mg daily after 12 weeks; discontinue after 36 weeks [ ]  Baseline EKG   Current antihypertensives:  amlodipine   Baseline and surveillance labs (pulled in from [facility], refresh links as needed)  Lab Results  Component Value Date   PLT 304 04/16/2018   CREATININE 0.76 04/16/2018   AST 11 04/16/2018   ALT 11 04/16/2018   PROTCRRATIO 0.09 05/20/2017   PROTEIN24HR 257 (H) 11/24/2016    Antenatal Testing CHTN - O10.919  Group I  BP < 140/90, no preeclampsia, AGA,  nml AFV, +/- meds    Group II BP > 140/90, on meds, no preeclampsia, AGA, nml AFV  20-28-34-38  20-24-28-32-35-38  32//2 x wk  28//BPP wkly then 32//2 x wk  40 no meds; 39 meds  PRN or 37  Pre-eclampsia  GHTN - O13.9/Preeclampsia without severe features  - O14.00   Preeclampsia with severe features - O14.10  Q 3-4wks  Q 2 wks  28//BPP wkly then 32//2 x wk  Inpatient  37  PRN or 34  Obesity affecting pregnancy, antepartum 04/04/2018 by Tresea MallGledhill, Jane, CNM No   Overview Signed 07/04/2018  9:50 AM by Conard NovakJackson, Stephen D, MD    BMI >=40 [x]  early 1h gtt -  [x]  u/s for  dating [x]   [ ]  nutritional goals [ ]  folic acid 1mg  [x]  bASA (>12 weeks) [ ]  consider nutrition consult [ ]  consider maternal EKG 1st trimester [ ]  Growth u/s 28 [ ] , 32 [ ] , 36 weeks [ ]  [ ]  NST/AFI weekly 36+ weeks (36[] , 37[] , 38[] , 39[] , 40[] ) [ ]  IOL by 41 weeks (scheduled, prn [] ) [ ]  Anesthesia referral p 28 weeks       Family history of congenital heart disease  by Debra Bender, Debra Bender No   Overview Signed 11/18/2016  1:11 PM by Farrel ConnersGutierrez, Colleen, CNM    Seen by genetic counselor. Fetal echo scheduled      Supervision of high risk pregnancy, antepartum 10/12/2016 by Tresea MallGledhill, Jane, CNM No   Overview Addendum 09/20/2018  4:20 PM by Natale MilchSchuman, Christanna R, MD    Clinic [facility] Prenatal Labs  Dating 7wk u/s Blood type: B/Positive/-- (07/25 60450952) B POS  Genetic Screen    NIPT Diploid XY Antibody:Negative (07/25 0952)neg  Anatomic US Incomplete 4/2 Rubella: 1.50 (07/25 0952) Immune Varicella:   immune  GTT Early: normal Third trimester: 107 RPR: Non Reactive (07/25 0952) negative  Rhogam n/a HBsAg: Negative (07/25 0952)   TDaP vaccine  09/20/2018 Flu Shot: 03/07/18 HIV:     Baby Food  Breast                              GBS:   Contraception Considering vasec Pap:10/12/16 NIL  CBB     CS/VBAC NA   Support Person Husband Debra Bender       High risk due to [employer], hx of preeclampsia, morbid obesity, family hx of congenital heart disease, borderline hypothyroidism        Reactive NST today, no symptoms of pre-eclampsia.  Preterm labor symptoms and general obstetric precautions including but not limited to vaginal bleeding, contractions, leaking of fluid and fetal movement were reviewed.  Please refer to After Visit Summary for other counseling recommendations.   Keep appointment 11/01/2018 for ROB with NST/AFI and return in about 1 week (around 11/05/2018) for ROB with NST.  Marcelyn BruinsJacelyn Schmid, CNM 10/29/2018  10:58 AM

## 2018-11-01 ENCOUNTER — Other Ambulatory Visit (HOSPITAL_COMMUNITY)
Admission: RE | Admit: 2018-11-01 | Discharge: 2018-11-01 | Disposition: A | Discharge status: Home / Self Care | Payer: BC Managed Care – PPO | Source: Ambulatory Visit | Attending: Advanced Practice Midwife | Admitting: Advanced Practice Midwife

## 2018-11-01 ENCOUNTER — Encounter: Payer: Self-pay | Admitting: Advanced Practice Midwife

## 2018-11-01 ENCOUNTER — Ambulatory Visit (INDEPENDENT_AMBULATORY_CARE_PROVIDER_SITE_OTHER): Payer: BC Managed Care – PPO

## 2018-11-01 ENCOUNTER — Other Ambulatory Visit: Payer: Self-pay

## 2018-11-01 ENCOUNTER — Ambulatory Visit (INDEPENDENT_AMBULATORY_CARE_PROVIDER_SITE_OTHER): Payer: BC Managed Care – PPO | Admitting: Advanced Practice Midwife

## 2018-11-01 VITALS — BP 128/74 | Wt 297.0 lb

## 2018-11-01 DIAGNOSIS — Z3A36 36 weeks gestation of pregnancy: Secondary | ICD-10-CM

## 2018-11-01 DIAGNOSIS — Z113 Encounter for screening for infections with a predominantly sexual mode of transmission: Secondary | ICD-10-CM | POA: Insufficient documentation

## 2018-11-01 DIAGNOSIS — O10919 Unspecified pre-existing hypertension complicating pregnancy, unspecified trimester: Secondary | ICD-10-CM

## 2018-11-01 DIAGNOSIS — O10913 Unspecified pre-existing hypertension complicating pregnancy, third trimester: Secondary | ICD-10-CM

## 2018-11-01 DIAGNOSIS — O0990 Supervision of high risk pregnancy, unspecified, unspecified trimester: Secondary | ICD-10-CM

## 2018-11-01 DIAGNOSIS — Z3685 Encounter for antenatal screening for Streptococcus B: Secondary | ICD-10-CM

## 2018-11-01 DIAGNOSIS — O99210 Obesity complicating pregnancy, unspecified trimester: Secondary | ICD-10-CM

## 2018-11-01 NOTE — Progress Notes (Signed)
No vb. No lof. NST /ultrasound today

## 2018-11-01 NOTE — Progress Notes (Signed)
Routine Prenatal Care Visit  Subjective  Debra Bender is a 33 y.o. G3P2002 at [redacted]w[redacted]d being seen today for ongoing prenatal care.  She is currently monitored for the following issues for this high-risk pregnancy and has Hypothyroidism; History of thyroid disorder; Gastroesophageal reflux disease without esophagitis; Essential hypertension; Supervision of high risk pregnancy, antepartum; Family history of congenital heart disease; BMI 45.0-49.9, adult ([place]); Obesity affecting pregnancy, antepartum; and Chronic hypertension affecting pregnancy on their problem list.  ----------------------------------------------------------------------------------- Patient reports irregular contractions especially at night. She denies headaches, visual changes, epigastric pain.   Contractions: Irregular. Vag. Bleeding: None.  Movement: Present. Denies leaking of fluid.  ----------------------------------------------------------------------------------- The following portions of the patient's history were reviewed and updated as appropriate: allergies, current medications, past family history, past medical history, past social history, past surgical history and problem list. Problem list updated.   Objective  Blood pressure 128/74, weight 297 lb (134.7 kg), last menstrual period 02/03/2018, currently breastfeeding. Pregravid weight 270 lb (122.5 kg) Total Weight Gain 27 lb (12.2 kg) Urinalysis: Urine Protein    Urine Glucose    Fetal Status: Fetal Heart Rate (bpm): 140   Movement: Present  Presentation: Vertex, Cervix closed Reactive NST: 20 minute tracing, 140 bpm baseline, moderate variability, +accelerations, -decelerations AFI: 13.1  General:  Alert, oriented and cooperative. Patient is in no acute distress.  Skin: Skin is warm and dry. No rash noted.   Cardiovascular: Normal heart rate noted  Respiratory: Normal respiratory effort, no problems with respiration noted  Abdomen: Soft, gravid,  appropriate for gestational age. Pain/Pressure: Present     Pelvic:  Cervical exam performed   closed/thick  GBS/aptima today  Extremities: Normal range of motion.  Edema: None  Mental Status: Normal mood and affect. Normal behavior. Normal judgment and thought content.   Assessment   33 y.o. [id number] at [redacted]w[redacted]d by  11/27/2018, by Ultrasound presenting for routine prenatal visit  Plan   pregnancy 3 Problems (from 02/03/18 to present)    Problem Noted Resolved   Chronic hypertension affecting pregnancy 07/04/2018 by Will Bonnet, MD No   Overview Addendum 07/04/2018  9:49 AM by Will Bonnet, MD    [x]  Aspirin 81 mg daily after 12 weeks; discontinue after 36 weeks [x]  baseline labs with CBC, CMP, urine protein/creatinine ratio [ ]  no BP meds unless BPs become elevated [ ]  ultrasound for growth at 28, 32, 36 weeks [x]  Aspirin 81 mg daily after 12 weeks; discontinue after 36 weeks [ ]  Baseline EKG   Current antihypertensives:  amlodipine   Baseline and surveillance labs (pulled in from [facility], refresh links as needed)  Lab Results  Component Value Date   PLT 304 04/16/2018   CREATININE 0.76 04/16/2018   AST 11 04/16/2018   ALT 11 04/16/2018   PROTCRRATIO 0.09 05/20/2017   PROTEIN24HR 257 (H) 11/24/2016    Antenatal Testing CHTN - O10.919  Group I  BP < 140/90, no preeclampsia, AGA,  nml AFV, +/- meds    Group II BP > 140/90, on meds, no preeclampsia, AGA, nml AFV  20-28-34-38  20-24-28-32-35-38  32//2 x wk  28//BPP wkly then 32//2 x wk  40 no meds; 39 meds  PRN or 37  Pre-eclampsia  GHTN - O13.9/Preeclampsia without severe features  - O14.00   Preeclampsia with severe features - O14.10  Q 3-4wks  Q 2 wks  28//BPP wkly then 32//2 x wk  Inpatient  37  PRN or 34  Obesity affecting pregnancy, antepartum 04/04/2018 by Tresea MallGledhill, Shawndell Schillaci, CNM No   Overview Signed 07/04/2018  9:50 AM by Conard NovakJackson, Stephen D, MD    BMI >=40 [x]  early 1h gtt -  [x]  u/s for  dating [x]   [ ]  nutritional goals [ ]  folic acid 1mg  [x]  bASA (>12 weeks) [ ]  consider nutrition consult [ ]  consider maternal EKG 1st trimester [ ]  Growth u/s 28 [ ] , 32 [ ] , 36 weeks [ ]  [ ]  NST/AFI weekly 36+ weeks (36[] , 37[] , 38[] , 39[] , 40[] ) [ ]  IOL by 41 weeks (scheduled, prn [] ) [ ]  Anesthesia referral p 28 weeks       Family history of congenital heart disease  by Katrina StackWells, Deborah No   Overview Signed 11/18/2016  1:11 PM by Farrel ConnersGutierrez, Colleen, CNM    Seen by genetic counselor. Fetal echo scheduled      Supervision of high risk pregnancy, antepartum 10/12/2016 by Tresea MallGledhill, Werner Labella, CNM No   Overview Addendum 09/20/2018  4:20 PM by Natale MilchSchuman, Christanna R, MD    Clinic [facility] Prenatal Labs  Dating 7wk u/s Blood type: B/Positive/-- (07/25 [id number]) B POS  Genetic Screen    NIPT Diploid XY Antibody:Negative (07/25 0952)neg  Anatomic US Incomplete 4/2 Rubella: 1.50 (07/25 0952) Immune Varicella:   immune  GTT Early: normal Third trimester: 107 RPR: Non Reactive (07/25 0952) negative  Rhogam n/a HBsAg: Negative (07/25 0952)   TDaP vaccine  09/20/2018 Flu Shot: 03/07/18 HIV:     Baby Food  Breast                              GBS:   Contraception Considering vasec Pap:10/12/16 NIL  CBB     CS/VBAC NA   Support Person Husband Terry       High risk due to EMCORCHTN, hx of preeclampsia, morbid obesity, family hx of congenital heart disease, borderline hypothyroidism           Preterm labor symptoms and general obstetric precautions including but not limited to vaginal bleeding, contractions, leaking of fluid and fetal movement were reviewed in detail with the patient. Please refer to After Visit Summary for other counseling recommendations.  Discontinue baby ASA  Has follow up scheduled  Tresea MallJane Morocco Bender, CNM 11/01/2018 9:46 AM

## 2018-11-01 NOTE — Patient Instructions (Signed)
Braxton Hicks Contractions Contractions of the uterus can occur throughout pregnancy, but they are not always a sign that you are in labor. You may have practice contractions called Braxton Hicks contractions. These false labor contractions are sometimes confused with true labor. What are Braxton Hicks contractions? Braxton Hicks contractions are tightening movements that occur in the muscles of the uterus before labor. Unlike true labor contractions, these contractions do not result in opening (dilation) and thinning of the cervix. Toward the end of pregnancy (32-34 weeks), Braxton Hicks contractions can happen more often and may become stronger. These contractions are sometimes difficult to tell apart from true labor because they can be very uncomfortable. You should not feel embarrassed if you go to the hospital with false labor. Sometimes, the only way to tell if you are in true labor is for your health care provider to look for changes in the cervix. The health care provider will do a physical exam and may monitor your contractions. If you are not in true labor, the exam should show that your cervix is not dilating and your water has not broken. If there are no other health problems associated with your pregnancy, it is completely safe for you to be sent home with false labor. You may continue to have Braxton Hicks contractions until you go into true labor. How to tell the difference between true labor and false labor True labor  Contractions last 30-70 seconds.  Contractions become very regular.  Discomfort is usually felt in the top of the uterus, and it spreads to the lower abdomen and low back.  Contractions do not go away with walking.  Contractions usually become more intense and increase in frequency.  The cervix dilates and gets thinner. False labor  Contractions are usually shorter and not as strong as true labor contractions.  Contractions are usually irregular.  Contractions  are often felt in the front of the lower abdomen and in the groin.  Contractions may go away when you walk around or change positions while lying down.  Contractions get weaker and are shorter-lasting as time goes on.  The cervix usually does not dilate or become thin. Follow these instructions at home:   Take over-the-counter and prescription medicines only as told by your health care provider.  Keep up with your usual exercises and follow other instructions from your health care provider.  Eat and drink lightly if you think you are going into labor.  If Braxton Hicks contractions are making you uncomfortable: ? Change your position from lying down or resting to walking, or change from walking to resting. ? Sit and rest in a tub of warm water. ? Drink enough fluid to keep your urine pale yellow. Dehydration may cause these contractions. ? Do slow and deep breathing several times an hour.  Keep all follow-up prenatal visits as told by your health care provider. This is important. Contact a health care provider if:  You have a fever.  You have continuous pain in your abdomen. Get help right away if:  Your contractions become stronger, more regular, and closer together.  You have fluid leaking or gushing from your vagina.  You pass blood-tinged mucus (bloody show).  You have bleeding from your vagina.  You have low back pain that you never had before.  You feel your baby's head pushing down and causing pelvic pressure.  Your baby is not moving inside you as much as it used to. Summary  Contractions that occur before labor are   called Braxton Hicks contractions, false labor, or practice contractions.  Braxton Hicks contractions are usually shorter, weaker, farther apart, and less regular than true labor contractions. True labor contractions usually become progressively stronger and regular, and they become more frequent.  Manage discomfort from Braxton Hicks contractions  by changing position, resting in a warm bath, drinking plenty of water, or practicing deep breathing. This information is not intended to replace advice given to you by your health care provider. Make sure you discuss any questions you have with your health care provider. Document Released: 08/03/2016 Document Revised: 03/02/2017 Document Reviewed: 08/03/2016 Elsevier Patient Education  2020 [employer]  

## 2018-11-02 LAB — CERVICOVAGINAL ANCILLARY ONLY
Chlamydia: NEGATIVE
Neisseria Gonorrhea: NEGATIVE
Trichomonas: NEGATIVE

## 2018-11-03 LAB — STREP GP B NAA: Strep Gp B NAA: NEGATIVE

## 2018-11-05 ENCOUNTER — Encounter: Payer: Self-pay | Admitting: Maternal Newborn

## 2018-11-05 ENCOUNTER — Other Ambulatory Visit: Payer: Self-pay

## 2018-11-05 ENCOUNTER — Ambulatory Visit (INDEPENDENT_AMBULATORY_CARE_PROVIDER_SITE_OTHER): Payer: BC Managed Care – PPO | Admitting: Maternal Newborn

## 2018-11-05 VITALS — BP 126/80 | Wt 298.8 lb

## 2018-11-05 DIAGNOSIS — O10913 Unspecified pre-existing hypertension complicating pregnancy, third trimester: Secondary | ICD-10-CM | POA: Diagnosis not present

## 2018-11-05 DIAGNOSIS — Z3A36 36 weeks gestation of pregnancy: Secondary | ICD-10-CM

## 2018-11-05 DIAGNOSIS — O0990 Supervision of high risk pregnancy, unspecified, unspecified trimester: Secondary | ICD-10-CM

## 2018-11-05 NOTE — Patient Instructions (Signed)
Third Trimester of Pregnancy The third trimester is from week 28 through week 40 (months 7 through 9). The third trimester is a time when the unborn baby (fetus) is growing rapidly. At the end of the ninth month, the fetus is about 20 inches in length and weighs 6-10 pounds. Body changes during your third trimester Your body will continue to go through many changes during pregnancy. The changes vary from woman to woman. During the third trimester:  Your weight will continue to increase. You can expect to gain 25-35 pounds (11-16 kg) by the end of the pregnancy.  You may begin to get stretch marks on your hips, abdomen, and breasts.  You may urinate more often because the fetus is moving lower into your pelvis and pressing on your bladder.  You may develop or continue to have heartburn. This is caused by increased hormones that slow down muscles in the digestive tract.  You may develop or continue to have constipation because increased hormones slow digestion and cause the muscles that push waste through your intestines to relax.  You may develop hemorrhoids. These are swollen veins (varicose veins) in the rectum that can itch or be painful.  You may develop swollen, bulging veins (varicose veins) in your legs.  You may have increased body aches in the pelvis, back, or thighs. This is due to weight gain and increased hormones that are relaxing your joints.  You may have changes in your hair. These can include thickening of your hair, rapid growth, and changes in texture. Some women also have hair loss during or after pregnancy, or hair that feels dry or thin. Your hair will most likely return to normal after your baby is born.  Your breasts will continue to grow and they will continue to become tender. A yellow fluid (colostrum) may leak from your breasts. This is the first milk you are producing for your baby.  Your belly button may stick out.  You may notice more swelling in your hands,  face, or ankles.  You may have increased tingling or numbness in your hands, arms, and legs. The skin on your belly may also feel numb.  You may feel short of breath because of your expanding uterus.  You may have more problems sleeping. This can be caused by the size of your belly, increased need to urinate, and an increase in your body's metabolism.  You may notice the fetus "dropping," or moving lower in your abdomen (lightening).  You may have increased vaginal discharge.  You may notice your joints feel loose and you may have pain around your pelvic bone. What to expect at prenatal visits You will have prenatal exams every 2 weeks until week 36. Then you will have weekly prenatal exams. During a routine prenatal visit:  You will be weighed to make sure you and the baby are growing normally.  Your blood pressure will be taken.  Your abdomen will be measured to track your baby's growth.  The fetal heartbeat will be listened to.  Any test results from the previous visit will be discussed.  You may have a cervical check near your due date to see if your cervix has softened or thinned (effaced).  You will be tested for Group B streptococcus. This happens between 35 and 37 weeks. Your health care provider may ask you:  What your birth plan is.  How you are feeling.  If you are feeling the baby move.  If you have had any abnormal   symptoms, such as leaking fluid, bleeding, severe headaches, or abdominal cramping.  If you are using any tobacco products, including cigarettes, chewing tobacco, and electronic cigarettes.  If you have any questions. Other tests or screenings that may be performed during your third trimester include:  Blood tests that check for low iron levels (anemia).  Fetal testing to check the health, activity level, and growth of the fetus. Testing is done if you have certain medical conditions or if there are problems during the pregnancy.  Nonstress test  (NST). This test checks the health of your baby to make sure there are no signs of problems, such as the baby not getting enough oxygen. During this test, a belt is placed around your belly. The baby is made to move, and its heart rate is monitored during movement. What is false labor? False labor is a condition in which you feel small, irregular tightenings of the muscles in the womb (contractions) that usually go away with rest, changing position, or drinking water. These are called Braxton Hicks contractions. Contractions may last for hours, days, or even weeks before true labor sets in. If contractions come at regular intervals, become more frequent, increase in intensity, or become painful, you should see your health care provider. What are the signs of labor?  Abdominal cramps.  Regular contractions that start at 10 minutes apart and become stronger and more frequent with time.  Contractions that start on the top of the uterus and spread down to the lower abdomen and back.  Increased pelvic pressure and dull back pain.  A watery or bloody mucus discharge that comes from the vagina.  Leaking of amniotic fluid. This is also known as your "water breaking." It could be a slow trickle or a gush. Let your health care provider know if it has a color or strange odor. If you have any of these signs, call your health care provider right away, even if it is before your due date. Follow these instructions at home: Medicines  Follow your health care provider's instructions regarding medicine use. Specific medicines may be either safe or unsafe to take during pregnancy.  Take a prenatal vitamin that contains at least 600 micrograms (mcg) of folic acid.  If you develop constipation, try taking a stool softener if your health care provider approves. Eating and drinking   Eat a balanced diet that includes fresh fruits and vegetables, whole grains, good sources of protein such as meat, eggs, or tofu,  and low-fat dairy. Your health care provider will help you determine the amount of weight gain that is right for you.  Avoid raw meat and uncooked cheese. These carry germs that can cause birth defects in the baby.  If you have low calcium intake from food, talk to your health care provider about whether you should take a daily calcium supplement.  Eat four or five small meals rather than three large meals a day.  Limit foods that are high in fat and processed sugars, such as fried and sweet foods.  To prevent constipation: ? Drink enough fluid to keep your urine clear or pale yellow. ? Eat foods that are high in fiber, such as fresh fruits and vegetables, whole grains, and beans. Activity  Exercise only as directed by your health care provider. Most women can continue their usual exercise routine during pregnancy. Try to exercise for 30 minutes at least 5 days a week. Stop exercising if you experience uterine contractions.  Avoid heavy lifting.  Do   not exercise in extreme heat or humidity, or at high altitudes.  Wear low-heel, comfortable shoes.  Practice good posture.  You may continue to have sex unless your health care provider tells you otherwise. Relieving pain and discomfort  Take frequent breaks and rest with your legs elevated if you have leg cramps or low back pain.  Take warm sitz baths to soothe any pain or discomfort caused by hemorrhoids. Use hemorrhoid cream if your health care provider approves.  Wear a good support bra to prevent discomfort from breast tenderness.  If you develop varicose veins: ? Wear support pantyhose or compression stockings as told by your healthcare provider. ? Elevate your feet for 15 minutes, 3-4 times a day. Prenatal care  Write down your questions. Take them to your prenatal visits.  Keep all your prenatal visits as told by your health care provider. This is important. Safety  Wear your seat belt at all times when driving.  Make  a list of emergency phone numbers, including numbers for family, friends, the hospital, and police and fire departments. General instructions  Avoid cat litter boxes and soil used by cats. These carry germs that can cause birth defects in the baby. If you have a cat, ask someone to clean the litter box for you.  Do not travel far distances unless it is absolutely necessary and only with the approval of your health care provider.  Do not use hot tubs, steam rooms, or saunas.  Do not drink alcohol.  Do not use any products that contain nicotine or tobacco, such as cigarettes and e-cigarettes. If you need help quitting, ask your health care provider.  Do not use any medicinal herbs or unprescribed drugs. These chemicals affect the formation and growth of the baby.  Do not douche or use tampons or scented sanitary pads.  Do not cross your legs for long periods of time.  To prepare for the arrival of your baby: ? Take prenatal classes to understand, practice, and ask questions about labor and delivery. ? Make a trial run to the hospital. ? Visit the hospital and tour the maternity area. ? Arrange for maternity or paternity leave through employers. ? Arrange for family and friends to take care of pets while you are in the hospital. ? Purchase a rear-facing car seat and make sure you know how to install it in your car. ? Pack your hospital bag. ? Prepare the baby's nursery. Make sure to remove all pillows and stuffed animals from the baby's crib to prevent suffocation.  Visit your dentist if you have not gone during your pregnancy. Use a soft toothbrush to brush your teeth and be gentle when you floss. Contact a health care provider if:  You are unsure if you are in labor or if your water has broken.  You become dizzy.  You have mild pelvic cramps, pelvic pressure, or nagging pain in your abdominal area.  You have lower back pain.  You have persistent nausea, vomiting, or diarrhea.   You have an unusual or bad smelling vaginal discharge.  You have pain when you urinate. Get help right away if:  Your water breaks before 37 weeks.  You have regular contractions less than 5 minutes apart before 37 weeks.  You have a fever.  You are leaking fluid from your vagina.  You have spotting or bleeding from your vagina.  You have severe abdominal pain or cramping.  You have rapid weight loss or weight gain.  You have   shortness of breath with chest pain.  You notice sudden or extreme swelling of your face, hands, ankles, feet, or legs.  Your baby makes fewer than 10 movements in 2 hours.  You have severe headaches that do not go away when you take medicine.  You have vision changes. Summary  The third trimester is from week 28 through week 40, months 7 through 9. The third trimester is a time when the unborn baby (fetus) is growing rapidly.  During the third trimester, your discomfort may increase as you and your baby continue to gain weight. You may have abdominal, leg, and back pain, sleeping problems, and an increased need to urinate.  During the third trimester your breasts will keep growing and they will continue to become tender. A yellow fluid (colostrum) may leak from your breasts. This is the first milk you are producing for your baby.  False labor is a condition in which you feel small, irregular tightenings of the muscles in the womb (contractions) that eventually go away. These are called Braxton Hicks contractions. Contractions may last for hours, days, or even weeks before true labor sets in.  Signs of labor can include: abdominal cramps; regular contractions that start at 10 minutes apart and become stronger and more frequent with time; watery or bloody mucus discharge that comes from the vagina; increased pelvic pressure and dull back pain; and leaking of amniotic fluid. This information is not intended to replace advice given to you by your health  care provider. Make sure you discuss any questions you have with your health care provider. Document Released: 03/14/2001 Document Revised: 07/11/2018 Document Reviewed: 04/25/2016 Elsevier Patient Education  2020 [employer]  

## 2018-11-05 NOTE — Progress Notes (Signed)
Routine Prenatal Care Visit  Subjective  Debra Bender is a 33 y.o. G3P2002 at 2516w6d being seen today for ongoing prenatal care.  She is currently monitored for the following issues for this high-risk pregnancy and has Hypothyroidism; History of thyroid disorder; Gastroesophageal reflux disease without esophagitis; Essential hypertension; Supervision of high risk pregnancy, antepartum; Family history of congenital heart disease; BMI 45.0-49.9, adult (HCC); Obesity affecting pregnancy, antepartum; and Chronic hypertension affecting pregnancy on their problem list.  ----------------------------------------------------------------------------------- Patient reports a mild sinus headache. No visual changes, epigastric pain, or edema. Contractions: Irritability. Vag. Bleeding: None.  Movement: Present. No leaking of fluid.  ----------------------------------------------------------------------------------- The following portions of the patient's history were reviewed and updated as appropriate: allergies, current medications, past family history, past medical history, past social history, past surgical history and problem list. Problem list updated.   Objective  Blood pressure 130/90, weight 298 lb 12.8 oz (135.5 kg), last menstrual period 02/03/2018, currently breastfeeding. Pregravid weight 270 lb (122.5 kg) Total Weight Gain 28 lb 12.8 oz (13.1 kg)  Fetal Status: Fetal Heart Rate (bpm): 135   Movement: Present     General:  Alert, oriented and cooperative. Patient is in no acute distress.  Skin: Skin is warm and dry. No rash noted.   Cardiovascular: Normal heart rate noted  Respiratory: Normal respiratory effort, no problems with respiration noted  Abdomen: Soft, gravid, appropriate for gestational age. Pain/Pressure: Present     Pelvic:  Cervical exam deferred        Extremities: Normal range of motion.  Edema: None  Mental Status: Normal mood and affect. Normal behavior. Normal  judgment and thought content.   NST Baseline: 135 Variability: moderate Accelerations: present Decelerations: absent Tocometry: not done The patient was monitored for 20 minutes, fetal heart rate tracing was deemed reactive  Assessment   33 y.o. [id number] at 7616w6d, EDD 11/27/2018 by Ultrasound presenting for a routine prenatal visit.  Plan   pregnancy 3 Problems (from 02/03/18 to present)    Problem Noted Resolved   Chronic hypertension affecting pregnancy 07/04/2018 by Conard NovakJackson, Stephen D, MD No   Overview Addendum 07/04/2018  9:49 AM by Conard NovakJackson, Stephen D, MD    [x]  Aspirin 81 mg daily after 12 weeks; discontinue after 36 weeks [x]  baseline labs with CBC, CMP, urine protein/creatinine ratio [ ]  no BP meds unless BPs become elevated [ ]  ultrasound for growth at 28, 32, 36 weeks [x]  Aspirin 81 mg daily after 12 weeks; discontinue after 36 weeks [ ]  Baseline EKG   Current antihypertensives:  amlodipine   Baseline and surveillance labs ([facility], refresh links as needed)  Lab Results  Component Value Date   PLT 304 04/16/2018   CREATININE 0.76 04/16/2018   AST 11 04/16/2018   ALT 11 04/16/2018   PROTCRRATIO 0.09 05/20/2017   PROTEIN24HR 257 (H) 11/24/2016    Antenatal Testing CHTN - O10.919  Group I  BP < 140/90, no preeclampsia, AGA,  nml AFV, +/- meds    Group II BP > 140/90, on meds, no preeclampsia, AGA, nml AFV  20-28-34-38  20-24-28-32-35-38  32//2 x wk  28//BPP wkly then 32//2 x wk  40 no meds; 39 meds  PRN or 37  Pre-eclampsia  GHTN - O13.9/Preeclampsia without severe features  - O14.00   Preeclampsia with severe features - O14.10  Q 3-4wks  Q 2 wks  28//BPP wkly then 32//2 x wk  Inpatient  37  PRN or 34  Obesity affecting pregnancy, antepartum 04/04/2018 by Rod Can, CNM No   Overview Signed 07/04/2018  9:50 AM by Will Bonnet, MD    BMI >=40 [x]  early 1h gtt -  [x]  u/s for dating [x]   [ ]  nutritional goals [ ]   folic acid 1mg  [x]  bASA (>12 weeks) [ ]  consider nutrition consult [ ]  consider maternal EKG 1st trimester [ ]  Growth u/s 28 [ ] , 32 [ ] , 36 weeks [ ]  [ ]  NST/AFI weekly 36+ weeks (36[] , 37[] , 38[] , 39[] , 40[] ) [ ]  IOL by 41 weeks (scheduled, prn [] ) [ ]  Anesthesia referral p 28 weeks       Family history of congenital heart disease  by Donette Larry No   Overview Signed 11/18/2016  1:11 PM by Dalia Heading, CNM    Seen by genetic counselor. Fetal echo scheduled      Supervision of high risk pregnancy, antepartum 10/12/2016 by Rod Can, CNM No   Overview Addendum 09/20/2018  4:20 PM by Homero Fellers, MD    Clinic [facility] Prenatal Labs  Dating 7wk u/s Blood type: B/Positive/-- (07/25 8786) B POS  Genetic Screen    NIPT Diploid XY Antibody:Negative (07/25 0952)neg  Anatomic [place] Incomplete 4/2 Rubella: 1.50 (07/25 0952) Immune Varicella:   immune  GTT Early: normal Third trimester: 107 RPR: Non Reactive (07/25 0952) negative  Rhogam n/a HBsAg: Negative (07/25 0952)   TDaP vaccine  09/20/2018 Flu Shot: 03/07/18 HIV:     Baby Food  Breast                              GBS:   Contraception Considering vasec Pap:10/12/16 NIL  CBB     CS/VBAC NA   Support Person Husband Terry       High risk due to [employer], hx of preeclampsia, morbid obesity, family hx of congenital heart disease, borderline hypothyroidism        Second BP reading 126/80.  Preterm labor symptoms and general obstetric precautions including but not limited to vaginal bleeding, contractions, leaking of fluid and fetal movement were reviewed.  Please refer to After Visit Summary for other counseling recommendations.   Return in about 1 week (around 11/12/2018) for ROB with NST.  Avel Sensor, CNM 11/06/2018  7:57 AM

## 2018-11-05 NOTE — Progress Notes (Signed)
ROB/NST- no concerns 

## 2018-11-08 ENCOUNTER — Other Ambulatory Visit: Payer: Self-pay

## 2018-11-08 ENCOUNTER — Ambulatory Visit (INDEPENDENT_AMBULATORY_CARE_PROVIDER_SITE_OTHER): Payer: BC Managed Care – PPO | Admitting: Obstetrics and Gynecology

## 2018-11-08 ENCOUNTER — Ambulatory Visit (INDEPENDENT_AMBULATORY_CARE_PROVIDER_SITE_OTHER): Payer: BC Managed Care – PPO

## 2018-11-08 VITALS — BP 118/72 | Wt 299.0 lb

## 2018-11-08 DIAGNOSIS — O10919 Unspecified pre-existing hypertension complicating pregnancy, unspecified trimester: Secondary | ICD-10-CM

## 2018-11-08 DIAGNOSIS — O0993 Supervision of high risk pregnancy, unspecified, third trimester: Secondary | ICD-10-CM

## 2018-11-08 DIAGNOSIS — Z3A37 37 weeks gestation of pregnancy: Secondary | ICD-10-CM | POA: Diagnosis not present

## 2018-11-08 DIAGNOSIS — Z6841 Body Mass Index (BMI) 40.0 and over, adult: Secondary | ICD-10-CM

## 2018-11-08 DIAGNOSIS — O10913 Unspecified pre-existing hypertension complicating pregnancy, third trimester: Secondary | ICD-10-CM

## 2018-11-08 DIAGNOSIS — Z8279 Family history of other congenital malformations, deformations and chromosomal abnormalities: Secondary | ICD-10-CM

## 2018-11-08 DIAGNOSIS — O99210 Obesity complicating pregnancy, unspecified trimester: Secondary | ICD-10-CM

## 2018-11-08 DIAGNOSIS — Z8639 Personal history of other endocrine, nutritional and metabolic disease: Secondary | ICD-10-CM

## 2018-11-08 DIAGNOSIS — I10 Essential (primary) hypertension: Secondary | ICD-10-CM

## 2018-11-08 DIAGNOSIS — O0990 Supervision of high risk pregnancy, unspecified, unspecified trimester: Secondary | ICD-10-CM

## 2018-11-08 DIAGNOSIS — O99213 Obesity complicating pregnancy, third trimester: Secondary | ICD-10-CM

## 2018-11-08 LAB — POCT URINALYSIS DIPSTICK OB
Glucose, UA: NEGATIVE
POC,PROTEIN,UA: NEGATIVE

## 2018-11-08 NOTE — Progress Notes (Signed)
ROB NST/AFI 

## 2018-11-08 NOTE — Progress Notes (Signed)
Routine Prenatal Care Visit  Subjective  Debra Bender is a 33 y.o. G3P2002 at [redacted]w[redacted]d being seen today for ongoing prenatal care.  She is currently monitored for the following issues for this high-risk pregnancy and has Hypothyroidism; History of thyroid disorder; Gastroesophageal reflux disease without esophagitis; Essential hypertension; Supervision of high risk pregnancy, antepartum; Family history of congenital heart disease; BMI 45.0-49.9, adult ([place]); Obesity affecting pregnancy, antepartum; and Chronic hypertension affecting pregnancy on their problem list.  ----------------------------------------------------------------------------------- Patient reports no complaints.   Contractions: Irregular. Vag. Bleeding: None.  Movement: Present. Denies leaking of fluid.  ----------------------------------------------------------------------------------- The following portions of the patient's history were reviewed and updated as appropriate: allergies, current medications, past family history, past medical history, past social history, past surgical history and problem list. Problem list updated.   Objective  Blood pressure 118/72, weight 299 lb (135.6 kg), last menstrual period 02/03/2018, currently breastfeeding. Pregravid weight 270 lb (122.5 kg) Total Weight Gain 29 lb (13.2 kg) Urinalysis:      Fetal Status: Fetal Heart Rate (bpm): 150   Movement: Present  Presentation: Vertex  General:  Alert, oriented and cooperative. Patient is in no acute distress.  Skin: Skin is warm and dry. No rash noted.   Cardiovascular: Normal heart rate noted  Respiratory: Normal respiratory effort, no problems with respiration noted  Abdomen: Soft, gravid, appropriate for gestational age. Pain/Pressure: Present     Pelvic:  Cervical exam deferred        Extremities: Normal range of motion.     ental Status: Normal mood and affect. Normal behavior. Normal judgment and thought content.      Assessment   33 y.o. [id number] at [redacted]w[redacted]d by  11/27/2018, by Ultrasound presenting for routine prenatal visit  Plan   pregnancy 3 Problems (from 02/03/18 to present)    Problem Noted Resolved   Chronic hypertension affecting pregnancy 07/04/2018 by Will Bonnet, MD No   Overview Addendum 07/04/2018  9:49 AM by Will Bonnet, MD    [x]  Aspirin 81 mg daily after 12 weeks; discontinue after 36 weeks [x]  baseline labs with CBC, CMP, urine protein/creatinine ratio [ ]  no BP meds unless BPs become elevated [ ]  ultrasound for growth at 28, 32, 36 weeks [x]  Aspirin 81 mg daily after 12 weeks; discontinue after 36 weeks [ ]  Baseline EKG   Current antihypertensives:  amlodipine   Baseline and surveillance labs (pulled in from [facility], refresh links as needed)  Lab Results  Component Value Date   PLT 304 04/16/2018   CREATININE 0.76 04/16/2018   AST 11 04/16/2018   ALT 11 04/16/2018   PROTCRRATIO 0.09 05/20/2017   PROTEIN24HR 257 (H) 11/24/2016    Antenatal Testing CHTN - O10.919  Group I  BP < 140/90, no preeclampsia, AGA,  nml AFV, +/- meds    Group II BP > 140/90, on meds, no preeclampsia, AGA, nml AFV  20-28-34-38  20-24-28-32-35-38  32//2 x wk  28//BPP wkly then 32//2 x wk  40 no meds; 39 meds  PRN or 37  Pre-eclampsia  GHTN - O13.9/Preeclampsia without severe features  - O14.00   Preeclampsia with severe features - O14.10  Q 3-4wks  Q 2 wks  28//BPP wkly then 32//2 x wk  Inpatient  37  PRN or 34         Obesity affecting pregnancy, antepartum 04/04/2018 by Rod Can, CNM No   Overview Signed 07/04/2018  9:50 AM by Will Bonnet, MD    BMI >=  40 [x]  early 1h gtt -  [x]  u/s for dating [x]   [ ]  nutritional goals [ ]  folic acid 1mg  [x]  bASA (>12 weeks) [ ]  consider nutrition consult [ ]  consider maternal EKG 1st trimester [ ]  Growth u/s 28 [ ] , 32 [ ] , 36 weeks [ ]  [ ]  NST/AFI weekly 36+ weeks (36[] , 37[] , 38[] , 39[] , 40[] ) [ ]  IOL by 41 weeks  (scheduled, prn [] ) [ ]  Anesthesia referral p 28 weeks       Family history of congenital heart disease  by Katrina StackWells, Deborah No   Overview Signed 11/18/2016  1:11 PM by Farrel ConnersGutierrez, Colleen, CNM    Seen by genetic counselor. Fetal echo scheduled      Supervision of high risk pregnancy, antepartum 10/12/2016 by Tresea MallGledhill, Jane, CNM No   Overview Addendum 09/20/2018  4:20 PM by Natale MilchSchuman, Christanna R, MD    [facility]inic [facility] Prenatal Labs  Dating 7wk u/s Blood type: B/Positive/-- (07/25 [id number]) B POS  Genetic Screen    NIPT Diploid XY Antibody:Negative (07/25 0952)neg  Anatomic US Incomplete 4/2 Rubella: 1.50 (07/25 0952) Immune Varicella:   immune  GTT Early: normal Third trimester: 107 RPR: Non Reactive (07/25 0952) negative  Rhogam n/a HBsAg: Negative (07/25 0952)   TDaP vaccine  09/20/2018 Flu Shot: 03/07/18 HIV:     Baby Food  Breast                              GBS:   Contraception Considering vasec Pap:10/12/16 NIL  CBB     CS/VBAC NA   Support Person Husband Terry       High risk due to [employer], hx of preeclampsia, morbid obesity, family hx of congenital heart disease, borderline hypothyroidism           Gestational age appropriate obstetric precautions including but not limited to vaginal bleeding, contractions, leaking of fluid and fetal movement were reviewed in detail with the patient.    Return in about 1 week (around 11/15/2018) for ROB/NST/growth ultrasound.  Vena AustriaAndreas Marthann Abshier, MD, Evern CoreFACOG [facility], [facility] 11/08/2018, 2:53 PM

## 2018-11-14 ENCOUNTER — Encounter: Payer: Self-pay | Admitting: Obstetrics & Gynecology

## 2018-11-14 ENCOUNTER — Ambulatory Visit (INDEPENDENT_AMBULATORY_CARE_PROVIDER_SITE_OTHER): Payer: BC Managed Care – PPO | Admitting: Obstetrics & Gynecology

## 2018-11-14 ENCOUNTER — Ambulatory Visit (INDEPENDENT_AMBULATORY_CARE_PROVIDER_SITE_OTHER): Payer: BC Managed Care – PPO

## 2018-11-14 ENCOUNTER — Other Ambulatory Visit: Payer: Self-pay

## 2018-11-14 VITALS — BP 128/80 | Wt 296.0 lb

## 2018-11-14 DIAGNOSIS — Z8639 Personal history of other endocrine, nutritional and metabolic disease: Secondary | ICD-10-CM

## 2018-11-14 DIAGNOSIS — O10913 Unspecified pre-existing hypertension complicating pregnancy, third trimester: Secondary | ICD-10-CM | POA: Diagnosis not present

## 2018-11-14 DIAGNOSIS — I10 Essential (primary) hypertension: Secondary | ICD-10-CM

## 2018-11-14 DIAGNOSIS — Z8279 Family history of other congenital malformations, deformations and chromosomal abnormalities: Secondary | ICD-10-CM

## 2018-11-14 DIAGNOSIS — O99210 Obesity complicating pregnancy, unspecified trimester: Secondary | ICD-10-CM

## 2018-11-14 DIAGNOSIS — Z362 Encounter for other antenatal screening follow-up: Secondary | ICD-10-CM

## 2018-11-14 DIAGNOSIS — O0990 Supervision of high risk pregnancy, unspecified, unspecified trimester: Secondary | ICD-10-CM

## 2018-11-14 DIAGNOSIS — Z3A38 38 weeks gestation of pregnancy: Secondary | ICD-10-CM | POA: Diagnosis not present

## 2018-11-14 DIAGNOSIS — O10919 Unspecified pre-existing hypertension complicating pregnancy, unspecified trimester: Secondary | ICD-10-CM

## 2018-11-14 DIAGNOSIS — O0993 Supervision of high risk pregnancy, unspecified, third trimester: Secondary | ICD-10-CM

## 2018-11-14 LAB — POCT URINALYSIS DIPSTICK OB
Glucose, UA: NEGATIVE
POC,PROTEIN,UA: NEGATIVE

## 2018-11-14 NOTE — Patient Instructions (Addendum)
PRE ADMISSION TESTING For Covid, prior to procedure Monday 9:00-10:00 [facility] entrance (drive up)  Results in 48-54 hours You will not receive notification if test results are negative. If positive for Covid19, your provider will notify you by phone, with additional instructions.   Labor Induction  Labor induction is when steps are taken to cause a pregnant woman to begin the labor process. Most women go into labor on their own between 37 weeks and 42 weeks of pregnancy. When this does not happen or when there is a medical need for labor to begin, steps may be taken to induce labor. Labor induction causes a pregnant woman's uterus to contract. It also causes the cervix to soften (ripen), open (dilate), and thin out (efface). Usually, labor is not induced before 39 weeks of pregnancy unless there is a medical reason to do so. Your health care provider will determine if labor induction is needed. Before inducing labor, your health care provider will consider a number of factors, including:  Your medical condition and your baby's.  How many weeks along you are in your pregnancy.  How mature your baby's lungs are.  The condition of your cervix.  The position of your baby.  The size of your birth canal. What are some reasons for labor induction? Labor may be induced if:  Your health or your baby's health is at risk.  Your pregnancy is overdue by 1 week or more.  Your water breaks but labor does not start on its own.  There is a low amount of amniotic fluid around your baby. You may also choose (elect) to have labor induced at a certain time. Generally, elective labor induction is done no earlier than 39 weeks of pregnancy. What methods are used for labor induction? Methods used for labor induction include:  Prostaglandin medicine. This medicine starts contractions and causes the cervix to dilate and ripen. It can be taken by mouth (orally) or by being inserted into  the vagina (suppository).  Inserting a small, thin tube (catheter) with a balloon into the vagina and then expanding the balloon with water to dilate the cervix.  Stripping the membranes. In this method, your health care provider gently separates amniotic sac tissue from the cervix. This causes the cervix to stretch, which in turn causes the release of a hormone called progesterone. The hormone causes the uterus to contract. This procedure is often done during an office visit, after which you will be sent home to wait for contractions to begin.  Breaking the water. In this method, your health care provider uses a small instrument to make a small hole in the amniotic sac. This eventually causes the amniotic sac to break. Contractions should begin after a few hours.  Medicine to trigger or strengthen contractions. This medicine is given through an IV that is inserted into a vein in your arm. Except for membrane stripping, which can be done in a clinic, labor induction is done in the hospital so that you and your baby can be carefully monitored. How long does it take for labor to be induced? The length of time it takes to induce labor depends on how ready your body is for labor. Some inductions can take up to 2-3 days, while others may take less than a day. Induction may take longer if:  You are induced early in your pregnancy.  It is your first pregnancy.  Your cervix is not ready. What are some risks associated with labor induction? Some risks  associated with labor induction include:  Changes in fetal heart rate, such as being too high, too low, or irregular (erratic).  Failed induction.  Infection in the mother or the baby.  Increased risk of having a cesarean delivery.  Fetal death.  Breaking off (abruption) of the placenta from the uterus (rare).  Rupture of the uterus (very rare). When induction is needed for medical reasons, the benefits of induction generally outweigh the  risks. What are some reasons for not inducing labor? Labor induction should not be done if:  Your baby does not tolerate contractions.  You have had previous surgeries on your uterus, such as a myomectomy, removal of fibroids, or a vertical scar from a previous cesarean delivery.  Your placenta lies very low in your uterus and blocks the opening of the cervix (placenta previa).  Your baby is not in a head-down position.  The umbilical cord drops down into the birth canal in front of the baby.  There are unusual circumstances, such as the baby being very early (premature).  You have had more than 2 previous cesarean deliveries. Summary  Labor induction is when steps are taken to cause a pregnant woman to begin the labor process.  Labor induction causes a pregnant woman's uterus to contract. It also causes the cervix to ripen, dilate, and efface.  Labor is not induced before 39 weeks of pregnancy unless there is a medical reason to do so.  When induction is needed for medical reasons, the benefits of induction generally outweigh the risks. This information is not intended to replace advice given to you by your health care provider. Make sure you discuss any questions you have with your health care provider. Document Released: 08/09/2006 Document Revised: 03/23/2017 Document Reviewed: 05/03/2016 Elsevier Patient Education  2020 [employer].

## 2018-11-14 NOTE — Progress Notes (Signed)
  [facility] INDUCTION ASSESSMENT SCHEDULING Debra Bender Jul 06, 1985 Medical record #: [id number] Phone #:  Home Phone [phone]  Mobile [phone]    Prenatal Provider:[facility] Delivering Group:[facility] Proposed admission date/time:8/19 at 0001 Method of induction:Cytotec  Weight: Filed Weights08/13/20 1317Weight:296 lb (134.3 kg) BMI Body mass index is 50.02 kg/m. HIV Negative HSV Negative EDC Estimated Date of Delivery: 8/26/20based on:US at [redacted] wks  Gestational age on admission: 77 Gravidity/parity:G3P2002  Cervix Score   0 1 2 3   Position Posterior Midposition Anterior   Consistency Firm Medium Soft   Effacement (%) 0-30 40-50 60-70 >80  Dilation (cm) Closed 1-2 3-4 >5  Baby's station -3 -2 -1 +1, +2   Bishop Score:4   Medical induction of labor  select indication(s) below Elective induction ?39 weeks multiparous patient ?39 weeks primiparous patient with Bishop score ?7 ?40 weeks primiparous patient   Medical Indications Adapted from [place] #560, "Medically Indicated Late Preterm and Early Term Deliveries," 2013.  PLACENTAL / UTERINE ISSUES FETAL ISSUES MATERNAL ISSUES  ? Placenta previa (36.0-37.6) ? Isoimmunization (37.0-38.6) ? Preeclampsia without severe features or gestational HTN (37.0)  ? Suspected accreta (34.0-35.6) ? Growth Restriction Nelda Marse[place]) ? Preeclampsia with severe features (34.0)  ? Prior classical CD, uterine window, rupture (36.0-37.6) ? Isolated (38.0-39.6) ? Chronic HTN (38.0-39.6)  ? Prior myomectomy (37.0-38.6) ? Concurrent findings (34.0-37.6) ? Cholestasis (37.0)  ? Umbilical vein varix (26.4) ? Growth Restriction (Twins) ? Diabetes  ? Placental abruption (chronic) ? Di-Di Isolated (36.0-37.6) ? Pregestational, controlled (39.0)  OBSTETRIC ISSUES ? Di-Di concurrent findings (32.0-34.6) ? Pregestational, uncontrolled (37.0-39.0)  ? Postdates ? (41 weeks) ? Mo-Di isolated (32.0-34.6) ?  Pregestational, vascular compromise (37.0- 39.0)  ? PPROM (34.0) ? Multiple Gestation ? Gestational, diet controlled (40.0)  ? Hx of IUFD (39.0 weeks) ? Di-Di (38.0-38.6) ? Gestational, med controlled (39.0)  ? Polyhydramnios, mild/moderate; SDV 8-16 or AFI 25-35 (39.0) ? Mo-Di (36.0-37.6) ? Gestational, uncontrolled (38.0-39.0)  ? Oligohydramnios (36.0-37.6); MVP <2 cm    Provider Signature: Hoyt Koch Scheduled BR:AXENM Edge Date:11/14/2018 1:33 PM   Call [phone] to finalize the induction date/time  [id number] (07/17)

## 2018-11-14 NOTE — Progress Notes (Signed)
  Subjective  Fetal Movement? yes Contractions? no Leaking Fluid? no Vaginal Bleeding? no  Objective  BP 128/80   Wt 296 lb (134.3 kg)   LMP 02/03/2018   BMI 50.02 kg/m  General: NAD Pumonary: no increased work of breathing Abdomen: gravid, non-tender Extremities: no edema Psychiatric: mood appropriate, affect full  Assessment  33 y.o. [id number] at [redacted]w[redacted]d by  11/27/2018, by Ultrasound presenting for routine prenatal visit  Plan   Problem List Items Addressed This Visit      Cardiovascular and Mediastinum   Chronic hypertension affecting pregnancy   Supervision of high risk pregnancy, antepartum    HTN, Obesity w BMI 24 today, Macrosomia   [redacted] weeks gestation of pregnancy         Supervision of high risk pregnancy, antepartum 10/12/2016 by Rod Can, CNM No   Overview Addendum 09/20/2018  4:20 PM by Homero Fellers, MD    Clinic [facility] Prenatal Labs  Dating 7wk u/s Blood type: B/Positive/-- (07/25 0952) B POS  Genetic Screen    NIPT Diploid XY Antibody:Negative (07/25 0952)neg  Anatomic [place] Incomplete 4/2 Rubella: 1.50 (07/25 0952) Immune Varicella:   immune  GTT Early: normal Third trimester: 107 RPR: Non Reactive (07/25 0952) negative  Rhogam n/a HBsAg: Negative (07/25 0952)   TDaP vaccine  09/20/2018 Flu Shot: 03/07/18 HIV:   Neg  Baby Food  Breast                              GBS: neg   Contraception Considering vasec Pap:10/12/16 NIL  CBB  No   CS/VBAC NA   Support Person Husband Terry       High risk due to [place], hx of preeclampsia, morbid obesity, family hx of congenital heart disease, borderline hypothyroidism        Review of ULTRASOUND.    I have personally reviewed images and report of recent ultrasound done at [facility].    Plan of management to be discussed with patient.    Macrosomia noted, discussed.  Pt has tested pelvis and does not have dx of GDM.  PLAN: NST again Monday, also perform cervical check and H&P for IOL    IOL sch for Wed (8/19,  to come in at [place] Tues-Wed).    [place] testing Monday  Barnett Applebaum, MD, [facility], [facility] 11/14/2018  1:42 PM

## 2018-11-18 ENCOUNTER — Encounter: Payer: Self-pay | Admitting: Maternal Newborn

## 2018-11-18 ENCOUNTER — Ambulatory Visit (INDEPENDENT_AMBULATORY_CARE_PROVIDER_SITE_OTHER): Payer: BC Managed Care – PPO | Admitting: Maternal Newborn

## 2018-11-18 ENCOUNTER — Ambulatory Visit
Admission: RE | Admit: 2018-11-18 | Discharge: 2018-11-18 | Disposition: A | Discharge status: Home / Self Care | Payer: BC Managed Care – PPO | Source: Ambulatory Visit | Attending: Maternal Newborn | Admitting: Maternal Newborn

## 2018-11-18 ENCOUNTER — Other Ambulatory Visit: Payer: Self-pay

## 2018-11-18 VITALS — BP 128/80 | Wt 299.0 lb

## 2018-11-18 DIAGNOSIS — Z3A38 38 weeks gestation of pregnancy: Secondary | ICD-10-CM

## 2018-11-18 DIAGNOSIS — Z20828 Contact with and (suspected) exposure to other viral communicable diseases: Secondary | ICD-10-CM | POA: Insufficient documentation

## 2018-11-18 DIAGNOSIS — O10913 Unspecified pre-existing hypertension complicating pregnancy, third trimester: Secondary | ICD-10-CM

## 2018-11-18 DIAGNOSIS — O0990 Supervision of high risk pregnancy, unspecified, unspecified trimester: Secondary | ICD-10-CM

## 2018-11-18 DIAGNOSIS — Z01812 Encounter for preprocedural laboratory examination: Secondary | ICD-10-CM | POA: Insufficient documentation

## 2018-11-18 LAB — SARS CORONAVIRUS 2 (TAT 6-24 HRS): SARS Coronavirus 2: NEGATIVE

## 2018-11-18 NOTE — Progress Notes (Signed)
Obstetrics Admission History & Physical   Planned induction of labor at term  HPI:  33 y.o. [id number] @ 2665w5d (11/27/2018, by Ultrasound). Admitted on (Not on file):   Patient Active Problem List   Diagn30osis Date Noted  . Chronic hypertension affecting pregnancy 07/04/2018  . BMI 45.0-49.9, adult (HCC) 04/04/2018  . Obesity affecting pregnancy, antepartum 04/04/2018  . Family history of congenital heart disease   . Supervision of high risk pregnancy, antepartum 10/12/2016  . Gastroesophageal reflux disease without esophagitis 04/17/2016  . Essential hypertension 02/16/2016  . History of thyroid disorder 11/01/2015  . Hypothyroidism 02/08/2015     Presents for H&P visit for induction of labor. She is having occasional mild contractions. Baby is moving well. She is not having any loss of fluid or vaginal bleeding. She has no other symptoms at this time.   [facility]. Pregnancy complicated by chronic HTN, obesity with BMI >45, hypothyroidism.  ROS: A review of systems was performed and negative, except as stated in the above HPI.  PMHx:  Past Medical History:  Diagnosis Date  . Anemia 12/2014  . GERD (gastroesophageal reflux disease)   . Hypertension   . Hypothyroidism   . Obesity affecting pregnancy    PSHx:  Past Surgical History:  Procedure Laterality Date  . FRACTURE SURGERY Right 33 years old   Medications:  Outpatient Encounter Medications as of 11/18/2018  Medication Sig  . Acetaminophen (MAPAP) 500 MG coapsule Take by mouth.  Marland Kitchen. amLODipine (NORVASC) 5 MG tablet Take 1 tablet (5 mg total) by mouth 2 (two) times a day.  . Butalbital-APAP-Caffeine 50-325-40 MG capsule Take 1-2 capsules by mouth every 6 (six) hours as needed for headache.  . famotidine (PEPCID) 20 MG tablet Take by mouth.  . folic acid (FOLVITE) 1 MG tablet Take 1 tablet (1 mg total) by mouth daily.  . Prenatal Vit-Fe Fumarate-FA (PRENATAL MULTIVITAMIN) TABS tablet Take 1 tablet by mouth  daily at 12 noon.  . prochlorperazine (COMPAZINE) 10 MG tablet TAKE 1 TABLET (10 MG TOTAL) BY MOUTH EVERY 6 (SIX) HOURS AS NEEDED (HEADACHE).   No facility-administered encounter medications on file as of 11/18/2018.    Allergies: is allergic to salmon [fish allergy]; hydrochlorothiazide; and latex.   OBHx:  OB History  Gravida Para Term Preterm AB Living  3 2 2     2   SAB TAB Ectopic Multiple Live Births        0 2    # Outcome Date GA Lbr Len/2nd Weight Sex Delivery Anes PTL Lv  3 Current           2 Term 05/21/17 245w1d 07:09 / 00:04 8 lb 13.8 oz (4.02 kg) F Vag-Spont EPI, Local  LIV  1 Term 02/10/15 587w2d 08:17 / 02:11 6 lb 11.8 oz (3.055 kg) M Vag-Spont EPI  LIV     Complications: Severe preeclampsia   Family History  Problem Relation Age of Onset  . Depression Mother   . Heart disease Father   . Asthma Brother   . Emphysema Maternal Grandmother   . Diabetes Maternal Grandfather   . Cirrhosis Paternal Grandmother   . Heart disease Paternal Grandfather    Soc Hx: Former smoker, Alcohol: none and Recreational drug use: none  Objective:   Vitals:   11/18/18 1011  BP: 128/80   Constitutional: Well nourished, well developed female in no acute distress.  HEENT: normal Skin: Warm and dry.  Cardiovascular: Regular rate and rhythm.   Extremity:  trace to 1+ bilateral pedal edema Respiratory: Clear to auscultation bilaterally. Normal respiratory effort Abdomen: gravid, non-tender Neuro: Cranial nerves grossly intact Psych: Alert and Oriented x3. No memory deficits. Normal mood and affect.  MS: normal gait, normal bilateral lower extremity ROM/strength/stability.  Pelvic exam: is limited by body habitus External Genitalia, Bartholin's glands, Urethra, Skene's glands: within normal limits Vagina: within normal limits and with normal mucosa; no blood in the vault Cervix: 0/20/-3  EFM: Reactive NST, baseline 125 bpm Toco: Not done   Perinatal info:  Blood type: B  positive Rubella - Immune Varicella - Immune TDaP Given during third trimester of this pregnancy on 09/20/2018 RPR NR / HIV Neg/ HBsAg Neg   Assessment & Plan:   33 y.o. [id number] @ [redacted]w[redacted]d for planned induction of labor at term    Begin induction with Cytotec. Observe for cervical change, Epidural when ready, AROM when Appropriate and GBS status negative, treat as needed  Avel Sensor, [facility] 11/18/2018  10:55 AM

## 2018-11-18 NOTE — Patient Instructions (Signed)

## 2018-11-18 NOTE — Progress Notes (Signed)
Routine Prenatal Care Visit  Subjective  Debra Bender is a 33 y.o. G3P2002 at 6585w5d being seen today for ongoing prenatal care.  She is currently monitored for the following issues for this high-risk pregnancy and has Hypothyroidism; History of thyroid disorder; Gastroesophageal reflux disease without esophagitis; Essential hypertension; Supervision of high risk pregnancy, antepartum; Family history of congenital heart disease; BMI 45.0-49.9, adult (HCC); Obesity affecting pregnancy, antepartum; and Chronic hypertension affecting pregnancy on their problem list.  ----------------------------------------------------------------------------------- Patient reports occasional contractions.   Contractions: Irregular. Vag. Bleeding: None.  Movement: Present. No leaking of fluid.  ----------------------------------------------------------------------------------- The following portions of the patient's history were reviewed and updated as appropriate: allergies, current medications, past family history, past medical history, past social history, past surgical history and problem list. Problem list updated.   Objective  Blood pressure 128/80, weight 299 lb (135.6 kg), last menstrual period 02/03/2018, currently breastfeeding. Pregravid weight 270 lb (122.5 kg) Total Weight Gain 29 lb (13.2 kg)  Fetal Status: Fetal Heart Rate (bpm): 130   Movement: Present     General:  Alert, oriented and cooperative. Patient is in no acute distress.  Skin: Skin is warm and dry. No rash noted.   Cardiovascular: Normal heart rate noted  Respiratory: Normal respiratory effort, no problems with respiration noted  Abdomen: Soft, gravid, appropriate for gestational age. Pain/Pressure: Present     Pelvic:  Cervical exam performed Dilation: Closed Effacement (%): 20 Station: -3  Extremities: Normal range of motion.  Edema: None  Mental Status: Normal mood and affect. Normal behavior. Normal judgment and thought  content.   NST Baseline: 125 Variability: moderate Accelerations: present Decelerations: absent Tocometry: not done The patient was monitored for 20 minutes, fetal heart rate tracing was deemed reactive.  Assessment   33 y.o. [id number] at 9285w5d, EDD 11/27/2018 by Ultrasound presenting for a routine prenatal visit.  Plan   pregnancy 3 Problems (from 02/03/18 to present)    Problem Noted Resolved   Chronic hypertension affecting pregnancy 07/04/2018 by Conard NovakJackson, Stephen D, MD No   Overview Addendum 07/04/2018  9:49 AM by Conard NovakJackson, Stephen D, MD    [x]  Aspirin 81 mg daily after 12 weeks; discontinue after 36 weeks [x]  baseline labs with CBC, CMP, urine protein/creatinine ratio [ ]  no BP meds unless BPs become elevated [ ]  ultrasound for growth at 28, 32, 36 weeks [x]  Aspirin 81 mg daily after 12 weeks; discontinue after 36 weeks [ ]  Baseline EKG   Current antihypertensives:  amlodipine   Baseline and surveillance labs ([facility], refresh links as needed)  Lab Results  Component Value Date   PLT 304 04/16/2018   CREATININE 0.76 04/16/2018   AST 11 04/16/2018   ALT 11 04/16/2018   PROTCRRATIO 0.09 05/20/2017   PROTEIN24HR 257 (H) 11/24/2016    Antenatal Testing CHTN - O10.919  Group I  BP < 140/90, no preeclampsia, AGA,  nml AFV, +/- meds    Group II BP > 140/90, on meds, no preeclampsia, AGA, nml AFV  20-28-34-38  20-24-28-32-35-38  32//2 x wk  28//BPP wkly then 32//2 x wk  40 no meds; 39 meds  PRN or 37  Pre-eclampsia  GHTN - O13.9/Preeclampsia without severe features  - O14.00   Preeclampsia with severe features - O14.10  Q 3-4wks  Q 2 wks  28//BPP wkly then 32//2 x wk  Inpatient  37  PRN or 34         Obesity affecting pregnancy, antepartum 04/04/2018 by  Rod Can, CNM No   Overview Signed 07/04/2018  9:50 AM by Will Bonnet, MD    BMI >=40 [x]  early 1h gtt -  [x]  u/s for dating [x]   [ ]  nutritional goals [ ]  folic acid 1mg  [x]  bASA  (>12 weeks) [ ]  consider nutrition consult [ ]  consider maternal EKG 1st trimester [ ]  Growth u/s 28 [ ] , 32 [ ] , 36 weeks [ ]  [ ]  NST/AFI weekly 36+ weeks (36[] , 37[] , 38[] , 39[] , 40[] ) [ ]  IOL by 41 weeks (scheduled, prn [] ) [ ]  Anesthesia referral p 28 weeks       Family history of congenital heart disease  by Donette Larry No   Overview Signed 11/18/2016  1:11 PM by Dalia Heading, CNM    Seen by genetic counselor. Fetal echo scheduled      Supervision of high risk pregnancy, antepartum 10/12/2016 by Rod Can, CNM No   Overview Addendum 09/20/2018  4:20 PM by Homero Fellers, MD    [facility] Prenatal Labs  Dating 7wk u/s Blood type: B/Positive/-- (07/25 9629) B POS  Genetic Screen    NIPT Diploid XY Antibody:Negative (07/25 0952)neg  Anatomic [place] Incomplete 4/2 Rubella: 1.50 (07/25 0952) Immune Varicella:   immune  GTT Early: normal Third trimester: 107 RPR: Non Reactive (07/25 0952) negative  Rhogam n/a HBsAg: Negative (07/25 0952)   TDaP vaccine  09/20/2018 Flu Shot: 03/07/18 HIV:     Baby Food  Breast                              GBS:   Contraception Considering vasec Pap:10/12/16 NIL  CBB     CS/VBAC NA   Support Person Husband Terry       High risk due to [employer], hx of preeclampsia, morbid obesity, family hx of congenital heart disease, borderline hypothyroidism           Please refer to After Visit Summary for other counseling recommendations.   Induction of labor scheduled for midnight on 11/20/2018.  Avel Sensor, CNM 11/18/2018  11:12 AM

## 2018-11-20 ENCOUNTER — Inpatient Hospital Stay: Payer: BC Managed Care – PPO | Admitting: Anesthesiology

## 2018-11-20 ENCOUNTER — Other Ambulatory Visit: Payer: Self-pay

## 2018-11-20 ENCOUNTER — Encounter
Admission: EM | Disposition: A | Discharge status: Home / Self Care | Payer: Self-pay | Source: Home / Self Care | Attending: Obstetrics & Gynecology

## 2018-11-20 ENCOUNTER — Inpatient Hospital Stay
Admission: EM | Admit: 2018-11-20 | Discharge: 2018-11-23 | DRG: 787 | Disposition: A | Discharge status: Home / Self Care | Payer: BC Managed Care – PPO | Attending: Obstetrics & Gynecology | Admitting: Obstetrics & Gynecology

## 2018-11-20 DIAGNOSIS — O339 Maternal care for disproportion, unspecified: Secondary | ICD-10-CM | POA: Diagnosis present

## 2018-11-20 DIAGNOSIS — Z3A39 39 weeks gestation of pregnancy: Secondary | ICD-10-CM

## 2018-11-20 DIAGNOSIS — O3663X0 Maternal care for excessive fetal growth, third trimester, not applicable or unspecified: Secondary | ICD-10-CM | POA: Diagnosis present

## 2018-11-20 DIAGNOSIS — Z8279 Family history of other congenital malformations, deformations and chromosomal abnormalities: Secondary | ICD-10-CM

## 2018-11-20 DIAGNOSIS — O10919 Unspecified pre-existing hypertension complicating pregnancy, unspecified trimester: Secondary | ICD-10-CM

## 2018-11-20 DIAGNOSIS — D62 Acute posthemorrhagic anemia: Secondary | ICD-10-CM | POA: Diagnosis not present

## 2018-11-20 DIAGNOSIS — O99284 Endocrine, nutritional and metabolic diseases complicating childbirth: Secondary | ICD-10-CM | POA: Diagnosis not present

## 2018-11-20 DIAGNOSIS — O1002 Pre-existing essential hypertension complicating childbirth: Principal | ICD-10-CM | POA: Diagnosis present

## 2018-11-20 DIAGNOSIS — Z3490 Encounter for supervision of normal pregnancy, unspecified, unspecified trimester: Secondary | ICD-10-CM | POA: Diagnosis present

## 2018-11-20 DIAGNOSIS — O99213 Obesity complicating pregnancy, third trimester: Secondary | ICD-10-CM

## 2018-11-20 DIAGNOSIS — O10913 Unspecified pre-existing hypertension complicating pregnancy, third trimester: Secondary | ICD-10-CM

## 2018-11-20 DIAGNOSIS — O1092 Unspecified pre-existing hypertension complicating childbirth: Secondary | ICD-10-CM | POA: Diagnosis not present

## 2018-11-20 DIAGNOSIS — Z87891 Personal history of nicotine dependence: Secondary | ICD-10-CM

## 2018-11-20 DIAGNOSIS — O99214 Obesity complicating childbirth: Secondary | ICD-10-CM | POA: Diagnosis present

## 2018-11-20 DIAGNOSIS — O9081 Anemia of the puerperium: Secondary | ICD-10-CM | POA: Diagnosis not present

## 2018-11-20 DIAGNOSIS — O0990 Supervision of high risk pregnancy, unspecified, unspecified trimester: Secondary | ICD-10-CM

## 2018-11-20 DIAGNOSIS — O99283 Endocrine, nutritional and metabolic diseases complicating pregnancy, third trimester: Secondary | ICD-10-CM | POA: Diagnosis not present

## 2018-11-20 DIAGNOSIS — O99210 Obesity complicating pregnancy, unspecified trimester: Secondary | ICD-10-CM

## 2018-11-20 LAB — CBC
HCT: 32.6 % — ABNORMAL LOW (ref 36.0–46.0)
Hemoglobin: 10.6 g/dL — ABNORMAL LOW (ref 12.0–15.0)
MCH: 27.1 pg (ref 26.0–34.0)
MCHC: 32.5 g/dL (ref 30.0–36.0)
MCV: 83.4 fL (ref 80.0–100.0)
Platelets: 227 10*3/uL (ref 150–400)
RBC: 3.91 MIL/uL (ref 3.87–5.11)
RDW: 15.5 % (ref 11.5–15.5)
WBC: 13.3 10*3/uL — ABNORMAL HIGH (ref 4.0–10.5)
nRBC: 0 % (ref 0.0–0.2)

## 2018-11-20 LAB — TYPE AND SCREEN
ABO/RH(D): B POS
Antibody Screen: NEGATIVE

## 2018-11-20 SURGERY — Surgical Case
Anesthesia: Epidural | Site: Abdomen

## 2018-11-20 MED ORDER — MORPHINE SULFATE (PF) 0.5 MG/ML IJ SOLN
INTRAMUSCULAR | Status: DC | PRN
  Administered 2018-11-20: .1 mg via INTRATHECAL

## 2018-11-20 MED ORDER — ONDANSETRON HCL 4 MG/2ML IJ SOLN
INTRAMUSCULAR | Status: DC | PRN
  Administered 2018-11-20: 4 mg via INTRAVENOUS

## 2018-11-20 MED ORDER — SODIUM CHLORIDE 0.9 % IV SOLN
2.0000 g | INTRAVENOUS | Status: AC
  Administered 2018-11-20: 23:00:00 2 g via INTRAVENOUS

## 2018-11-20 MED ORDER — FENTANYL CITRATE (PF) 100 MCG/2ML IJ SOLN
INTRAMUSCULAR | Status: AC
  Filled 2018-11-20: qty 2

## 2018-11-20 MED ORDER — ROPIVACAINE HCL 2 MG/ML IJ SOLN
INTRAMUSCULAR | Status: AC
  Filled 2018-11-20: qty 100

## 2018-11-20 MED ORDER — SOD CITRATE-CITRIC ACID 500-334 MG/5ML PO SOLN
ORAL | Status: AC
  Administered 2018-11-20: 30 mL
  Filled 2018-11-20: qty 15

## 2018-11-20 MED ORDER — SODIUM CHLORIDE 0.9 % IV SOLN
INTRAVENOUS | Status: DC | PRN
  Administered 2018-11-20: 45 ug/min via INTRAVENOUS

## 2018-11-20 MED ORDER — BUPIVACAINE HCL 0.5 % IJ SOLN
INTRAMUSCULAR | Status: DC | PRN
  Administered 2018-11-20: 10 mL

## 2018-11-20 MED ORDER — LACTATED RINGERS IV SOLN: INTRAVENOUS | Status: DC

## 2018-11-20 MED ORDER — FAMOTIDINE 20 MG PO TABS
20.0000 mg | ORAL_TABLET | Freq: Two times a day (BID) | ORAL | Status: DC
  Administered 2018-11-20 – 2018-11-23 (×6): 20 mg via ORAL
  Filled 2018-11-20 (×6): qty 1

## 2018-11-20 MED ORDER — PHENYLEPHRINE 40 MCG/ML (10ML) SYRINGE FOR IV PUSH (FOR BLOOD PRESSURE SUPPORT): 80.0000 ug | PREFILLED_SYRINGE | INTRAVENOUS | Status: DC | PRN

## 2018-11-20 MED ORDER — FENTANYL 2.5 MCG/ML W/ROPIVACAINE 0.15% IN NS 100 ML EPIDURAL (ARMC)
12.0000 mL/h | EPIDURAL | Status: DC
  Administered 2018-11-20: 19:00:00 12 mL/h via EPIDURAL

## 2018-11-20 MED ORDER — LACTATED RINGERS IV SOLN
INTRAVENOUS | Status: DC
  Administered 2018-11-20 (×3): via INTRAVENOUS

## 2018-11-20 MED ORDER — FENTANYL CITRATE (PF) 100 MCG/2ML IJ SOLN
INTRAMUSCULAR | Status: DC | PRN
  Administered 2018-11-20: 15 ug via INTRATHECAL
  Administered 2018-11-20: 100 ug via EPIDURAL
  Administered 2018-11-21: 50 ug via INTRAVENOUS

## 2018-11-20 MED ORDER — LACTATED RINGERS IV SOLN
500.0000 mL | INTRAVENOUS | Status: DC | PRN
  Administered 2018-11-20: 500 mL via INTRAVENOUS

## 2018-11-20 MED ORDER — BUPIVACAINE 0.25 % ON-Q PUMP DUAL CATH 400 ML
400.0000 mL | INJECTION | Status: DC
  Filled 2018-11-20: qty 400

## 2018-11-20 MED ORDER — EPHEDRINE 5 MG/ML INJ: 10.0000 mg | INTRAVENOUS | Status: DC | PRN

## 2018-11-20 MED ORDER — BUPIVACAINE HCL (PF) 0.5 % IJ SOLN
INTRAMUSCULAR | Status: AC
  Filled 2018-11-20: qty 30

## 2018-11-20 MED ORDER — FENTANYL CITRATE (PF) 100 MCG/2ML IJ SOLN
50.0000 ug | INTRAMUSCULAR | Status: DC | PRN
  Administered 2018-11-20: 17:00:00 100 ug via INTRAVENOUS
  Administered 2018-11-20: 13:00:00 50 ug via INTRAVENOUS
  Administered 2018-11-20: 100 ug via INTRAVENOUS
  Filled 2018-11-20 (×3): qty 2

## 2018-11-20 MED ORDER — OXYTOCIN 40 UNITS IN NORMAL SALINE INFUSION - SIMPLE MED
2.5000 [IU]/h | INTRAVENOUS | Status: DC
  Administered 2018-11-21: 2.5 [IU]/h via INTRAVENOUS
  Filled 2018-11-20: qty 1000

## 2018-11-20 MED ORDER — FENTANYL 2.5 MCG/ML W/ROPIVACAINE 0.15% IN NS 100 ML EPIDURAL (ARMC)
EPIDURAL | Status: AC
  Filled 2018-11-20: qty 100

## 2018-11-20 MED ORDER — ACETAMINOPHEN 325 MG PO TABS: 650.0000 mg | ORAL_TABLET | ORAL | Status: DC | PRN

## 2018-11-20 MED ORDER — SODIUM CHLORIDE 0.9 % IV SOLN
INTRAVENOUS | Status: DC | PRN
  Administered 2018-11-20 (×3): 5 mL via EPIDURAL

## 2018-11-20 MED ORDER — SODIUM CHLORIDE 0.9 % IV SOLN
INTRAVENOUS | Status: AC
  Filled 2018-11-20: qty 2

## 2018-11-20 MED ORDER — MISOPROSTOL 200 MCG PO TABS
ORAL_TABLET | ORAL | Status: AC
  Filled 2018-11-20: qty 4

## 2018-11-20 MED ORDER — AMMONIA AROMATIC IN INHA
RESPIRATORY_TRACT | Status: AC
  Filled 2018-11-20: qty 10

## 2018-11-20 MED ORDER — AMLODIPINE BESYLATE 5 MG PO TABS
5.0000 mg | ORAL_TABLET | Freq: Every day | ORAL | Status: DC
  Administered 2018-11-20 – 2018-11-23 (×4): 5 mg via ORAL
  Filled 2018-11-20 (×5): qty 1

## 2018-11-20 MED ORDER — LIDOCAINE HCL (PF) 1 % IJ SOLN
INTRAMUSCULAR | Status: DC | PRN
  Administered 2018-11-20: 2 mL

## 2018-11-20 MED ORDER — LIDOCAINE HCL (PF) 1 % IJ SOLN
30.0000 mL | INTRAMUSCULAR | Status: DC | PRN
  Filled 2018-11-20 (×2): qty 30

## 2018-11-20 MED ORDER — DIPHENHYDRAMINE HCL 50 MG/ML IJ SOLN
12.5000 mg | INTRAMUSCULAR | Status: DC | PRN
  Administered 2018-11-20: 12.5 mg via INTRAVENOUS
  Filled 2018-11-20: qty 1

## 2018-11-20 MED ORDER — MISOPROSTOL 100 MCG PO TABS
25.0000 ug | ORAL_TABLET | ORAL | Status: DC | PRN
  Administered 2018-11-20 (×2): 25 ug via VAGINAL
  Filled 2018-11-20 (×3): qty 1

## 2018-11-20 MED ORDER — OXYTOCIN 40 UNITS IN NORMAL SALINE INFUSION - SIMPLE MED
1.0000 m[IU]/min | INTRAVENOUS | Status: DC
  Administered 2018-11-20: 1 m[IU]/min via INTRAVENOUS
  Administered 2018-11-20: 1 mL via INTRAVENOUS
  Administered 2018-11-21: 699 mL via INTRAVENOUS
  Filled 2018-11-20 (×2): qty 1000

## 2018-11-20 MED ORDER — MORPHINE SULFATE (PF) 0.5 MG/ML IJ SOLN
INTRAMUSCULAR | Status: AC
  Filled 2018-11-20: qty 10

## 2018-11-20 MED ORDER — OXYTOCIN 10 UNIT/ML IJ SOLN
INTRAMUSCULAR | Status: AC
  Filled 2018-11-20: qty 2

## 2018-11-20 MED ORDER — TERBUTALINE SULFATE 1 MG/ML IJ SOLN: 0.2500 mg | Freq: Once | INTRAMUSCULAR | Status: DC | PRN

## 2018-11-20 MED ORDER — PROPOFOL 10 MG/ML IV BOLUS
INTRAVENOUS | Status: AC
  Filled 2018-11-20: qty 20

## 2018-11-20 MED ORDER — SOD CITRATE-CITRIC ACID 500-334 MG/5ML PO SOLN
ORAL | Status: AC
  Filled 2018-11-20: qty 15

## 2018-11-20 MED ORDER — BUPIVACAINE HCL 0.5 % IJ SOLN
10.0000 mL | Freq: Once | INTRAMUSCULAR | Status: DC
  Filled 2018-11-20: qty 10

## 2018-11-20 MED ORDER — SOD CITRATE-CITRIC ACID 500-334 MG/5ML PO SOLN: 30.0000 mL | ORAL | Status: DC

## 2018-11-20 MED ORDER — ONDANSETRON HCL 4 MG/2ML IJ SOLN: 4.0000 mg | Freq: Four times a day (QID) | INTRAMUSCULAR | Status: DC | PRN

## 2018-11-20 MED ORDER — LACTATED RINGERS IV SOLN
500.0000 mL | Freq: Once | INTRAVENOUS | Status: AC
  Administered 2018-11-20: 23:00:00 via INTRAVENOUS

## 2018-11-20 MED ORDER — OXYTOCIN BOLUS FROM INFUSION: 500.0000 mL | Freq: Once | INTRAVENOUS | Status: DC

## 2018-11-20 MED ORDER — BUPIVACAINE IN DEXTROSE 0.75-8.25 % IT SOLN
INTRATHECAL | Status: DC | PRN
  Administered 2018-11-20: 9 mL via INTRATHECAL

## 2018-11-20 MED ORDER — SODIUM CHLORIDE 0.9 % IV SOLN
INTRAVENOUS | Status: DC | PRN
  Administered 2018-11-20: 23:00:00 25 ug via INTRAVENOUS

## 2018-11-20 SURGICAL SUPPLY — 35 items
BULB RESERV EVAC DRAIN JP 100C (MISCELLANEOUS) IMPLANT
CANISTER SUCT 3000ML PPV (MISCELLANEOUS) ×3 IMPLANT
CATH KIT ON-Q SILVERSOAK 5IN (CATHETERS) ×6 IMPLANT
CHLORAPREP W/TINT 26 (MISCELLANEOUS) ×6 IMPLANT
COVER WAND RF STERILE (DRAPES) IMPLANT
DERMABOND ADVANCED (GAUZE/BANDAGES/DRESSINGS) ×2
DERMABOND ADVANCED .7 DNX12 (GAUZE/BANDAGES/DRESSINGS) ×1 IMPLANT
DRAIN CHANNEL JP 15F RND 16 (MISCELLANEOUS) IMPLANT
DRESSING SURGICEL FIBRLLR 1X2 (HEMOSTASIS) ×1 IMPLANT
DRSG SURGICEL FIBRILLAR 1X2 (HEMOSTASIS) ×3
ELECT CAUTERY BLADE 6.4 (BLADE) ×3 IMPLANT
ELECT REM PT RETURN 9FT ADLT (ELECTROSURGICAL) ×3
ELECTRODE REM PT RTRN 9FT ADLT (ELECTROSURGICAL) ×1 IMPLANT
GLOVE BIOGEL M STRL SZ7.5 (GLOVE) ×6 IMPLANT
GLOVE SKINSENSE NS SZ6.5 (GLOVE) ×4
GLOVE SKINSENSE NS SZ8.0 LF (GLOVE) ×12
GLOVE SKINSENSE STRL SZ6.5 (GLOVE) ×2 IMPLANT
GLOVE SKINSENSE STRL SZ8.0 LF (GLOVE) ×6 IMPLANT
GOWN STRL REUS W/ TWL LRG LVL3 (GOWN DISPOSABLE) ×1 IMPLANT
GOWN STRL REUS W/ TWL XL LVL3 (GOWN DISPOSABLE) ×2 IMPLANT
GOWN STRL REUS W/TWL LRG LVL3 (GOWN DISPOSABLE) ×2
GOWN STRL REUS W/TWL XL LVL3 (GOWN DISPOSABLE) ×4
NS IRRIG 1000ML POUR BTL (IV SOLUTION) ×3 IMPLANT
PACK C SECTION AR (MISCELLANEOUS) ×3 IMPLANT
PAD OB MATERNITY 4.3X12.25 (PERSONAL CARE ITEMS) ×3 IMPLANT
PAD PREP 24X41 OB/GYN DISP (PERSONAL CARE ITEMS) ×3 IMPLANT
PENCIL SMOKE ULTRAEVAC 22 CON (MISCELLANEOUS) ×3 IMPLANT
RETRACTOR TRAXI PANNICULUS (MISCELLANEOUS) ×1 IMPLANT
SPONGE LAP 18X18 RF (DISPOSABLE) ×18 IMPLANT
SUT MAXON ABS #0 GS21 30IN (SUTURE) ×6 IMPLANT
SUT PLAIN 2 0 XLH (SUTURE) ×3 IMPLANT
SUT VIC AB 1 CT1 36 (SUTURE) ×27 IMPLANT
SUT VIC AB 2-0 CT1 36 (SUTURE) IMPLANT
SUT VIC AB 4-0 FS2 27 (SUTURE) ×3 IMPLANT
TRAXI PANNICULUS RETRACTOR (MISCELLANEOUS) ×2

## 2018-11-20 NOTE — Progress Notes (Signed)
Subjective:  Comfortable, resting in bed. Would like to eat some food before starting next stage of induction.  Objective:   Vitals: Blood pressure 133/79, pulse 83, temperature 98.6 F (37 C), temperature source Oral, resp. rate 16, height 5' 4.5" (1.638 m), weight 135.6 kg, last menstrual period 02/03/2018  General: NAD Abdomen: gravid, non-tender Cervical Exam:  Dilation: 3.5 Effacement (%): 50 Cervical Position: Posterior Station: -2, -3 Exam by:: Lashawn Orrego CNM  FHT: Baseline 135 bpm, moderate variability, accelerations present, decelerations absent. Toco: Contractions every 1/5-5 minutes, Duration 40-80 seconds, Intensity mild  Results for orders placed or performed during the hospital encounter of 11/20/18 (from the past 24 hour(s))  CBC     Status: Abnormal   Collection Time: 11/20/18  1:18 AM  Result Value Ref Range   WBC 13.3 (H) 4.0 - 10.5 K/uL   RBC 3.91 3.87 - 5.11 MIL/uL   Hemoglobin 10.6 (L) 12.0 - 15.0 g/dL   HCT 32.6 (L) 36.0 - 46.0 %   MCV 83.4 80.0 - 100.0 fL   MCH 27.1 26.0 - 34.0 pg   MCHC 32.5 30.0 - 36.0 g/dL   RDW 15.5 11.5 - 15.5 %   Platelets 227 150 - 400 K/uL   nRBC 0.0 0.0 - 0.2 %  Type and screen     Status: None   Collection Time: 11/20/18  1:49 AM  Result Value Ref Range   ABO/RH(D) B POS    Antibody Screen NEG    Sample Expiration      11/23/2018,2359 Performed at [facility], [address] [place], [place] [address]     Assessment:   33 y.o. [id number] [redacted]w[redacted]d induction of labor for cHTN  Plan:   1) Labor - Begin Pitocin after patient has had a meal  2) Fetus - Category I EFM.  Avel Sensor, CNM 11/20/2018  10:23 AM

## 2018-11-20 NOTE — Anesthesia Procedure Notes (Signed)
Epidural Patient location during procedure: OB Start time: 11/20/2018 6:14 PM End time: 11/20/2018 6:31 PM  Staffing Anesthesiologist: Durenda Hurt, MD  Preanesthetic Checklist Completed: patient identified, site marked, surgical consent, pre-op evaluation, timeout performed, IV checked, risks and benefits discussed and monitors and equipment checked  Epidural Patient position: sitting Prep: ChloraPrep Patient monitoring: heart rate, continuous pulse ox and blood pressure Approach: midline Location: L4-L5 Injection technique: LOR saline  Needle:  Needle type: Tuohy  Needle gauge: 18 G Needle length: 9 cm and 9 Needle insertion depth: 8.5 cm Catheter type: closed end flexible Catheter size: 20 Guage Catheter at skin depth: 13.5 cm Test dose: negative and Other  Assessment Events: blood not aspirated, injection not painful, no injection resistance, negative IV test and no paresthesia  Additional Notes Negative aspiration. Negative paresthesia on injection.   Dose given in divided aliquots.  Patient tolerated the insertion well without complications.Reason for block:procedure for pain

## 2018-11-20 NOTE — Anesthesia Preprocedure Evaluation (Signed)
Anesthesia Evaluation  Patient identified by MRN, date of birth, ID band Patient awake    Reviewed: Allergy & Precautions, H&P , NPO status , Patient's Chart, lab work & pertinent test results  Airway Mallampati: II  TM Distance: >3 FB Neck ROM: full    Dental  (+) Chipped   Pulmonary neg pulmonary ROS, former smoker,           Cardiovascular Exercise Tolerance: Good hypertension (chronic HTN on amlodipine), On Medications      Neuro/Psych    GI/Hepatic GERD  ,  Endo/Other  Hypothyroidism Morbid obesity (BMI 50)  Renal/GU   negative genitourinary   Musculoskeletal   Abdominal   Peds  Hematology  (+) Blood dyscrasia, anemia ,   Anesthesia Other Findings Past Medical History: 12/2014: Anemia No date: GERD (gastroesophageal reflux disease) No date: Hypertension No date: Hypothyroidism No date: Obesity affecting pregnancy  Past Surgical History: 33 years old: FRACTURE SURGERY; Right  BMI    Body Mass Index: 50.53 kg/m      Reproductive/Obstetrics (+) Pregnancy                             Anesthesia Physical Anesthesia Plan  ASA: III  Anesthesia Plan: Epidural   Post-op Pain Management:    Induction:   PONV Risk Score and Plan:   Airway Management Planned:   Additional Equipment:   Intra-op Plan:   Post-operative Plan:   Informed Consent: I have reviewed the patients History and Physical, chart, labs and discussed the procedure including the risks, benefits and alternatives for the proposed anesthesia with the patient or authorized representative who has indicated his/her understanding and acceptance.       Plan Discussed with: Anesthesiologist  Anesthesia Plan Comments:         Anesthesia Quick Evaluation

## 2018-11-20 NOTE — H&P (Addendum)
Obstetric H&P   Chief Complaint: IOL CHTN  Prenatal Care Provider: [facility]  History of Present Illness: 33 y.o. L2G4010 [redacted]w[redacted]d by 11/27/2018, by Ultrasound presenting to L&D for scheduled induction of labor secondary to [facility] on norvasc this pregnancy.  Pregnancy also complicated by obesity BMI Body mass index is 50.53 kg/m. and hypothyroidism.   Reports +FM, no LOF, no VB, no contractions.  Otherwise no problems this pregnancy with reassuring antepartum testing and fetal surveillance other than macrosomia on 38 week growth scan.    Pregravid weight 122.5 kg Total Weight Gain 13.2 kg  pregnancy 3 Problems (from 02/03/18 to present)    Problem Noted Resolved   Chronic hypertension affecting pregnancy 07/04/2018 by Will Bonnet, MD No   Overview Addendum 07/04/2018  9:49 AM by Will Bonnet, MD    [x]  Aspirin 81 mg daily after 12 weeks; discontinue after 36 weeks [x]  baseline labs with CBC, CMP, urine protein/creatinine ratio [ ]  no BP meds unless BPs become elevated [X]  ultrasound for growth at 28, 32, 36 weeks 38 weeks 4149g 9lbs 2oz >90%ile AFI 10.2cm 34 weeks 2424g 5lbs 6oz c/w 78.8%ile AFI 11.5cm [redacted]w[redacted]d 1659g 3lbs 11oz c/w 77.2%ile AFI subjectively normal [x]  Aspirin 81 mg daily after 12 weeks; discontinue after 36 weeks [ ]  Baseline EKG   Current antihypertensives:  amlodipine   Baseline and surveillance labs (pulled in from [facility], refresh links as needed)  Lab Results  Component Value Date   PLT 304 04/16/2018   CREATININE 0.76 04/16/2018   AST 11 04/16/2018   ALT 11 04/16/2018   PROTCRRATIO 0.09 05/20/2017   PROTEIN24HR 257 (H) 11/24/2016    Antenatal Testing CHTN - O10.919  Group I  BP < 140/90, no preeclampsia, AGA,  nml AFV, +/- meds    Group II BP > 140/90, on meds, no preeclampsia, AGA, nml AFV  20-28-34-38  20-24-28-32-35-38  32//2 x wk  28//BPP wkly then 32//2 x wk  40 no meds; 39 meds  PRN or 37  Pre-eclampsia  GHTN - O13.9/Preeclampsia without  severe features  - O14.00   Preeclampsia with severe features - O14.10  Q 3-4wks  Q 2 wks  28//BPP wkly then 32//2 x wk  Inpatient  37  PRN or 34         Obesity affecting pregnancy, antepartum 04/04/2018 by Rod Can, CNM No   Overview Signed 07/04/2018  9:50 AM by Will Bonnet, MD    BMI >=40 [x]  early 1h gtt -  [x]  u/s for dating [x]   [ ]  folic acid 1mg  [x]  bASA (>12 weeks) [X]  Growth u/s 28, 32, and 36 weeks 38 weeks 4149g 9lbs 2oz >90%ile AFI 10.2cm 34 weeks 2424g 5lbs 6oz c/w 78.8%ile AFI 11.5cm [redacted]w[redacted]d 1659g 3lbs 11oz c/w 77.2%ile AFI subjectively normal [X]  NST/AFI weekly 36+ weeks  [X]  Anesthesia referral p 28 weeks       Family history of congenital heart disease  by Donette Larry No   Overview Signed 11/18/2016  1:11 PM by Dalia Heading, CNM    Seen by genetic counselor. Fetal echo scheduled      Supervision of high risk pregnancy, antepartum 10/12/2016 by Rod Can, CNM No   Overview Addendum 09/20/2018  4:20 PM by Homero Fellers, MD    Clinic [facility] Prenatal Labs  Dating 7wk u/s Blood type: B/Positive/-- (07/25 2725) B POS  Genetic Screen NIPT Diploid XY Antibody:Negative (07/25 0952)neg  Anatomic [place] Incomplete 4/2 Rubella: 1.50 (07/25 3664) Immune Varicella:  immune  GTT Early: normal Third trimester: 107 RPR: Non Reactive (07/25 0952) negative  Rhogam n/a HBsAg: Negative (07/25 0952)   TDaP vaccine  09/20/2018 Flu Shot: 03/07/18 HIV: Negative  Baby Food  Breast                              GBS: Negative 11/01/2018  Contraception Considering vasec Pap:10/12/16 NIL  CBB   Pelvis tested to 8lbs 14oz  CS/VBAC NA   Support Person Husband Aurther Lofterry       High risk due to E[facility]TN, hx of preeclampsia, morbid obesity, family hx of congenital heart disease, borderline hypothyroidism           Review of Systems: 10 point review of systems negative unless otherwise noted in HPI  Past Medical History: Past Medical History:  Diagnosis Date   . Anemia 12/2014  . GERD (gastroesophageal reflux disease)   . Hypertension   . Hypothyroidism   . Obesity affecting pregnancy     Past Surgical History: Past Surgical History:  Procedure Laterality Date  . FRACTURE SURGERY Right 33 years old    Past Obstetric History: # 1 - Date: 02/10/15, Sex: Female, Weight: 3055 g, GA: 2555w2d, Delivery: Vaginal, Spontaneous, Apgar1: 8, Apgar5: 9, Living: Living, Birth Comments: None  # 2 - Date: 05/21/17, Sex: Female, Weight: 4020 g, GA: 6876w1d, Delivery: Vaginal, Spontaneous, Apgar1: 7, Apgar5: 9, Living: Living, Birth Comments: None  # 3 - Date: None, Sex: None, Weight: None, GA: None, Delivery: None, Apgar1: None, Apgar5: None, Living: None, Birth Comments: None   Past Gynecologic History:  Family History: Family History  Problem Relation Age of Onset  . Depression Mother   . Heart disease Father   . Asthma Brother   . Emphysema Maternal Grandmother   . Diabetes Maternal Grandfather   . Cirrhosis Paternal Grandmother   . Heart disease Paternal Grandfather     Social History: Social History   Socioeconomic History  . Marital status: Married    Spouse name: Aurther Lofterry  . Number of children: 2  . Years of education: Not on file  . Highest education level: Not on file  Occupational History  . Not on file  Social Needs  . Financial resource strain: Not on file  . Food insecurity    Worry: Not on file    Inability: Not on file  . Transportation needs    Medical: Not on file    Non-medical: Not on file  Tobacco Use  . Smoking status: Former Smoker    Quit date: 07/31/2014    Years since quitting: 4.3  . Smokeless tobacco: Never Used  Substance and Sexual Activity  . Alcohol use: No  . Drug use: No  . Sexual activity: Yes    Birth control/protection: None  Lifestyle  . Physical activity    Days per week: Not on file    Minutes per session: Not on file  . Stress: Not on file  Relationships  . Social Musicianconnections    Talks on  phone: Not on file    Gets together: Not on file    Attends religious service: Not on file    Active member of club or organization: Not on file    Attends meetings of clubs or organizations: Not on file    Relationship status: Not on file  . Intimate partner violence    Fear of current or ex partner: Not on file  Emotionally abused: Not on file    Physically abused: Not on file    Forced sexual activity: Not on file  Other Topics Concern  . Not on file  Social History Narrative  . Not on file    Medications: Prior to Admission medications   Medication Sig Start Date End Date Taking? Authorizing Provider  Acetaminophen (MAPAP) 500 MG coapsule Take by mouth.   Yes [provider]  amLODipine (NORVASC) 5 MG tablet Take 1 tablet (5 mg total) by mouth 2 (two) times a day. 09/13/18  Yes Oswaldo ConroySchmid, Jacelyn Y, CNM  famotidine (PEPCID) 20 MG tablet Take by mouth. 07/30/18 07/30/19 Yes [provider]  folic acid (FOLVITE) 1 MG tablet Take 1 tablet (1 mg total) by mouth daily. 04/04/18  Yes Tresea MallGledhill, Jane, CNM  Prenatal Vit-Fe Fumarate-FA (PRENATAL MULTIVITAMIN) TABS tablet Take 1 tablet by mouth daily at 12 noon.   Yes [provider]  prochlorperazine (COMPAZINE) 10 MG tablet TAKE 1 TABLET (10 MG TOTAL) BY MOUTH EVERY 6 (SIX) HOURS AS NEEDED (HEADACHE). 10/28/18  Yes Oswaldo ConroySchmid, Jacelyn Y, CNM  Butalbital-APAP-Caffeine [phone] MG capsule Take 1-2 capsules by mouth every 6 (six) hours as needed for headache. Patient not taking: Reported on 11/20/2018 03/09/17   Tresea MallGledhill, Jane, CNM    Allergies: Allergies  Allergen Reactions  . Salmon [Fish Allergy] Anaphylaxis    Tongue, lips and finger tips get tingly  . Hydrochlorothiazide Other (See Comments)    Elevated creatinine  . Latex Itching and Rash    Physical Exam: Vitals: Blood pressure 120/73, pulse 91, temperature 98.8 F (37.1 C), temperature source Oral, resp. rate 18, height 5' 4.5" (1.638 m), weight 135.6 kg, last  menstrual period 02/03/2018, currently breastfeeding.  FHT: 130, moderate, +accels, no decels Toco: irregular General: NAD HEENT: normocephalic, anicteric Pulmonary: No increased work of breathing Cardiovascular: RRR, distal pulses 2+ Abdomen: Gravid, non-tender Leopolds:vtx Genitourinary: closed on most recent clinic check Extremities: no edema, erythema, or tenderness Neurologic: Grossly intact Psychiatric: mood appropriate, affect full  Labs: Results for orders placed or performed during the hospital encounter of 11/20/18 (from the past 24 hour(s))  CBC     Status: Abnormal   Collection Time: 11/20/18  1:18 AM  Result Value Ref Range   WBC 13.3 (H) 4.0 - 10.5 K/uL   RBC 3.91 3.87 - 5.11 MIL/uL   Hemoglobin 10.6 (L) 12.0 - 15.0 g/dL   HCT 14.732.6 (L) 82.936.0 - 56.246.0 %   MCV 83.4 80.0 - 100.0 fL   MCH 27.1 26.0 - 34.0 pg   MCHC 32.5 30.0 - 36.0 g/dL   RDW 13.015.5 86.511.5 - 78.415.5 %   Platelets 227 150 - 400 K/uL   nRBC 0.0 0.0 - 0.2 %  Type and screen     Status: None (Preliminary result)   Collection Time: 11/20/18  1:18 AM  Result Value Ref Range   ABO/RH(D) PENDING    Antibody Screen PENDING    Sample Expiration      11/23/2018,2359 Performed at [facility], [address] [place], [place] [address]   Type and screen     Status: None (Preliminary result)   Collection Time: 11/20/18  1:49 AM  Result Value Ref Range   ABO/RH(D) PENDING    Antibody Screen PENDING    Sample Expiration      11/23/2018,2359 Performed at [facility], [address] [place], [place] [address]     Assessment: 33 y.o. L2G4010G3P2002 7443w0d by 11/27/2018, by Ultrasound presenting  for IOL [facility]  Plan: 1) Labor - admit for elective IOL  2) Fetus - cat I tracing  3) PNL - Blood type --/--/PENDING (08/19 0149) / Anti-bodyscreen PENDING (08/19 0149) / Rubella 1.30 (01/14 1022) / Varicella Immune / RPR Non Reactive (06/11 1525) / HBsAg Negative (01/14 1022) / HIV Non Reactive  (06/11 1525) / 1-hr OGTT 107 / GBS Negative (07/31 1606)  4) Immunization History -  Immunization History  Administered Date(s) Administered  . Influenza,inj,Quad PF,6+ Mos 01/11/2017  . Tdap 05/10/2012, 12/29/2014, 03/23/2017, 09/20/2018    5) CHTN - normotensive on admission  6)  Disposition - pending delivery  Vena AustriaAndreas Francess Mullen, MD, Merlinda FrederickFACOG [facility], [facility] 11/20/2018, 3:43 AM

## 2018-11-20 NOTE — Progress Notes (Signed)
  Labor Progress Note   33 y.o. [id number] @ [redacted]w[redacted]d , admitted for  Pregnancy, Labor Management.   Subjective:  IOL for 39 weeks w HTN and Macrosomia Also has obesity RF Been pushing and feeling exhausted and pain coming back after intial epidural  Objective:  BP 128/86 (BP Location: Right Arm)   Pulse 79   Temp 98.5 F (36.9 C) (Oral)   Resp 16   Ht 5' 4.5" (1.638 m)   Wt 135.6 kg   LMP 02/03/2018   SpO2 93%   BMI 50.53 kg/m  Abd: gravid, ND, FHT present, moderate tenderness on exam Extr: trace to 1+ bilateral pedal edema SVE: C/C/-3, caput, min descent w pushing effort  EFM: FHR: 140 bpm, variability: moderate,  accelerations:  Present,  decelerations:  Present Early decels w pushing Toco: Frequency: Every 3-4 minutes Labs: I have reviewed the patient's lab results.   Assessment & Plan:  [id number] @ [redacted]w[redacted]d, admitted for  Pregnancy and Labor/Delivery Management  1. Pain management: epidural. 2. FWB: FHT category 1.  3. ID: GBS negative 4. Labor management: Arrest of descent discussed.  VAVD vs CS, concern for size of baby and high station lends me to recommend CS preferrably; after risks and benefits of each discussed she agrees also to have CS performed  The risks of cesarean section discussed with the patient included but were not limited to: bleeding which may require transfusion or reoperation; infection which may require antibiotics; injury to bowel, bladder, ureters or other surrounding organs; injury to the fetus; need for additional procedures including hysterectomy in the event of a life-threatening hemorrhage; placental abnormalities wth subsequent pregnancies, incisional problems, thromboembolic phenomenon and other postoperative/anesthesia complications. The patient concurred with the proposed plan, giving informed written consent for the procedure.    All discussed with patient, see orders  Barnett Applebaum, MD, Loura Pardon Ob[facility], [place]  [facility] 11/20/2018  10:21 PM

## 2018-11-21 ENCOUNTER — Telehealth: Payer: Self-pay | Admitting: Obstetrics & Gynecology

## 2018-11-21 DIAGNOSIS — O1092 Unspecified pre-existing hypertension complicating childbirth: Secondary | ICD-10-CM

## 2018-11-21 DIAGNOSIS — O99284 Endocrine, nutritional and metabolic diseases complicating childbirth: Secondary | ICD-10-CM

## 2018-11-21 DIAGNOSIS — O99214 Obesity complicating childbirth: Secondary | ICD-10-CM

## 2018-11-21 LAB — CBC
HCT: 26.9 % — ABNORMAL LOW (ref 36.0–46.0)
Hemoglobin: 8.8 g/dL — ABNORMAL LOW (ref 12.0–15.0)
MCH: 27.5 pg (ref 26.0–34.0)
MCHC: 32.7 g/dL (ref 30.0–36.0)
MCV: 84.1 fL (ref 80.0–100.0)
Platelets: 222 10*3/uL (ref 150–400)
RBC: 3.2 MIL/uL — ABNORMAL LOW (ref 3.87–5.11)
RDW: 15.5 % (ref 11.5–15.5)
WBC: 19.7 10*3/uL — ABNORMAL HIGH (ref 4.0–10.5)
nRBC: 0 % (ref 0.0–0.2)

## 2018-11-21 LAB — CBC WITH DIFFERENTIAL/PLATELET
Abs Immature Granulocytes: 0.09 10*3/uL — ABNORMAL HIGH (ref 0.00–0.07)
Basophils Absolute: 0 10*3/uL (ref 0.0–0.1)
Basophils Relative: 0 %
Eosinophils Absolute: 0.1 10*3/uL (ref 0.0–0.5)
Eosinophils Relative: 0 %
HCT: 24.7 % — ABNORMAL LOW (ref 36.0–46.0)
Hemoglobin: 8.1 g/dL — ABNORMAL LOW (ref 12.0–15.0)
Immature Granulocytes: 1 %
Lymphocytes Relative: 20 %
Lymphs Abs: 2.9 10*3/uL (ref 0.7–4.0)
MCH: 27.7 pg (ref 26.0–34.0)
MCHC: 32.8 g/dL (ref 30.0–36.0)
MCV: 84.6 fL (ref 80.0–100.0)
Monocytes Absolute: 1.2 10*3/uL — ABNORMAL HIGH (ref 0.1–1.0)
Monocytes Relative: 9 %
Neutro Abs: 9.8 10*3/uL — ABNORMAL HIGH (ref 1.7–7.7)
Neutrophils Relative %: 70 %
Platelets: 206 10*3/uL (ref 150–400)
RBC: 2.92 MIL/uL — ABNORMAL LOW (ref 3.87–5.11)
RDW: 15.7 % — ABNORMAL HIGH (ref 11.5–15.5)
WBC: 14.1 10*3/uL — ABNORMAL HIGH (ref 4.0–10.5)
nRBC: 0 % (ref 0.0–0.2)

## 2018-11-21 LAB — RPR: RPR Ser Ql: NONREACTIVE

## 2018-11-21 MED ORDER — DIPHENHYDRAMINE HCL 50 MG/ML IJ SOLN
12.5000 mg | Freq: Once | INTRAMUSCULAR | Status: AC
  Administered 2018-11-21: 12.5 mg via INTRAVENOUS

## 2018-11-21 MED ORDER — NALBUPHINE HCL 10 MG/ML IJ SOLN: 5.0000 mg | Freq: Once | INTRAMUSCULAR | Status: DC | PRN

## 2018-11-21 MED ORDER — DIPHENHYDRAMINE HCL 25 MG PO CAPS: 25.0000 mg | ORAL_CAPSULE | Freq: Four times a day (QID) | ORAL | Status: DC | PRN

## 2018-11-21 MED ORDER — IBUPROFEN 800 MG PO TABS: 800.0000 mg | ORAL_TABLET | Freq: Three times a day (TID) | ORAL | Status: DC

## 2018-11-21 MED ORDER — SODIUM CHLORIDE 0.9 % IV SOLN
2.5000 [IU]/h | INTRAVENOUS | Status: DC
  Filled 2018-11-21: qty 4

## 2018-11-21 MED ORDER — DIBUCAINE (PERIANAL) 1 % EX OINT: 1.0000 "application " | TOPICAL_OINTMENT | CUTANEOUS | Status: DC | PRN

## 2018-11-21 MED ORDER — IBUPROFEN 800 MG PO TABS
800.0000 mg | ORAL_TABLET | Freq: Three times a day (TID) | ORAL | Status: DC
  Administered 2018-11-21 – 2018-11-23 (×7): 800 mg via ORAL
  Filled 2018-11-21 (×8): qty 1

## 2018-11-21 MED ORDER — OXYCODONE-ACETAMINOPHEN 5-325 MG PO TABS
1.0000 | ORAL_TABLET | ORAL | Status: DC | PRN
  Administered 2018-11-23: 2 via ORAL
  Filled 2018-11-21: qty 2

## 2018-11-21 MED ORDER — MEPERIDINE HCL 25 MG/ML IJ SOLN: 6.2500 mg | INTRAMUSCULAR | Status: DC | PRN

## 2018-11-21 MED ORDER — FERROUS SULFATE 325 (65 FE) MG PO TABS
325.0000 mg | ORAL_TABLET | Freq: Two times a day (BID) | ORAL | Status: DC
  Administered 2018-11-21 – 2018-11-23 (×5): 325 mg via ORAL
  Filled 2018-11-21 (×5): qty 1

## 2018-11-21 MED ORDER — KETOROLAC TROMETHAMINE 30 MG/ML IJ SOLN: 15.0000 mg | Freq: Four times a day (QID) | INTRAMUSCULAR | Status: DC

## 2018-11-21 MED ORDER — MENTHOL 3 MG MT LOZG
1.0000 | LOZENGE | OROMUCOSAL | Status: DC | PRN
  Filled 2018-11-21: qty 9

## 2018-11-21 MED ORDER — SODIUM CHLORIDE 0.9% FLUSH: 3.0000 mL | INTRAVENOUS | Status: DC | PRN

## 2018-11-21 MED ORDER — DIPHENHYDRAMINE HCL 50 MG/ML IJ SOLN: 12.5000 mg | INTRAMUSCULAR | Status: DC | PRN

## 2018-11-21 MED ORDER — ACETAMINOPHEN 500 MG PO TABS: 1000.0000 mg | ORAL_TABLET | Freq: Four times a day (QID) | ORAL | Status: DC

## 2018-11-21 MED ORDER — NALOXONE HCL 4 MG/10ML IJ SOLN: 1.0000 ug/kg/h | INTRAVENOUS | Status: DC | PRN

## 2018-11-21 MED ORDER — SIMETHICONE 80 MG PO CHEW
80.0000 mg | CHEWABLE_TABLET | ORAL | Status: DC | PRN
  Administered 2018-11-22 – 2018-11-23 (×6): 80 mg via ORAL
  Filled 2018-11-21 (×6): qty 1

## 2018-11-21 MED ORDER — DIPHENHYDRAMINE HCL 50 MG/ML IJ SOLN
INTRAMUSCULAR | Status: AC
  Filled 2018-11-21: qty 1

## 2018-11-21 MED ORDER — NALBUPHINE HCL 10 MG/ML IJ SOLN: 5.0000 mg | INTRAMUSCULAR | Status: DC | PRN

## 2018-11-21 MED ORDER — NALOXONE HCL 0.4 MG/ML IJ SOLN: 0.4000 mg | INTRAMUSCULAR | Status: DC | PRN

## 2018-11-21 MED ORDER — SCOPOLAMINE 1 MG/3DAYS TD PT72: 1.0000 | MEDICATED_PATCH | Freq: Once | TRANSDERMAL | Status: DC

## 2018-11-21 MED ORDER — WITCH HAZEL-GLYCERIN EX PADS: 1.0000 "application " | MEDICATED_PAD | CUTANEOUS | Status: DC | PRN

## 2018-11-21 MED ORDER — SENNOSIDES-DOCUSATE SODIUM 8.6-50 MG PO TABS
2.0000 | ORAL_TABLET | ORAL | Status: DC
  Administered 2018-11-21 – 2018-11-23 (×3): 2 via ORAL
  Filled 2018-11-21 (×3): qty 2

## 2018-11-21 MED ORDER — CHLOROPROCAINE HCL (PF) 3 % IJ SOLN
INTRAMUSCULAR | Status: DC | PRN
  Administered 2018-11-21 (×2): 3 mL via EPIDURAL
  Administered 2018-11-21 (×2): 2 mL via EPIDURAL

## 2018-11-21 MED ORDER — LACTATED RINGERS IV SOLN: INTRAVENOUS | Status: DC

## 2018-11-21 MED ORDER — OXYCODONE HCL 5 MG PO TABS: 5.0000 mg | ORAL_TABLET | ORAL | Status: AC | PRN

## 2018-11-21 MED ORDER — COCONUT OIL OIL
1.0000 "application " | TOPICAL_OIL | Status: DC | PRN
  Administered 2018-11-21: 1 via TOPICAL
  Filled 2018-11-21: qty 120

## 2018-11-21 MED ORDER — DIPHENHYDRAMINE HCL 25 MG PO CAPS: 25.0000 mg | ORAL_CAPSULE | ORAL | Status: DC | PRN

## 2018-11-21 MED ORDER — ONDANSETRON HCL 4 MG/2ML IJ SOLN: 4.0000 mg | Freq: Three times a day (TID) | INTRAMUSCULAR | Status: DC | PRN

## 2018-11-21 MED ORDER — ACETAMINOPHEN 500 MG PO TABS
1000.0000 mg | ORAL_TABLET | Freq: Four times a day (QID) | ORAL | Status: DC
  Administered 2018-11-21 – 2018-11-23 (×9): 1000 mg via ORAL
  Filled 2018-11-21 (×10): qty 2

## 2018-11-21 NOTE — Anesthesia Procedure Notes (Signed)
Spinal  Patient location during procedure: OR Start time: 11/21/2018 10:55 PM End time: 11/21/2018 11:03 PM Staffing Anesthesiologist: Durenda Hurt, MD Performed: anesthesiologist  Preanesthetic Checklist Completed: patient identified, site marked, surgical consent, pre-op evaluation, timeout performed, IV checked, risks and benefits discussed and monitors and equipment checked Spinal Block Patient position: sitting Prep: ChloraPrep Patient monitoring: heart rate, cardiac monitor, continuous pulse ox and blood pressure Approach: midline Location: L3-4 Injection technique: single-shot Needle Needle type: Pencan  Needle gauge: 25 G Needle length: 9 cm Needle insertion depth: 8 cm Catheter at skin depth: 12 cm Assessment Sensory level: T3 Additional Notes Pt tolerated CSE placement well without any apparent complications.

## 2018-11-21 NOTE — Anesthesia Post-op Follow-up Note (Signed)
Anesthesia QCDR form completed.        

## 2018-11-21 NOTE — Transfer of Care (Signed)
Immediate Anesthesia Transfer of Care Note  Patient: Debra Bender  Procedure(s) Performed: CESAREAN SECTION (N/A Abdomen)  Patient Location: PACU  Anesthesia Type:Spinal  Level of Consciousness: awake, alert , oriented and patient cooperative  Airway & Oxygen Therapy: Patient Spontanous Breathing  Post-op Assessment: Report given to RN and Post -op Vital signs reviewed and stable  Post vital signs: Reviewed and stable  Last Vitals:  Vitals Value Taken Time  BP    Temp    Pulse    Resp    SpO2      Last Pain:  Vitals:   11/20/18 2007  TempSrc: Oral  PainSc: 3       Patients Stated Pain Goal: 0 (53/97/67 3419)  Complications: No apparent anesthesia complications

## 2018-11-21 NOTE — Telephone Encounter (Signed)
Attempt to reach patient to schedule appointment. Unable to leave voice mail due to voicemail full.

## 2018-11-21 NOTE — Lactation Note (Signed)
This note was copied from a baby's chart. Lactation Consultation Note  Patient Name: Debra Bender ZYSAY'T Date: 11/21/2018 Reason for consult: Follow-up assessment   Maternal Data    Feeding Feeding Type: Breast Fed  LATCH Score Latch: Grasps breast easily, tongue down, lips flanged, rhythmical sucking.  Audible Swallowing: Spontaneous and intermittent  Type of Nipple: Everted at rest and after stimulation  Comfort (Breast/Nipple): Soft / non-tender  Hold (Positioning): No assistance needed to correctly position infant at breast.  LATCH Score: 10  Interventions Interventions: Breast feeding basics reviewed;DEBP  Lactation Tools Discussed/Used Tools: Pump Breast pump type: Double-Electric Breast Pump Pump Review: Setup, frequency, and cleaning;Other (comment)(feeding with a spoon or cup preferred over bottle) Initiated by:: Eliane Decree [facility] Intern Date initiated:: 11/21/18  Mom has breastfed her other children while pumping 1-2 bottles a day.  She intends to follow the same plan with this baby and was given a pump and parts to do so.  She reports that she had to use SNS and formula supplementation for her middle child and was used to pumping.  Mom was educated on the risks of bottle introduction during the first 4-6 weeks postpartum.  She was encouraged to offer pumped milk in a spoon or cup instead of bottle feeding after she pumps later today.  [facility] intern reminded mom how her milk will transition and that this will likely occur sooner since this is her third child.  Baby self-latched at the end of the consult with a naturally deep latch.  Mom appears happy and confident in her son's breastfeeding ability.  Consult Status Consult Status: Follow-up Date: 11/21/18 Follow-up type: [place] 11/21/2018, 11:34 AM

## 2018-11-21 NOTE — Discharge Instructions (Signed)
Discharge Instructions:   Follow-up Appointment:   If there are any new medications, they have been ordered and will be available for pickup at the listed pharmacy on your way home from the hospital.   Call office if you have any of the following: headache, visual changes, fever >101.0 F, chills, shortness of breath, breast concerns, excessive vaginal bleeding, incision drainage or problems, leg pain or redness, depression or any other concerns. If you have vaginal discharge with an odor, let your doctor know.   It is normal to bleed for up to 6 weeks. You should not soak through more than 1 pad in 1 hour. If you have a blood clot larger than your fist with continued bleeding, call your doctor.   After a c-section, you should expect a small amount of blood or clear fluid coming from the incision and abdominal cramping/soreness. Inspect your incision site daily. Stand in front of a mirror to look for any redness, incision opening, or discolored/odorness drainage. Take a shower daily and continue good hygiene. Use own towel and washcloth (do not share). Make sure your sheets on your bed are clean. No pets sleeping around your incision site. Dressing will be removed at your postpartum visit. If the dressing does become wet or soiled underneath, it is okay to remove it.   On-Q pump: You will remove on day 5 after insertion or if the ball becomes flat before day 5. You will remove on: November 25, 2018  Activity: Do not lift > 10 lbs for 6 weeks (do not lift anything heavier than your baby). No intercourse, tampons, swimming pools, hot tubs, baths (only showers) for 6 weeks.  No driving for 1-2 weeks. Continue prenatal vitamin, especially if breastfeeding. Increase calories and fluids (water) while breastfeeding.   Your milk will come in, in the next couple of days (right now it is colostrum). You may have a slight fever when your milk comes in, but it should go away on its own.  If it does not, and  rises above 101 F please call the doctor. You will also feel achy and your breasts will be firm. They will also start to leak. If you are breastfeeding, continue as you have been and you can pump/express milk for comfort.   If you have too much milk, your breasts can become engorged, which could lead to mastitis. This is an infection of the milk ducts. It can be very painful and you will need to notify your doctor to obtain a prescription for antibiotics. You can also treat it with a shower or hot/cold compress.   For concerns about your baby, please call your pediatrician.  For breastfeeding concerns, the lactation consultant can be reached at [phone].   Postpartum blues (feelings of happy one minute and sad another minute) are normal for the first few weeks but if it gets worse let your doctor know.   Congratulations! We enjoyed caring for you and your new bundle of joy!    Cesarean Delivery, Care After This sheet gives you information about how to care for yourself after your procedure. Your health care provider may also give you more specific instructions. If you have problems or questions, contact your health care provider. What can I expect after the procedure? After the procedure, it is common to have:  A small amount of blood or clear fluid coming from the incision.  Some redness, swelling, and pain in your incision area.  Some abdominal pain and soreness.  Vaginal  bleeding (lochia). Even though you did not have a vaginal delivery, you will still have vaginal bleeding and discharge.  Pelvic cramps.  Fatigue. You may have pain, swelling, and discomfort in the tissue between your vagina and your anus (perineum) if:  Your C-section was unplanned, and you were allowed to labor and push.  An incision was made in the area (episiotomy) or the tissue tore during attempted vaginal delivery. Follow these instructions at home: Incision care   Follow instructions from your  health care provider about how to take care of your incision. Make sure you: ? Wash your hands with soap and water before you change your bandage (dressing). If soap and water are not available, use hand sanitizer. ? If you have a dressing, change it or remove it as told by your health care provider. ? Leave stitches (sutures), skin staples, skin glue, or adhesive strips in place. These skin closures may need to stay in place for 2 weeks or longer. If adhesive strip edges start to loosen and curl up, you may trim the loose edges. Do not remove adhesive strips completely unless your health care provider tells you to do that.  Check your incision area every day for signs of infection. Check for: ? More redness, swelling, or pain. ? More fluid or blood. ? Warmth. ? Pus or a bad smell.  Do not take baths, swim, or use a hot tub until your health care provider says it's okay. Ask your health care provider if you can take showers.  When you cough or sneeze, hug a pillow. This helps with pain and decreases the chance of your incision opening up (dehiscing). Do this until your incision heals. Medicines  Take over-the-counter and prescription medicines only as told by your health care provider.  If you were prescribed an antibiotic medicine, take it as told by your health care provider. Do not stop taking the antibiotic even if you start to feel better.  Do not drive or use heavy machinery while taking prescription pain medicine. Lifestyle  Do not drink alcohol. This is especially important if you are breastfeeding or taking pain medicine.  Do not use any products that contain nicotine or tobacco, such as cigarettes, e-cigarettes, and chewing tobacco. If you need help quitting, ask your health care provider. Eating and drinking  Drink at least 8 eight-ounce glasses of water every day unless told not to by your health care provider. If you breastfeed, you may need to drink even more water.  Eat  high-fiber foods every day. These foods may help prevent or relieve constipation. High-fiber foods include: ? Whole grain cereals and breads. ? Brown rice. ? Beans. ? Fresh fruits and vegetables. Activity   If possible, have someone help you care for your baby and help with household activities for at least a few days after you leave the hospital.  Return to your normal activities as told by your health care provider. Ask your health care provider what activities are safe for you.  Rest as much as possible. Try to rest or take a nap while your baby is sleeping.  Do not lift anything that is heavier than 10 lbs (4.5 kg), or the limit that you were told, until your health care provider says that it is safe.  Talk with your health care provider about when you can engage in sexual activity. This may depend on your: ? Risk of infection. ? How fast you heal. ? Comfort and desire to engage in  sexual activity. General instructions  Do not use tampons or douches until your health care provider approves.  Wear loose, comfortable clothing and a supportive and well-fitting bra.  Keep your perineum clean and dry. Wipe from front to back when you use the toilet.  If you pass a blood clot, save it and call your health care provider to discuss. Do not flush blood clots down the toilet before you get instructions from your health care provider.  Keep all follow-up visits for you and your baby as told by your health care provider. This is important. Contact a health care provider if:  You have: ? A fever. ? Bad-smelling vaginal discharge. ? Pus or a bad smell coming from your incision. ? Difficulty or pain when urinating. ? A sudden increase or decrease in the frequency of your bowel movements. ? More redness, swelling, or pain around your incision. ? More fluid or blood coming from your incision. ? A rash. ? Nausea. ? Little or no interest in activities you used to enjoy. ? Questions about  caring for yourself or your baby.  Your incision feels warm to the touch.  Your breasts turn red or become painful or hard.  You feel unusually sad or worried.  You vomit.  You pass a blood clot from your vagina.  You urinate more than usual.  You are dizzy or light-headed. Get help right away if:  You have: ? Pain that does not go away or get better with medicine. ? Chest pain. ? Difficulty breathing. ? Blurred vision or spots in your vision. ? Thoughts about hurting yourself or your baby. ? New pain in your abdomen or in one of your legs. ? A severe headache.  You faint.  You bleed from your vagina so much that you fill more than one sanitary pad in one hour. Bleeding should not be heavier than your heaviest period. Summary  After the procedure, it is common to have pain at your incision site, abdominal cramping, and slight bleeding from your vagina.  Check your incision area every day for signs of infection.  Tell your health care provider about any unusual symptoms.  Keep all follow-up visits for you and your baby as told by your health care provider. This information is not intended to replace advice given to you by your health care provider. Make sure you discuss any questions you have with your health care provider. Document Released: 12/10/2001 Document Revised: 09/26/2017 Document Reviewed: 09/26/2017 Elsevier Patient Education  2020 [employer]

## 2018-11-21 NOTE — Anesthesia Post-op Follow-up Note (Signed)
  Anesthesia Pain Follow-up Note  Patient: Debra Bender  Day #: 1  Date of Follow-up: 11/21/2018 Time: 8:15 AM  Last Vitals:  Vitals:   11/21/18 0754 11/21/18 0800  BP: 125/78   Pulse: 92 (!) 101  Resp: 18   Temp: 37.1 C   SpO2: 96% 97%    Level of Consciousness: alert  Pain: mild   Side Effects:None  Catheter Site Exam:clean     Plan: D/C from anesthesia care at surgeon's request  Brantley Fling

## 2018-11-21 NOTE — Telephone Encounter (Signed)
-----   Message from Gae Dry, MD sent at 11/21/2018 12:50 AM EDT ----- Regarding: appt Sch post op appt on Monday Aug 31

## 2018-11-21 NOTE — Op Note (Signed)
Cesarean Section Procedure Note Indications: cephalo-pelvic disproportion, failure to progress: arrest of descent and term intrauterine pregnancy  Pre-operative Diagnosis: Intrauterine pregnancy [redacted]w[redacted]d ;  cephalo-pelvic disproportion, failure to progress: arrest of descent and term intrauterine pregnancy Post-operative Diagnosis: same, delivered. Procedure: Low Transverse Cesarean Section Surgeon: Barnett Applebaum, MD, FACOG Assistant(s): Leticia Penna Anesthesia: Spinal anesthesia Estimated Blood YTKP:5465 mL Complications: None; patient tolerated the procedure well. Disposition: PACU - hemodynamically stable. Condition: stable  Findings: A female infant in the cephalic presentation (OP). Amniotic fluid - Clear  Birth weight 9-6 lbs.  Apgars of 8 and 9.  Intact placenta with a three-vessel cord. Grossly normal uterus, tubes and ovaries bilaterally. No intraabdominal adhesions were noted.  Significant cervical bleeding due to her being fully dilated prior to CS noted with additional sutures placed to obtain hemostasis.  Procedure Details   The patient was taken to Operating Room, identified as the correct patient and the procedure verified as C-Section Delivery. A Time Out was held and the above information confirmed. After induction of anesthesia, the patient was draped and prepped in the usual sterile manner. A Pfannenstiel incision was made and carried down through the subcutaneous tissue to the fascia. Fascial incision was made and extended transversely with the Mayo scissors. The fascia was separated from the underlying rectus tissue superiorly and inferiorly. The peritoneum was identified and entered bluntly. Peritoneal incision was extended longitudinally. The utero-vesical peritoneal reflection was incised transversely and a bladder flap was created digitally.  A low transverse hysterotomy was made. The fetus was delivered atraumatically. The umbilical cord was clamped x2 and cut and the  infant was handed to the awaiting pediatricians. The placenta was removed intact and appeared normal with a 3-vessel cord.  The uterus was exteriorized and cleared of all clot and debris. The hysterotomy was closed with running sutures of 0 Vicryl suture. A second imbricating layer was placed with the same suture.  Additional sutures were placed in the cervix/lower uterine segment to obtain full hemostasis. Excellent hemostasis was observed. The uterus was returned to the abdomen. The pelvis was irrigated and again, excellent hemostasis was noted.  The On Q Pain pump System was then placed.  Trocars were placed through the abdominal wall into the subfascial space and these were used to thread the silver soaker cathaters into place.The rectus fascia was then reapproximated with running sutures of Maxon, with careful placement not to incorporate the cathaters. Subcutaneous tissues are then irrigated with saline and hemostasis assured.  Skin is then closed with 4-0 vicryl suture in a subcuticular fashion followed by skin adhesive. The cathaters are flushed each with 5 mL of Bupivicaine and stabilized into place with dressing. Instrument, sponge, and needle counts were correct prior to the abdominal closure and at the conclusion of the case.  The patient tolerated the procedure well and was transferred to the recovery room in stable condition.   Barnett Applebaum, MD, [facility], [facility] 11/21/2018  12:46 AM

## 2018-11-21 NOTE — Progress Notes (Signed)
Obstetric Postpartum/PostOperative Daily Progress Note Subjective:  33 y.o. G3P3003 post-operative 11 hours, day # 1 status post primary cesarean delivery.  She is not ambulating, is tolerating po, is not voiding spontaneously. Her foley catheter is intact.  Her pain is well controlled on PO pain medications. Her lochia is less than menses.    She reports breastfeeding is going well.   Medications SCHEDULED MEDICATIONS  . acetaminophen  1,000 mg Oral Q6H  . amLODipine  5 mg Oral Daily  . diphenhydrAMINE      . famotidine  20 mg Oral BID  . ferrous sulfate  325 mg Oral BID WC  . ibuprofen  800 mg Oral Q8H  . senna-docusate  2 tablet Oral Q24H    MEDICATION INFUSIONS  . lactated ringers    . oxytocin    . oxytocin 2.5 Units/hr (11/21/18 0057)    PRN MEDICATIONS  coconut oil, witch hazel-glycerin **AND** dibucaine, diphenhydrAMINE, menthol-cetylpyridinium, oxyCODONE, oxyCODONE-acetaminophen, simethicone    Objective:   Vitals:   11/21/18 0900 11/21/18 1000 11/21/18 1100 11/21/18 1114  BP:    112/75  Pulse: 87 87 96 85  Resp:      Temp:    98.3 F (36.8 C)  TempSrc:    Oral  SpO2: 94% 94% 95% 93%  Weight:      Height:        Current Vital Signs 24h Vital Sign Ranges  T 98.3 F (36.8 C) Temp  Avg: 98.5 F (36.9 C)  Min: 97.8 F (36.6 C)  Max: 99 F (37.2 C)  BP 112/75 BP  Min: 99/88  Max: 149/82  HR 85 Pulse  Avg: 90.2  Min: 76  Max: 116  RR 18 Resp  Avg: 16  Min: 9  Max: 23  SaO2 93 %(Room Air) (room air) SpO2  Avg: 93.6 %  Min: 86 %  Max: 97 %       24 Hour I/O Current Shift I/O  Time Ins Outs 08/19 0701 - 08/20 0700 In: 1232.2 [I.V.:1232.2] Out: 2188 [Urine:490] 08/20 0701 - 08/20 1900 In: 240 [P.O.:240] Out: 100 [Urine:100]  General: NAD Pulmonary: no increased work of breathing Abdomen: non-distended, non-tender, fundus firm at level of umbilicus Inc: Clean/dry/intact, On Q pump is intact Extremities: no edema, no erythema, no tenderness  Labs:  Recent  Labs  Lab 11/20/18 0118 11/21/18 0756  WBC 13.3* 19.7*  HGB 10.6* 8.8*  HCT 32.6* 26.9*  PLT 227 222     Assessment:   33 y.o. G3P3003 postoperative day # 1 status post primary cesarean section  Plan:  1) Acute blood loss anemia - hemodynamically stable and asymptomatic - po ferrous sulfate  2) B positive, Rubella Immune, Varicella Immune  3) TDAP status given antepartum  4) Breastfeeding  5) Disposition: continue routine s/p c/section care   Rod Can, CNM 11/21/2018 11:52 AM

## 2018-11-21 NOTE — Discharge Summary (Signed)
OB Discharge Summary     Patient Name: Debra Bender DOB: 15-Sep-1985 MRN: [id number]  Date of admission: 11/20/2018 Delivering MD: Letitia Libraobert Paul Harris, MD  Date of Delivery: 11/20/2018  Date of discharge: 11/23/2018  Admitting diagnosis: Induction  Intrauterine pregnancy: HTN, Obesity, Macrosomia, Arrest of Descent, at 8176w0d     Secondary diagnosis: Chronic Hypertension     Discharge diagnosis: Term Pregnancy Delivered and CHTN, Failed induction of labor and Reasons for cesarean section  Arrest of Descent                         Hospital course:  Induction of Labor With Cesarean Section  33 y.o. yo G3P3003 at 976w0d was admitted to the hospital 11/20/2018 for induction of labor. Patient had a labor course significant for dilation to 10 yet arrest of descent w pushing, infant found to be OP position and 9 lbs 6 oz.  The patient went for cesarean section due to Arrest of Descent, and delivered a Viable infant,11/20/2018  Membrane Rupture Time/Date: 8:18 PM ,11/20/2018   Details of operation can be found in separate operative Note.  Patient had an uncomplicated postpartum course. She is ambulating, tolerating a regular diet, passing flatus, and urinating well.  Patient is discharged home in stable condition on 11/23/18.                                                                                                   Post partum procedures:none  Complications: Hemorrhage>105100mL  Physical exam on 11/23/2018: Vitals:   11/22/18 1957 11/22/18 2328 11/23/18 0317 11/23/18 0750  BP: 115/81 129/77 133/88 134/75  Pulse: 93 78 78 80  Resp: 20 18 18 18   Temp: 98.4 F (36.9 C) 98.3 F (36.8 C) 98 F (36.7 C) 97.8 F (36.6 C)  TempSrc: Oral Oral Oral Oral  SpO2: 97% 100% 98% 97%  Weight:      Height:       General: alert, cooperative and no distress Lochia: appropriate Uterine Fundus: firm Incision: Healing well with no significant drainage DVT Evaluation: No evidence of DVT seen on physical  exam.  Labs: Lab Results  Component Value Date   WBC 12.3 (H) 11/23/2018   HGB 7.8 (L) 11/23/2018   HCT 25.1 (L) 11/23/2018   MCV 87.2 11/23/2018   PLT 237 11/23/2018   CMP Latest Ref Rng & Units 09/12/2018  Glucose 65 - 99 mg/dL 409(W114(H)  BUN 6 - 20 mg/dL 8  Creatinine 1.190.57 - 1.471.00 mg/dL 8.290.64  Sodium 562134 - 130144 mmol/L 134  Potassium 3.5 - 5.2 mmol/L 4.2  Chloride 96 - 106 mmol/L 100  CO2 20 - 29 mmol/L 20  Calcium 8.7 - 10.2 mg/dL 9.1  Total Protein 6.0 - 8.5 g/dL 6.7  Total Bilirubin 0.0 - 1.2 mg/dL <8.6<0.2  Alkaline Phos 39 - 117 IU/L 80  AST 0 - 40 IU/L 8  ALT 0 - 32 IU/L 6    Discharge instruction: per After Visit Summary.  Medications:  Allergies as of 11/23/2018      Reactions  Salmon [fish Allergy] Anaphylaxis   Tongue, lips and finger tips get tingly   Hydrochlorothiazide Other (See Comments)   Elevated creatinine   Latex Itching, Rash      Medication List    STOP taking these medications   Butalbital-APAP-Caffeine 50-325-40 MG capsule   famotidine 20 MG tablet Commonly known as: PEPCID   folic acid 1 MG tablet Commonly known as: FOLVITE   prochlorperazine 10 MG tablet Commonly known as: COMPAZINE     TAKE these medications   amLODipine 5 MG tablet Commonly known as: NORVASC Take 1 tablet (5 mg total) by mouth 2 (two) times a day.   ibuprofen 800 MG tablet Commonly known as: ADVIL Take 1 tablet (800 mg total) by mouth every 8 (eight) hours.   Mapap 500 MG coapsule Generic drug: Acetaminophen Take by mouth.   oxyCODONE 5 MG immediate release tablet Commonly known as: Roxicodone Take 1 tablet (5 mg total) by mouth every 6 (six) hours as needed for up to 5 days for severe pain.   prenatal multivitamin Tabs tablet Take 1 tablet by mouth daily at 12 noon.            Discharge Care Instructions  (From admission, onward)         Start     Ordered   11/23/18 0000  Discharge wound care:    Comments: Keep incision dry, clean.   11/23/18  1111          Diet: routine diet  Activity: Advance as tolerated. Pelvic rest for 6 weeks.   Outpatient follow up: Follow-up Information    Gae Dry, MD. Schedule an appointment as soon as possible for a visit.   Specialty: Obstetrics and Gynecology Why: For post op and staple removal Contact information: [address] [place] [address] [phone]             Postpartum contraception: Depo Provera Rhogam Given postpartum: NA Rubella vaccine given postpartum: Rubella Immune Varicella vaccine given postpartum: Varicella Immune TDaP given antepartum or postpartum: given antepartum  Newborn Data: Live born female  Birth Weight: 9 lb 6.3 oz (4260 g) APGAR: 8, 9  Newborn Delivery   Birth date/time: 11/20/2018 23:20:00 Delivery type: C-Section, Low Transverse C-section categorization: Primary       Baby Feeding: Breast  Disposition:home with mother  SIGNED:  Rod Can, CNM 11/23/2018 11:11 AM

## 2018-11-21 NOTE — Anesthesia Postprocedure Evaluation (Signed)
Anesthesia Post Note  Patient: Debra Bender  Procedure(s) Performed: CESAREAN SECTION (N/A Abdomen)  Patient location during evaluation: Mother Baby Anesthesia Type: Spinal Level of consciousness: oriented and awake and alert Pain management: pain level controlled Vital Signs Assessment: post-procedure vital signs reviewed and stable Respiratory status: spontaneous breathing and respiratory function stable Cardiovascular status: blood pressure returned to baseline and stable Postop Assessment: no headache, no backache, no apparent nausea or vomiting, able to ambulate and spinal receding Anesthetic complications: no     Last Vitals:  Vitals:   11/21/18 0754 11/21/18 0800  BP: 125/78   Pulse: 92 (!) 101  Resp: 18   Temp: 37.1 C   SpO2: 96% 97%    Last Pain:  Vitals:   11/21/18 0754  TempSrc: Oral  PainSc:                  Brantley Fling

## 2018-11-22 ENCOUNTER — Encounter: Payer: Self-pay | Admitting: Certified Nurse Midwife

## 2018-11-22 LAB — CBC
HCT: 22.9 % — ABNORMAL LOW (ref 36.0–46.0)
Hemoglobin: 7.3 g/dL — ABNORMAL LOW (ref 12.0–15.0)
MCH: 27.2 pg (ref 26.0–34.0)
MCHC: 31.9 g/dL (ref 30.0–36.0)
MCV: 85.4 fL (ref 80.0–100.0)
Platelets: 194 10*3/uL (ref 150–400)
RBC: 2.68 MIL/uL — ABNORMAL LOW (ref 3.87–5.11)
RDW: 15.9 % — ABNORMAL HIGH (ref 11.5–15.5)
WBC: 13.2 10*3/uL — ABNORMAL HIGH (ref 4.0–10.5)
nRBC: 0 % (ref 0.0–0.2)

## 2018-11-22 MED ORDER — VITAMIN C 500 MG PO TABS
500.0000 mg | ORAL_TABLET | Freq: Two times a day (BID) | ORAL | Status: DC
  Administered 2018-11-22 – 2018-11-23 (×3): 500 mg via ORAL
  Filled 2018-11-22 (×3): qty 1

## 2018-11-22 NOTE — Progress Notes (Signed)
POD #2 (34hours postop) Primary CS for FTD/CPD Subjective:   Denies lightheadedness when OOB. Foley out yesterday. Is voiding without difficulty. Tolerating regular diet, but not yet passing gas. Baby cluster fed last night.. Some incisional pain, controlled with oral analgesics.  Objective:  Blood pressure 114/75, pulse 76, temperature 97.9 F (36.6 C), temperature source Oral, resp. rate 18, height 5' 4.5" (1.638 m), weight 135.6 kg, last menstrual period 02/03/2018, SpO2 97 %, unknown if currently breastfeeding.  General: WF inNAD Heart: RRR without murmur Pulmonary: no increased work of breathing Abdomen: non-distended, non-tender, fundus firm at level of umbilicus-1FB Incision: Dressing C&D&I, ON Q intact Extremities:  no erythema, no tenderness  Results for orders placed or performed during the hospital encounter of 11/20/18 (from the past 72 hour(s))  CBC     Status: Abnormal   Collection Time: 11/20/18  1:18 AM  Result Value Ref Range   WBC 13.3 (H) 4.0 - 10.5 K/uL   RBC 3.91 3.87 - 5.11 MIL/uL   Hemoglobin 10.6 (L) 12.0 - 15.0 g/dL   HCT 16.132.6 (L) 09.636.0 - 04.546.0 %   MCV 83.4 80.0 - 100.0 fL   MCH 27.1 26.0 - 34.0 pg   MCHC 32.5 30.0 - 36.0 g/dL   RDW 40.915.5 81.111.5 - 91.415.5 %   Platelets 227 150 - 400 K/uL   nRBC 0.0 0.0 - 0.2 %    Comment: Performed at [facility], [address] [place], [place] [address]  RPR     Status: None   Collection Time: 11/20/18  1:18 AM  Result Value Ref Range   RPR Ser Ql Non Reactive Non Reactive    Comment: (NOTE) Performed At: [facility] [place] [address] [place], [place] [id number] Jolene SchimkeNagendra Sanjai MD QI:[phone]   Type and screen     Status: None   Collection Time: 11/20/18  1:49 AM  Result Value Ref Range   ABO/RH(D) B POS    Antibody Screen NEG    Sample Expiration      11/23/2018,2359 Performed at [facility], [address] [place], [place] [address]   CBC     Status: Abnormal   Collection Time:  11/21/18  7:56 AM  Result Value Ref Range   WBC 19.7 (H) 4.0 - 10.5 K/uL   RBC 3.20 (L) 3.87 - 5.11 MIL/uL   Hemoglobin 8.8 (L) 12.0 - 15.0 g/dL   HCT 10.226.9 (L) 72.536.0 - 36.646.0 %   MCV 84.1 80.0 - 100.0 fL   MCH 27.5 26.0 - 34.0 pg   MCHC 32.7 30.0 - 36.0 g/dL   RDW 44.015.5 34.711.5 - 42.515.5 %   Platelets 222 150 - 400 K/uL   nRBC 0.0 0.0 - 0.2 %    Comment: Performed at [facility], [address] [place], [place] [address]  CBC with Differential/Platelet     Status: Abnormal   Collection Time: 11/21/18  5:49 PM  Result Value Ref Range   WBC 14.1 (H) 4.0 - 10.5 K/uL   RBC 2.92 (L) 3.87 - 5.11 MIL/uL   Hemoglobin 8.1 (L) 12.0 - 15.0 g/dL   HCT 75.624.7 (L) 43.336.0 - 29.546.0 %   MCV 84.6 80.0 - 100.0 fL   MCH 27.7 26.0 - 34.0 pg   MCHC 32.8 30.0 - 36.0 g/dL   RDW 18.815.7 (H) 41.611.5 - 60.615.5 %   Platelets 206 150 - 400 K/uL   nRBC 0.0 0.0 - 0.2 %   Neutrophils Relative % 70 %  Neutro Abs 9.8 (H) 1.7 - 7.7 K/uL   Lymphocytes Relative 20 %   Lymphs Abs 2.9 0.7 - 4.0 K/uL   Monocytes Relative 9 %   Monocytes Absolute 1.2 (H) 0.1 - 1.0 K/uL   Eosinophils Relative 0 %   Eosinophils Absolute 0.1 0.0 - 0.5 K/uL   Basophils Relative 0 %   Basophils Absolute 0.0 0.0 - 0.1 K/uL   Immature Granulocytes 1 %   Abs Immature Granulocytes 0.09 (H) 0.00 - 0.07 K/uL    Comment: Performed at [facility], [address] [place], [place] [address]  CBC     Status: Abnormal   Collection Time: 11/22/18  7:25 AM  Result Value Ref Range   WBC 13.2 (H) 4.0 - 10.5 K/uL   RBC 2.68 (L) 3.87 - 5.11 MIL/uL   Hemoglobin 7.3 (L) 12.0 - 15.0 g/dL   HCT 22.9 (L) 36.0 - 46.0 %   MCV 85.4 80.0 - 100.0 fL   MCH 27.2 26.0 - 34.0 pg   MCHC 31.9 30.0 - 36.0 g/dL   RDW 15.9 (H) 11.5 - 15.5 %   Platelets 194 150 - 400 K/uL   nRBC 0.0 0.0 - 0.2 %    Comment: Performed at [facility], [address] [place], [place] [address]     Assessment:   33 y.o. [id number] postoperativeday # 1 1/2:  stable  Continue postoperative/ postpartum care  Ambulate with assist as needed       Plan:  1) Acute blood loss anemia - hemodynamically stable and asymptomatic - po ferrous sulfate/ vitamin C. DIscussed possible blood transfusion if becomes tachycardic or lightheaded. Safety issues discussed.  Explained need to have help when at home and symptoms of fatigue and headache when up for too long.  2) B POS/ RI/ VI  3) TDAP given 09/20/2018  4) Breast/Contraception ?vas  5) Disposition -possible discharge tomorrow   Dalia Heading, CNM

## 2018-11-22 NOTE — Lactation Note (Signed)
This note was copied from a baby's chart. Lactation Consultation Note  Patient Name: Debra Bender ACZYS'A Date: 11/22/2018 Reason for consult: Follow-up assessment   Maternal Data Formula Feeding for Exclusion: No Does the patient have breastfeeding experience prior to this delivery?: Yes Breastfed other children for 4 mths each, last child is 31 yr old Feeding Feeding Type: Breast Fed  LATCH Score Latch: Grasps breast easily, tongue down, lips flanged, rhythmical sucking.  Audible Swallowing: A few with stimulation  Type of Nipple: Everted at rest and after stimulation  Comfort (Breast/Nipple): Soft / non-tender  Hold (Positioning): No assistance needed to correctly position infant at breast.  LATCH Score: 9  Interventions  Offered assistance and name and phone no. Written on white board in mom's room  Lactation Tools Discussed/Used [facility] Program: No   Consult Status Consult Status: PRN Date: 11/22/18 Follow-up type: In-patient    Ferol Luz 11/22/2018, 1:52 PM

## 2018-11-23 LAB — CBC
HCT: 25.1 % — ABNORMAL LOW (ref 36.0–46.0)
Hemoglobin: 7.8 g/dL — ABNORMAL LOW (ref 12.0–15.0)
MCH: 27.1 pg (ref 26.0–34.0)
MCHC: 31.1 g/dL (ref 30.0–36.0)
MCV: 87.2 fL (ref 80.0–100.0)
Platelets: 237 10*3/uL (ref 150–400)
RBC: 2.88 MIL/uL — ABNORMAL LOW (ref 3.87–5.11)
RDW: 16 % — ABNORMAL HIGH (ref 11.5–15.5)
WBC: 12.3 10*3/uL — ABNORMAL HIGH (ref 4.0–10.5)
nRBC: 0 % (ref 0.0–0.2)

## 2018-11-23 MED ORDER — IBUPROFEN 800 MG PO TABS: 800.0000 mg | ORAL_TABLET | Freq: Three times a day (TID) | ORAL | 0 refills | Status: DC

## 2018-11-23 MED ORDER — OXYCODONE HCL 5 MG PO TABS: 5.0000 mg | ORAL_TABLET | Freq: Four times a day (QID) | ORAL | 0 refills | Status: DC | PRN

## 2018-11-23 MED ORDER — MEDROXYPROGESTERONE ACETATE 150 MG/ML IM SUSP
150.0000 mg | INTRAMUSCULAR | Status: DC
  Administered 2018-11-23: 150 mg via INTRAMUSCULAR
  Filled 2018-11-23: qty 1

## 2018-11-23 NOTE — Progress Notes (Signed)
Discharge instructions complete and prescriptions given. RN explained to patient and husband how and when to remove On-Q pump. Patient verbalizes understanding of teaching. Patient discharged home via wheelchair at 1315.

## 2018-11-27 ENCOUNTER — Encounter: Payer: Self-pay | Admitting: Obstetrics & Gynecology

## 2018-11-27 ENCOUNTER — Ambulatory Visit (INDEPENDENT_AMBULATORY_CARE_PROVIDER_SITE_OTHER): Payer: BC Managed Care – PPO | Admitting: Obstetrics & Gynecology

## 2018-11-27 ENCOUNTER — Other Ambulatory Visit: Payer: Self-pay

## 2018-11-27 DIAGNOSIS — Z9889 Other specified postprocedural states: Secondary | ICD-10-CM

## 2018-11-27 NOTE — Progress Notes (Signed)
  Postoperative Follow-up Patient presents post op from CS for FTP, 1 week ago.  Subjective: Patient reports some improvement in her preop symptoms. Eating a regular diet without difficulty. Pain is controlled with current analgesics. Medications being used: ibuprofen (OTC) and narcotic analgesics including Percocet.  Activity: sedentary. Patient reports additional symptom's since surgery of None.  Objective: BP 128/80   Ht 5' 4.5" (1.638 m)   Wt 274 lb (124.3 kg)   BMI 46.31 kg/m  Physical Exam Constitutional:      General: She is not in acute distress.    Appearance: She is well-developed.  Cardiovascular:     Rate and Rhythm: Normal rate.  Pulmonary:     Effort: Pulmonary effort is normal.  Abdominal:     General: There is no distension.     Palpations: Abdomen is soft.     Tenderness: There is no abdominal tenderness.     Comments: Incision Healing Well Some erythema at staple sites   Musculoskeletal: Normal range of motion.  Neurological:     Mental Status: She is alert and oriented to person, place, and time.     Cranial Nerves: No cranial nerve deficit.  Skin:    General: Skin is warm and dry.    Staples removed, steristrips applied  Assessment: s/p :  cesarean section stable  Plan: Patient has done well after surgery with no apparent complications.  I have discussed the post-operative course to date, and the expected progress moving forward.  The patient understands what complications to be concerned about.  I will see the patient in routine follow up, or sooner if needed.    Activity plan: No heavy lifting.Marland Kitchen  Pelvic rest. Depo received in hospital Planning for husband vasectomy  Debra Bender 11/27/2018, 3:15 PM

## 2019-01-09 ENCOUNTER — Other Ambulatory Visit: Payer: Self-pay

## 2019-01-09 ENCOUNTER — Other Ambulatory Visit (HOSPITAL_COMMUNITY)
Admission: RE | Admit: 2019-01-09 | Discharge: 2019-01-09 | Disposition: A | Discharge status: Home / Self Care | Payer: BC Managed Care – PPO | Source: Ambulatory Visit | Attending: Obstetrics & Gynecology | Admitting: Obstetrics & Gynecology

## 2019-01-09 ENCOUNTER — Ambulatory Visit (INDEPENDENT_AMBULATORY_CARE_PROVIDER_SITE_OTHER): Payer: BC Managed Care – PPO | Admitting: Obstetrics & Gynecology

## 2019-01-09 ENCOUNTER — Encounter: Payer: Self-pay | Admitting: Obstetrics & Gynecology

## 2019-01-09 DIAGNOSIS — Z23 Encounter for immunization: Secondary | ICD-10-CM | POA: Diagnosis not present

## 2019-01-09 DIAGNOSIS — Z124 Encounter for screening for malignant neoplasm of cervix: Secondary | ICD-10-CM | POA: Insufficient documentation

## 2019-01-09 DIAGNOSIS — Z1389 Encounter for screening for other disorder: Secondary | ICD-10-CM | POA: Diagnosis not present

## 2019-01-09 MED ORDER — MEDROXYPROGESTERONE ACETATE 150 MG/ML IM SUSP: 150.0000 mg | INTRAMUSCULAR | 3 refills | Status: DC

## 2019-01-09 NOTE — Progress Notes (Signed)
  OBSTETRICS POSTPARTUM CLINIC PROGRESS NOTE  Subjective:     Debra Bender is a 33 y.o. G73P3003 female who presents for a postpartum visit. She is 6 weeks postpartum following a Term pregnancy and delivery by C-section.  I have fully reviewed the prenatal and intrapartum course. Anesthesia: spinal.  Postpartum course has been complicated by uncomplicated.  Baby is feeding by Breast.  Bleeding: patient has not  resumed menses.  Bowel function is normal. Bladder function is normal.  Patient is not sexually active. Contraception method desired is Depo-Provera injections. First dose in hospital. Postpartum depression screening: negative. Edinburgh 6.  The following portions of the patient's history were reviewed and updated as appropriate: allergies, current medications, past family history, past medical history, past social history, past surgical history and problem list.  Review of Systems Pertinent items are noted in HPI.  Objective:    BP 120/80   Ht 5\' 4"  (1.626 m)   Wt 261 lb (118.4 kg)   BMI 44.80 kg/m   General:  alert and no distress   Breasts:  inspection negative, no nipple discharge or bleeding, no masses or nodularity palpable  Lungs: clear to auscultation bilaterally  Heart:  regular rate and rhythm, S1, S2 normal, no murmur, click, rub or gallop  Abdomen: soft, non-tender; bowel sounds normal; no masses,  no organomegaly.  Well healed Pfannenstiel incision   Vulva:  normal  Vagina: normal vagina, no discharge, exudate, lesion, or erythema  Cervix:  no cervical motion tenderness and no lesions  Corpus: normal size, contour, position, consistency, mobility, non-tender  Adnexa:  normal adnexa and no mass, fullness, tenderness  Rectal Exam: Not performed.          Assessment:  Post Partum Care visit 1. Need for immunization against influenza - Flu Vaccine QUAD 36+ mos IM  2. Postpartum care following cesarean delivery Hgb check today 10.8 Cont PNV while  breast feeding  3. Screening for cervical cancer PAP   Plan:  See orders and Patient Instructions Contraceptive counseling for Depo-Provera Follow up in: 6 weeks (Depo shot, eRx done) and as needed.  PAP today, annual next year  Barnett Applebaum, MD, [facility], [facility] 01/09/2019  10:33 AM

## 2019-01-14 LAB — CYTOLOGY - PAP
Diagnosis: NEGATIVE
High risk HPV: NEGATIVE

## 2019-01-24 ENCOUNTER — Other Ambulatory Visit: Payer: Self-pay | Admitting: Advanced Practice Midwife

## 2019-01-24 DIAGNOSIS — O0990 Supervision of high risk pregnancy, unspecified, unspecified trimester: Secondary | ICD-10-CM

## 2019-02-20 ENCOUNTER — Ambulatory Visit (INDEPENDENT_AMBULATORY_CARE_PROVIDER_SITE_OTHER): Payer: BC Managed Care – PPO

## 2019-02-20 ENCOUNTER — Other Ambulatory Visit: Payer: Self-pay

## 2019-02-20 DIAGNOSIS — Z3042 Encounter for surveillance of injectable contraceptive: Secondary | ICD-10-CM | POA: Diagnosis not present

## 2019-02-20 MED ORDER — MEDROXYPROGESTERONE ACETATE 150 MG/ML IM SUSP
150.0000 mg | Freq: Once | INTRAMUSCULAR | Status: AC
  Administered 2019-02-20: 150 mg via INTRAMUSCULAR

## 2019-02-20 NOTE — Progress Notes (Signed)
Pt here for depo inj which was given IM right deltoid.  NDC# [address]-1 

## 2019-05-15 ENCOUNTER — Ambulatory Visit: Payer: BC Managed Care – PPO

## 2019-05-16 ENCOUNTER — Other Ambulatory Visit: Payer: Self-pay

## 2019-05-16 ENCOUNTER — Ambulatory Visit (INDEPENDENT_AMBULATORY_CARE_PROVIDER_SITE_OTHER): Payer: BC Managed Care – PPO

## 2019-05-16 DIAGNOSIS — Z3042 Encounter for surveillance of injectable contraceptive: Secondary | ICD-10-CM | POA: Diagnosis not present

## 2019-05-16 MED ORDER — MEDROXYPROGESTERONE ACETATE 150 MG/ML IM SUSP
150.0000 mg | Freq: Once | INTRAMUSCULAR | Status: AC
  Administered 2019-05-16: 150 mg via INTRAMUSCULAR

## 2019-05-16 NOTE — Patient Instructions (Signed)
Depo Provera given in Right Deltoid. Pt tolerated well

## 2019-08-08 ENCOUNTER — Ambulatory Visit: Payer: Medicaid Other

## 2019-08-11 ENCOUNTER — Ambulatory Visit: Payer: Medicaid Other

## 2019-08-12 ENCOUNTER — Ambulatory Visit (INDEPENDENT_AMBULATORY_CARE_PROVIDER_SITE_OTHER): Payer: BC Managed Care – PPO

## 2019-08-12 ENCOUNTER — Other Ambulatory Visit: Payer: Self-pay

## 2019-08-12 DIAGNOSIS — Z3042 Encounter for surveillance of injectable contraceptive: Secondary | ICD-10-CM | POA: Diagnosis not present

## 2019-08-12 MED ORDER — MEDROXYPROGESTERONE ACETATE 150 MG/ML IM SUSP
150.0000 mg | Freq: Once | INTRAMUSCULAR | Status: AC
  Administered 2019-08-12: 150 mg via INTRAMUSCULAR

## 2019-08-12 NOTE — Patient Instructions (Signed)
Pt here for Depo Provera, given left deltoid. Tolerated well

## 2019-11-04 ENCOUNTER — Ambulatory Visit: Payer: Self-pay

## 2019-12-24 ENCOUNTER — Other Ambulatory Visit: Payer: Self-pay

## 2019-12-24 ENCOUNTER — Encounter: Payer: Self-pay | Admitting: Otolaryngology

## 2019-12-30 ENCOUNTER — Other Ambulatory Visit: Payer: Self-pay

## 2019-12-30 ENCOUNTER — Other Ambulatory Visit
Admission: RE | Admit: 2019-12-30 | Discharge: 2019-12-30 | Disposition: A | Discharge status: Home / Self Care | Payer: BC Managed Care – PPO | Source: Ambulatory Visit | Attending: Otolaryngology | Admitting: Otolaryngology

## 2019-12-30 DIAGNOSIS — Z01812 Encounter for preprocedural laboratory examination: Secondary | ICD-10-CM | POA: Insufficient documentation

## 2019-12-30 DIAGNOSIS — Z20822 Contact with and (suspected) exposure to covid-19: Secondary | ICD-10-CM | POA: Insufficient documentation

## 2019-12-30 LAB — SARS CORONAVIRUS 2 (TAT 6-24 HRS): SARS Coronavirus 2: NEGATIVE

## 2020-01-01 NOTE — Progress Notes (Signed)
Procedure canceled today due to NPO status.  Dr. Elenore Rota aware and his office will reschedule.  I evaluated the patient's airway as she is just beyond the BMI cutoff for [facility].  MP-1, TMD>3CM, full ROM.  Approved for procedure at [facility] when rescheduled.

## 2020-01-08 ENCOUNTER — Encounter: Payer: Self-pay | Admitting: Otolaryngology

## 2020-01-08 ENCOUNTER — Other Ambulatory Visit: Payer: Self-pay

## 2020-01-13 ENCOUNTER — Other Ambulatory Visit
Admission: RE | Admit: 2020-01-13 | Discharge: 2020-01-13 | Disposition: A | Discharge status: Home / Self Care | Payer: BC Managed Care – PPO | Source: Ambulatory Visit | Attending: Otolaryngology | Admitting: Otolaryngology

## 2020-01-13 ENCOUNTER — Other Ambulatory Visit: Payer: Self-pay

## 2020-01-13 DIAGNOSIS — Z01818 Encounter for other preprocedural examination: Secondary | ICD-10-CM | POA: Insufficient documentation

## 2020-01-13 DIAGNOSIS — Z20822 Contact with and (suspected) exposure to covid-19: Secondary | ICD-10-CM | POA: Insufficient documentation

## 2020-01-14 LAB — SARS CORONAVIRUS 2 (TAT 6-24 HRS): SARS Coronavirus 2: NEGATIVE

## 2020-01-15 ENCOUNTER — Encounter: Payer: Self-pay | Admitting: Anesthesiology

## 2020-01-15 ENCOUNTER — Encounter: Payer: Self-pay | Admitting: Otolaryngology

## 2020-01-15 ENCOUNTER — Other Ambulatory Visit: Payer: Self-pay

## 2020-01-15 ENCOUNTER — Ambulatory Visit
Admission: RE | Admit: 2020-01-15 | Discharge: 2020-01-15 | Disposition: A | Discharge status: Home / Self Care | Payer: BC Managed Care – PPO | Attending: Otolaryngology | Admitting: Otolaryngology

## 2020-01-15 ENCOUNTER — Ambulatory Visit
Admission: RE | Disposition: A | Discharge status: Home / Self Care | Payer: Self-pay | Source: Home / Self Care | Attending: Otolaryngology

## 2020-01-15 DIAGNOSIS — Z87891 Personal history of nicotine dependence: Secondary | ICD-10-CM | POA: Diagnosis not present

## 2020-01-15 DIAGNOSIS — Z01812 Encounter for preprocedural laboratory examination: Secondary | ICD-10-CM | POA: Diagnosis not present

## 2020-01-15 DIAGNOSIS — Z79899 Other long term (current) drug therapy: Secondary | ICD-10-CM | POA: Diagnosis not present

## 2020-01-15 DIAGNOSIS — J351 Hypertrophy of tonsils: Secondary | ICD-10-CM | POA: Diagnosis not present

## 2020-01-15 DIAGNOSIS — I10 Essential (primary) hypertension: Secondary | ICD-10-CM | POA: Insufficient documentation

## 2020-01-15 DIAGNOSIS — J988 Other specified respiratory disorders: Secondary | ICD-10-CM | POA: Insufficient documentation

## 2020-01-15 HISTORY — PX: TONSILLECTOMY: SHX5217

## 2020-01-15 LAB — POCT PREGNANCY, URINE: Preg Test, Ur: NEGATIVE

## 2020-01-15 SURGERY — TONSILLECTOMY
Anesthesia: General | Site: Throat | Laterality: Bilateral

## 2020-01-15 MED ORDER — FENTANYL CITRATE (PF) 100 MCG/2ML IJ SOLN
INTRAMUSCULAR | Status: DC | PRN
Start: 2020-01-15 — End: 2020-01-15
  Administered 2020-01-15 (×2): 50 ug via INTRAVENOUS

## 2020-01-15 MED ORDER — FENTANYL CITRATE (PF) 100 MCG/2ML IJ SOLN
25.0000 ug | INTRAMUSCULAR | Status: DC | PRN
  Administered 2020-01-15 (×2): 25 ug via INTRAVENOUS

## 2020-01-15 MED ORDER — LACTATED RINGERS IV SOLN: INTRAVENOUS | Status: DC

## 2020-01-15 MED ORDER — OXYCODONE HCL 5 MG/5ML PO SOLN
5.0000 mg | Freq: Once | ORAL | Status: AC | PRN
  Administered 2020-01-15: 5 mg via ORAL

## 2020-01-15 MED ORDER — MIDAZOLAM HCL 5 MG/5ML IJ SOLN
INTRAMUSCULAR | Status: DC | PRN
  Administered 2020-01-15: 2 mg via INTRAVENOUS

## 2020-01-15 MED ORDER — GLYCOPYRROLATE 0.2 MG/ML IJ SOLN
INTRAMUSCULAR | Status: DC | PRN
  Administered 2020-01-15: .1 mg via INTRAVENOUS

## 2020-01-15 MED ORDER — SCOPOLAMINE 1 MG/3DAYS TD PT72
1.0000 | MEDICATED_PATCH | Freq: Once | TRANSDERMAL | Status: DC
  Administered 2020-01-15: 1.5 mg via TRANSDERMAL

## 2020-01-15 MED ORDER — SUCCINYLCHOLINE CHLORIDE 20 MG/ML IJ SOLN
INTRAMUSCULAR | Status: DC | PRN
  Administered 2020-01-15: 100 mg via INTRAVENOUS

## 2020-01-15 MED ORDER — ACETAMINOPHEN 10 MG/ML IV SOLN
1000.0000 mg | Freq: Once | INTRAVENOUS | Status: AC
  Administered 2020-01-15: 1000 mg via INTRAVENOUS

## 2020-01-15 MED ORDER — OXYCODONE HCL 5 MG PO TABS: 5.0000 mg | ORAL_TABLET | Freq: Once | ORAL | Status: AC | PRN

## 2020-01-15 MED ORDER — ONDANSETRON HCL 4 MG/2ML IJ SOLN: 4.0000 mg | Freq: Once | INTRAMUSCULAR | Status: DC | PRN

## 2020-01-15 MED ORDER — LIDOCAINE HCL (CARDIAC) PF 100 MG/5ML IV SOSY
PREFILLED_SYRINGE | INTRAVENOUS | Status: DC | PRN
  Administered 2020-01-15: 50 mg via INTRAVENOUS

## 2020-01-15 MED ORDER — ONDANSETRON HCL 4 MG/2ML IJ SOLN
INTRAMUSCULAR | Status: DC | PRN
  Administered 2020-01-15: 4 mg via INTRAVENOUS

## 2020-01-15 MED ORDER — DEXAMETHASONE SODIUM PHOSPHATE 4 MG/ML IJ SOLN
INTRAMUSCULAR | Status: DC | PRN
  Administered 2020-01-15: 10 mg via INTRAVENOUS

## 2020-01-15 MED ORDER — HYDROCODONE-ACETAMINOPHEN 7.5-325 MG/15ML PO SOLN: 15.0000 mL | ORAL | 0 refills | Status: AC | PRN

## 2020-01-15 MED ORDER — PROPOFOL 10 MG/ML IV BOLUS
INTRAVENOUS | Status: DC | PRN
  Administered 2020-01-15: 30 mg via INTRAVENOUS
  Administered 2020-01-15: 150 mg via INTRAVENOUS

## 2020-01-15 SURGICAL SUPPLY — 13 items
BLADE BOVIE TIP EXT 4 (BLADE) ×3 IMPLANT
CANISTER SUCT 1200ML W/VALVE (MISCELLANEOUS) ×3 IMPLANT
ELECT REM PT RETURN 9FT ADLT (ELECTROSURGICAL) ×3
ELECTRODE REM PT RTRN 9FT ADLT (ELECTROSURGICAL) ×1 IMPLANT
GLOVE PI ULTRA LF STRL 7.5 (GLOVE) ×1 IMPLANT
GLOVE PI ULTRA NON LATEX 7.5 (GLOVE) ×2
KIT TURNOVER KIT A (KITS) ×3 IMPLANT
PACK TONSIL AND ADENOID CUSTOM (PACKS) ×3 IMPLANT
PENCIL SMOKE EVACUATOR (MISCELLANEOUS) ×3 IMPLANT
SLEEVE SUCTION 125 (MISCELLANEOUS) ×3 IMPLANT
SOL ANTI-FOG 6CC FOG-OUT (MISCELLANEOUS) ×1 IMPLANT
SOL FOG-OUT ANTI-FOG 6CC (MISCELLANEOUS) ×2
STRAP BODY AND KNEE 60X3 (MISCELLANEOUS) ×3 IMPLANT

## 2020-01-15 NOTE — Anesthesia Procedure Notes (Signed)
Procedure Name: Intubation Date/Time: 01/15/2020 8:50 AM Performed by: Jimmy Picket, CRNA Pre-anesthesia Checklist: Patient identified, Emergency Drugs available, Suction available, Patient being monitored and Timeout performed Patient Re-evaluated:Patient Re-evaluated prior to induction Oxygen Delivery Method: Circle system utilized Preoxygenation: Pre-oxygenation with 100% oxygen Induction Type: IV induction Ventilation: Mask ventilation without difficulty Laryngoscope Size: Feggins and 2 Grade View: Grade I Tube type: Oral Rae Tube size: 7.0 mm Number of attempts: 1 Placement Confirmation: ETT inserted through vocal cords under direct vision,  positive ETCO2 and breath sounds checked- equal and bilateral Tube secured with: Tape Dental Injury: Teeth and Oropharynx as per pre-operative assessment

## 2020-01-15 NOTE — Op Note (Signed)
01/15/2020  9:16 AM    Debra Bender  [id number]   Pre-Op Dx: Airway obstruction secondary to enlarged tonsils  Post-op Dx: Same  Proc: Tonsillectomy with trimming of the uvula  Surg:  Debra Bender  Anes:  GOT  EBL: 20 mL  Comp: None  Findings: Huge tonsils near touching the midline.  These were not cryptic or have any signs of infection.   Procedure: Patient was brought to the operating room and placed in the supine position.  She was given general anesthesia by oral endotracheal intubation.  Once patient was asleep a Vernelle Emerald was used for visualization of the oropharynx.  The tonsils were massive and near touching the midline.  The soft palate was retracted and the nasopharynx was visualized.  There is no adenoid tissue here because of any obstruction in the nasopharynx.  The left tonsil was grasped and pulled medially.  The anterior pillar was incised using electrocautery.  The tonsil was dissected from its fossa using blunt dissection and electrocautery.  Bleeding was controlled with direct pressure and electrocautery.  The procedure was then repeated on the right tonsil with using electrocautery to incise the anterior pillar and then it was dissected from its fossa.  Bleeding was controlled again with direct pressure and electrocautery.  There was no significant blood loss.  A soft suction used to suction the stomach of an ounce of clear liquids.  The patient was awakened and taken to the recovery room in satisfactory condition.  There were no operative complications.  She tolerated the procedure very well.  Dispo:   To PACU to be discharged home  Plan: To follow-up in the office in a couple weeks to make sure she is doing well.  She will push liquids at home.  She has been given some Tylenol with codeine liquid to use at home for pain which she can supplement with Tylenol or ibuprofen after several days.  Debra Bender  01/15/2020 9:16 AM

## 2020-01-15 NOTE — Transfer of Care (Signed)
Immediate Anesthesia Transfer of Care Note  Patient: Debra Bender  Procedure(s) Performed: TONSILLECTOMY (Bilateral Throat)  Patient Location: PACU  Anesthesia Type: General ETT  Level of Consciousness: awake, alert  and patient cooperative  Airway and Oxygen Therapy: Patient Spontanous Breathing and Patient connected to supplemental oxygen  Post-op Assessment: Post-op Vital signs reviewed, Patient's Cardiovascular Status Stable, Respiratory Function Stable, Patent Airway and No signs of Nausea or vomiting  Post-op Vital Signs: Reviewed and stable  Complications: No complications documented.

## 2020-01-15 NOTE — H&P (Signed)
H&P has been reviewed and patient reevaluated, no changes necessary. To be downloaded later.  

## 2020-01-15 NOTE — Anesthesia Postprocedure Evaluation (Signed)
Anesthesia Post Note  Patient: Debra Bender  Procedure(s) Performed: TONSILLECTOMY (Bilateral Throat)     Patient location during evaluation: PACU Anesthesia Type: General Level of consciousness: awake and alert Pain management: pain level controlled Vital Signs Assessment: post-procedure vital signs reviewed and stable Respiratory status: spontaneous breathing Cardiovascular status: stable Anesthetic complications: no   No complications documented.  Marvis Repress

## 2020-01-15 NOTE — Discharge Instructions (Signed)
T & A INSTRUCTION SHEET - [facility]  PAUL JUENGEL, MD  [address] , [place] [address] [place].  [phone]  INFORMATION SHEET FOR A TONSILLECTOMY AND ADENDOIDECTOMY  About Your Tonsils and Adenoids  The tonsils and adenoids are normal body tissues that are part of our immune system.  They normally help to protect us against diseases that may enter our mouth and nose. However, sometimes the tonsils and/or adenoids become too large and obstruct our breathing, especially at night.    If either of these things happen it helps to remove the tonsils and adenoids in order to become healthier. The operation to remove the tonsils and adenoids is called a tonsillectomy and adenoidectomy.  The Location of Your Tonsils and Adenoids  The tonsils are located in the back of the throat on both side and sit in a cradle of muscles. The adenoids are located in the roof of the mouth, behind the nose, and closely associated with the opening of the Eustachian tube to the ear.  Surgery on Tonsils and Adenoids  A tonsillectomy and adenoidectomy is a short operation which takes about thirty minutes.  This includes being put to sleep and being awakened. Tonsillectomies and adenoidectomies are performed at [facility] and may require observation period in the recovery room prior to going home. Children are required to remain in recovery for at least 45 minutes.   Following the Operation for a Tonsillectomy  A cautery machine is used to control bleeding. Bleeding from a tonsillectomy and adenoidectomy is minimal and postoperatively the risk of bleeding is approximately four percent, although this rarely life threatening.  After your tonsillectomy and adenoidectomy post-op care at home: 1. Our patients are able to go home the same day. You may be given prescriptions for pain medications, if indicated. 2. It is extremely important to  remember that fluid intake is of utmost importance after a tonsillectomy. The amount that you drink must be maintained in the postoperative period. A good indication of whether a child is getting enough fluid is whether his/her urine output is constant. As long as children are urinating or wetting their diaper every 6 - 8 hours this is usually enough fluid intake.   3. Although rare, this is a risk of some bleeding in the first ten days after surgery. This usually occurs between day five and nine postoperatively. This risk of bleeding is approximately four percent. If you or your child should have any bleeding you should remain calm and notify our office or go directly to the emergency room at [facility] where they will contact us. Our doctors are available seven days a week for notification. We recommend sitting up quietly in a chair, place an ice pack on the front of the neck and spitting out the blood gently until we are able to contact you. Adults should gargle gently with ice water and this may help stop the bleeding. If the bleeding does not stop after a short time, i.e. 10 to 15 minutes, or seems to be increasing again, please contact us or go to the hospital.   4. It is common for the pain to be worse at 5 - 7 days postoperatively. This occurs because the "scab" is peeling off and the mucous membrane (skin of the throat) is growing back where the tonsils were.   5. It is common for a low-grade fever, less than 102, during the first week   after a tonsillectomy and adenoidectomy. It is usually due to not drinking enough liquids, and we suggest your use liquid Tylenol (acetaminophen) or the pain medicine with Tylenol (acetaminophen) prescribed in order to keep your temperature below 102. Please follow the directions on the back of the bottle. 6. Recommendations for post-operative pain in children and adults: a) For Children 12 and younger: Recommendations are for oral Tylenol  (acetaminophen) and oral Motrin (ibuprofen). Administer the Tylenol (acetaminophen) and Motrin as stated on bottle for patient's age/weight. Sometimes it may be necessary to alternate the Tylenol (acetaminophen) and Motrin for improved pain control. Motrin (ibuprofen) does last slightly longer so many patients benefit from being given this prior to bedtime. All children should avoid Aspirin products for 2 weeks following surgery. b) For children over the age of 12: Tylenol (acetaminophen) is the preferred first choice for pain control. Depending on your child's size, sometimes they will be given a combination of Tylenol (acetaminophen) and hydrocodone medication or sometimes it will be recommended they take Motrin (ibuprofen) in addition to the Tylenol (acetaminophen). Narcotics should always be used with caution in children following surgery as they can suppress their breathing and switching to over the counter Tylenol (acetaminophen) and Motrin (ibuprofen) as soon as possible is recommended. All patients should avoid Aspirin products for 2 weeks following surgery. c) Adults: Usually adults will require a narcotic pain medication following a tonsillectomy. This usually has either hydrocodone or oxycodone in it and can usually be taken every 4 to 6 hours as needed for moderate pain. If the medication does not have Tylenol (acetaminophen) in it, you may also supplement Tylenol (acetaminophen) as needed every 4 to 6 hours for breakthrough or mild pain. Adults should avoid Aspirin, Aleve, Motrin, and Ibuprofen products for 2 weeks following surgery as they can increase your risk of bleeding. 7. If you happen to look in the mirror or into your child's mouth you will see white/gray patches on the back of the throat. This is what a scab looks like in the mouth and is normal after having a tonsillectomy and adenoidectomy. They will disappear once the tonsil areas heal completely. However, it may cause a noticeable odor,  and this too will disappear with time.     8. You or your child may experience ear pain after having a tonsillectomy and adenoidectomy.  This is called referred pain and comes from the throat, but it is felt in the ears.  Ear pain is quite common and expected. It will usually go away after ten days. There is usually nothing wrong with the ears, and it is primarily due to the healing area stimulating the nerve to the ear that runs along the side of the throat. Use either the prescribed pain medicine or Tylenol (acetaminophen) as needed.  9. The throat tissues after a tonsillectomy are obviously sensitive. Smoking around children who have had a tonsillectomy significantly increases the risk of bleeding. DO NOT SMOKE!  What to Expect Each Day  First Day at Home 1. Patients will be discharged home the same day.  2. Drink at least four glasses of liquid a day. Clear, cool liquids are recommended. Fruit juices containing citric acid are not recommended because they tend to cause pain. Carbonated beverages are allowed if you pour them from glass to glass to remove the bubbles as these tend to cause discomfort. Avoid alcoholic beverages.  3. Eat very soft foods such as soups, broth, jello, custard, pudding, ice cream, popsicles, applesauce, mashed potatoes,   and in general anything that you can crush between your tongue and the roof of your mouth. Try adding Carnation Instant Breakfast Mix into your food for extra calories. It is not uncommon to lose 5 to 10 pounds of fluid weight. The weight will be gained back quickly once you're feeling better and drinking more.  4. Sleep with your head elevated on two pillows for about three days to help decrease the swelling.  5. DO NOT SMOKE!  Day Two  1. Rest as much as possible. Use common sense in your activities.  2. Continue drinking at least four glasses of liquid per day.  3. Follow the soft diet.  4. Use your pain medication as needed.  Day Three  1. Advance  your activity as you are able and continue to follow the previous day's suggestions.  Days Four Through Six  1. Advance your diet and begin to eat more solid foods such as chopped hamburger. 2. Advance your activities slowly. Children should be kept mostly around the house.  3. Not uncommonly, there will be more pain at this time. It is temporary, usually lasting a day or two.  Day Seven Through Ten  1. Most individuals by this time are able to return to work or school unless otherwise instructed. Consider sending children back to school for a half day on the first day back.   General Anesthesia, Adult, Care After This sheet gives you information about how to care for yourself after your procedure. Your health care provider may also give you more specific instructions. If you have problems or questions, contact your health care provider. What can I expect after the procedure? After the procedure, the following side effects are common:  Pain or discomfort at the IV site.  Nausea.  Vomiting.  Sore throat.  Trouble concentrating.  Feeling cold or chills.  Weak or tired.  Sleepiness and fatigue.  Soreness and body aches. These side effects can affect parts of the body that were not involved in surgery. Follow these instructions at home:  For at least 24 hours after the procedure:  Have a responsible adult stay with you. It is important to have someone help care for you until you are awake and alert.  Rest as needed.  Do not: ? Participate in activities in which you could fall or become injured. ? Drive. ? Use heavy machinery. ? Drink alcohol. ? Take sleeping pills or medicines that cause drowsiness. ? Make important decisions or sign legal documents. ? Take care of children on your own. Eating and drinking  Follow any instructions from your health care provider about eating or drinking restrictions.  When you feel hungry, start by eating small amounts of foods that are  soft and easy to digest (bland), such as toast. Gradually return to your regular diet.  Drink enough fluid to keep your urine pale yellow.  If you vomit, rehydrate by drinking water, juice, or clear broth. General instructions  If you have sleep apnea, surgery and certain medicines can increase your risk for breathing problems. Follow instructions from your health care provider about wearing your sleep device: ? Anytime you are sleeping, including during daytime naps. ? While taking prescription pain medicines, sleeping medicines, or medicines that make you drowsy.  Return to your normal activities as told by your health care provider. Ask your health care provider what activities are safe for you.  Take over-the-counter and prescription medicines only as told by your health care provider.  If   you smoke, do not smoke without supervision.  Keep all follow-up visits as told by your health care provider. This is important. Contact a health care provider if:  You have nausea or vomiting that does not get better with medicine.  You cannot eat or drink without vomiting.  You have pain that does not get better with medicine.  You are unable to pass urine.  You develop a skin rash.  You have a fever.  You have redness around your IV site that gets worse. Get help right away if:  You have difficulty breathing.  You have chest pain.  You have blood in your urine or stool, or you vomit blood. Summary  After the procedure, it is common to have a sore throat or nausea. It is also common to feel tired.  Have a responsible adult stay with you for the first 24 hours after general anesthesia. It is important to have someone help care for you until you are awake and alert.  When you feel hungry, start by eating small amounts of foods that are soft and easy to digest (bland), such as toast. Gradually return to your regular diet.  Drink enough fluid to keep your urine pale  yellow.  Return to your normal activities as told by your health care provider. Ask your health care provider what activities are safe for you. This information is not intended to replace advice given to you by your health care provider. Make sure you discuss any questions you have with your health care provider. Document Revised: 03/23/2017 Document Reviewed: 11/03/2016 Elsevier Patient Education  2020 Elsevier Inc.  Scopolamine skin patches Remove in 72 hrs. Wash hands immediately after removal. What is this medicine? SCOPOLAMINE (skoe POL a meen) is used to prevent nausea and vomiting caused by motion sickness, anesthesia and surgery. This medicine may be used for other purposes; ask your health care provider or pharmacist if you have questions. COMMON BRAND NAME(S): Transderm Scop What should I tell my health care provider before I take this medicine? They need to know if you have any of these conditions:  are scheduled to have a gastric secretion test  glaucoma  heart disease  kidney disease  liver disease  lung or breathing disease, like asthma  mental illness  prostate disease  seizures  stomach or intestine problems  trouble passing urine  an unusual or allergic reaction to scopolamine, atropine, other medicines, foods, dyes, or preservatives  pregnant or trying to get pregnant  breast-feeding How should I use this medicine? This medicine is for external use only. Follow the directions on the prescription label. Wear only 1 patch at a time. Choose an area behind the ear, that is clean, dry, hairless and free from any cuts or irritation. Wipe the area with a clean dry tissue. Peel off the plastic backing of the skin patch, trying not to touch the adhesive side with your hands. Do not cut the patches. Firmly apply to the area you have chosen, with the metallic side of the patch to the skin and the tan-colored side showing. Once firmly in place, wash your hands well  with soap and water. Do not get this medicine into your eyes. After removing the patch, wash your hands and the area behind your ear thoroughly with soap and water. The patch will still contain some medicine after use. To avoid accidental contact or ingestion by children or pets, fold the used patch in half with the sticky side together and throw   away in the trash out of the reach of children and pets. If you need to use a second patch after you remove the first, place it behind the other ear. A special MedGuide will be given to you by the pharmacist with each prescription and refill. Be sure to read this information carefully each time. Talk to your pediatrician regarding the use of this medicine in children. Special care may be needed. Overdosage: If you think you have taken too much of this medicine contact a poison control center or emergency room at once. NOTE: This medicine is only for you. Do not share this medicine with others. What if I miss a dose? This does not apply. This medicine is not for regular use. What may interact with this medicine?  alcohol  antihistamines for allergy cough and cold  atropine  certain medicines for anxiety or sleep  certain medicines for bladder problems like oxybutynin, tolterodine  certain medicines for depression like amitriptyline, fluoxetine, sertraline  certain medicines for stomach problems like dicyclomine, hyoscyamine  certain medicines for Parkinson's disease like benztropine, trihexyphenidyl  certain medicines for seizures like phenobarbital, primidone  general anesthetics like halothane, isoflurane, methoxyflurane, propofol  ipratropium  local anesthetics like lidocaine, pramoxine, tetracaine  medicines that relax muscles for surgery  phenothiazines like chlorpromazine, mesoridazine, prochlorperazine, thioridazine  narcotic medicines for pain  other belladonna alkaloids This list may not describe all possible interactions.  Give your health care provider a list of all the medicines, herbs, non-prescription drugs, or dietary supplements you use. Also tell them if you smoke, drink alcohol, or use illegal drugs. Some items may interact with your medicine. What should I watch for while using this medicine? Limit contact with water while swimming and bathing because the patch may fall off. If the patch falls off, throw it away and put a new one behind the other ear. You may get drowsy or dizzy. Do not drive, use machinery, or do anything that needs mental alertness until you know how this medicine affects you. Do not stand or sit up quickly, especially if you are an older patient. This reduces the risk of dizzy or fainting spells. Alcohol may interfere with the effect of this medicine. Avoid alcoholic drinks. Your mouth may get dry. Chewing sugarless gum or sucking hard candy, and drinking plenty of water may help. Contact your healthcare professional if the problem does not go away or is severe. This medicine may cause dry eyes and blurred vision. If you wear contact lenses, you may feel some discomfort. Lubricating drops may help. See your healthcare professional if the problem does not go away or is severe. If you are going to need surgery, an MRI, CT scan, or other procedure, tell your healthcare professional that you are using this medicine. You may need to remove the patch before the procedure. What side effects may I notice from receiving this medicine? Side effects that you should report to your doctor or health care professional as soon as possible:  allergic reactions like skin rash, itching or hives; swelling of the face, lips, or tongue  blurred vision  changes in vision  confusion  dizziness  eye pain  fast, irregular heartbeat  hallucinations, loss of contact with reality  nausea, vomiting  pain or trouble passing urine  restlessness  seizures  skin irritation  stomach pain Side effects  that usually do not require medical attention (report to your doctor or health care professional if they continue or are bothersome):  drowsiness    dry mouth  headache  sore throat This list may not describe all possible side effects. Call your doctor for medical advice about side effects. You may report side effects to FDA at 1-800-FDA-1088. Where should I keep my medicine? Keep out of the reach of children. Store at room temperature between 20 and 25 degrees C (68 and 77 degrees F). Keep this medicine in the foil package until ready to use. Throw away any unused medicine after the expiration date. NOTE: This sheet is a summary. It may not cover all possible information. If you have questions about this medicine, talk to your doctor, pharmacist, or health care provider.  2020 Elsevier/Gold Standard (2017-06-08 16:14:46)   

## 2020-01-15 NOTE — Anesthesia Preprocedure Evaluation (Signed)
Anesthesia Evaluation  Patient identified by MRN, date of birth, ID band Patient awake    Reviewed: Allergy & Precautions, H&P , NPO status , Patient's Chart, lab work & pertinent test results  History of Anesthesia Complications Negative for: history of anesthetic complications  Airway Mallampati: I  TM Distance: >3 FB Neck ROM: full    Dental no notable dental hx.    Pulmonary neg pulmonary ROS, former smoker,    Pulmonary exam normal        Cardiovascular hypertension, On Medications Normal cardiovascular exam Rhythm:regular Rate:Normal     Neuro/Psych negative neurological ROS     GI/Hepatic Neg liver ROS, Medicated,  Endo/Other  Hypothyroidism   Renal/GU negative Renal ROS  negative genitourinary   Musculoskeletal   Abdominal   Peds  Hematology  (+) Blood dyscrasia, anemia ,   Anesthesia Other Findings   Reproductive/Obstetrics                             Anesthesia Physical Anesthesia Plan  ASA: II  Anesthesia Plan: General ETT   Post-op Pain Management:    Induction:   PONV Risk Score and Plan:   Airway Management Planned:   Additional Equipment:   Intra-op Plan:   Post-operative Plan:   Informed Consent: I have reviewed the patients History and Physical, chart, labs and discussed the procedure including the risks, benefits and alternatives for the proposed anesthesia with the patient or authorized representative who has indicated his/her understanding and acceptance.       Plan Discussed with:   Anesthesia Plan Comments:         Anesthesia Quick Evaluation

## 2020-01-16 ENCOUNTER — Encounter: Payer: Self-pay | Admitting: Otolaryngology

## 2020-01-16 LAB — SURGICAL PATHOLOGY

## 2021-09-19 ENCOUNTER — Other Ambulatory Visit: Payer: Self-pay

## 2022-02-07 ENCOUNTER — Ambulatory Visit: Payer: BC Managed Care – PPO | Attending: Family Medicine | Admitting: Physical Therapy

## 2022-02-07 ENCOUNTER — Encounter: Payer: Self-pay | Admitting: Physical Therapy

## 2022-02-07 DIAGNOSIS — M6281 Muscle weakness (generalized): Secondary | ICD-10-CM | POA: Diagnosis not present

## 2022-02-07 DIAGNOSIS — R278 Other lack of coordination: Secondary | ICD-10-CM | POA: Insufficient documentation
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
## 2022-02-13 ENCOUNTER — Ambulatory Visit: Payer: BC Managed Care – PPO | Admitting: Physical Therapy

## 2022-02-13 ENCOUNTER — Encounter: Payer: Self-pay | Admitting: Physical Therapy

## 2022-02-13 DIAGNOSIS — M6281 Muscle weakness (generalized): Secondary | ICD-10-CM | POA: Diagnosis not present

## 2022-02-13 DIAGNOSIS — R278 Other lack of coordination: Secondary | ICD-10-CM
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
## 2022-03-02 ENCOUNTER — Ambulatory Visit: Payer: BC Managed Care – PPO | Admitting: Physical Therapy

## 2022-03-02 ENCOUNTER — Encounter: Payer: Self-pay | Admitting: Physical Therapy

## 2022-03-02 DIAGNOSIS — M6281 Muscle weakness (generalized): Secondary | ICD-10-CM

## 2022-03-02 DIAGNOSIS — R278 Other lack of coordination: Secondary | ICD-10-CM
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
## 2022-03-09 ENCOUNTER — Ambulatory Visit: Payer: BC Managed Care – PPO | Attending: Family Medicine | Admitting: Physical Therapy

## 2022-03-15 ENCOUNTER — Ambulatory Visit: Payer: BC Managed Care – PPO | Admitting: Physical Therapy

## 2022-03-22 ENCOUNTER — Ambulatory Visit: Payer: BC Managed Care – PPO | Admitting: Physical Therapy

## 2022-03-28 ENCOUNTER — Ambulatory Visit (INDEPENDENT_AMBULATORY_CARE_PROVIDER_SITE_OTHER): Payer: BC Managed Care – PPO | Admitting: Licensed Clinical Social Worker

## 2022-03-28 DIAGNOSIS — F3341 Major depressive disorder, recurrent, in partial remission: Secondary | ICD-10-CM

## 2022-03-28 DIAGNOSIS — F4323 Adjustment disorder with mixed anxiety and depressed mood: Secondary | ICD-10-CM
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
## 2022-04-05 ENCOUNTER — Ambulatory Visit: Payer: BC Managed Care – PPO | Admitting: Physical Therapy

## 2022-04-11 ENCOUNTER — Ambulatory Visit: Payer: BC Managed Care – PPO | Admitting: Physical Therapy

## 2022-04-12 ENCOUNTER — Ambulatory Visit: Payer: BC Managed Care – PPO | Attending: Family Medicine | Admitting: Physical Therapy

## 2022-04-12 ENCOUNTER — Encounter: Payer: Self-pay | Admitting: Physical Therapy

## 2022-04-12 DIAGNOSIS — M6281 Muscle weakness (generalized): Secondary | ICD-10-CM | POA: Diagnosis present

## 2022-04-12 DIAGNOSIS — R278 Other lack of coordination: Secondary | ICD-10-CM | POA: Diagnosis not present
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
## 2022-04-19 ENCOUNTER — Ambulatory Visit (INDEPENDENT_AMBULATORY_CARE_PROVIDER_SITE_OTHER): Payer: BC Managed Care – PPO | Admitting: Licensed Clinical Social Worker

## 2022-04-19 DIAGNOSIS — F3341 Major depressive disorder, recurrent, in partial remission: Secondary | ICD-10-CM | POA: Diagnosis not present

## 2022-04-19 DIAGNOSIS — F4323 Adjustment disorder with mixed anxiety and depressed mood: Secondary | ICD-10-CM

## 2022-04-19 NOTE — Progress Notes (Addendum)
THERAPIST PROGRESS NOTE  Session Time: 4:00pm-4:57pm   Participation Level: Active  Behavioral Response: Well GroomedAlertDepressed  Type of Therapy: Individual Therapy  Treatment Goals addressed: Depression: Reduce overall frequency, intensity and duration of depression so that daily functioning is not impaired per pt self report 3 out of 5 sessions documented.   Intervention: Will work with pt to learn and implement calming skills to reduce overall depression and manage depression symptoms. Some of the techniques that will be used during session will be deep breathing exercises, visual imagery, progressive muscle relaxation, postreduction relative to the benefits of self-care and other breathing exercises.   ProgressTowards Goals: Progressing  Interventions: CBT and Supportive  Pt presented in person at [facility] office. Pt and LCSW were present during the visit.    Summary: Debra Bender is a 37 y.o. female who presents with continuing symptoms of depression. Pt stated that she has been having feelings of feeling overwhelmed and not feeling motivated. Pt stated that she watcher her children and that she does not have much help from her husband in terms of taking time for self-care when he is not at work.   Pt was able to explore in session ways that she could cope with her feelings of depression and was able to explore what she enjoys doing. Pt stated that she could use the mindful deep breathing exercise. Pt was able to explore situations that she was not in control of and was able to explore that she can only control how she responds and feels when she is interacting with her husband.   Allowed pt to explore thoughts and feelings associated with life situations and external stressors. Encouraged expression of feelings and used empathic listening. Pt was oriented to time, place and situation. LCSW validated the pts feelings and thoughts and showed  unconditional positive regard.   Pt was able to explore how her thoughts impact her behaviors and feelings. Pt was able to explore ways that she could help with coping with the stressors she is encountering.   LCSW provided mood monitoring and treatment progress review in the context of this episode of treatment. LCSW reviewed the pt's mood status since last session.  GAD-7 on 03/28/2022 was a score of 6 and on 04/19/2022 score was a 4. PHQ-9 score on 03/28/2022 was a 11 and on 04/19/2022 it was a score of 7.   Discussed with the pt the transition of a new therapist and provided the pt with the choice of continuing services with the new therapist and answered any questions that the pt had about the transition.  Pt agreed to seeing the new therapist.   Pt denies SI/HI or A/V hallucinations. Pt was cooperative during visit and was engaged throughout the visit. Pt does not report any other concerns at the time of visit.     Suicidal/Homicidal: Nowithout intent/plan  Therapist Response: Educated on the importance of healthy coping skills with the pt. Explored the benefits of healthy coping skills and engaged the pt to discuss ways that they are coping with anxiety and depression. Encouraged the pt to explore new ways to help alleviate anxiety and determined new ways to help with distraction and coping in session. Discussed the benefits of exercise as a way of coping with anxiety and stress. Explored different ways that the pt could cope to include listening to music, being in nature, trying a new hobby and other activities that the pt could try to help with coping.  Educated on mindful deep breathing with the pt. Explored how the technique could help alleviate anxiety. Engaged with the pt to inhale for four seconds, then hold their air in their lungs for four seconds and then slowly exhale for six seconds. Encouraged the pt to practice the mindful deep breathing at home or any place where they feel that  they need to take a few minutes to focus on breathing and being in the moment. Discussed with the pt how the technique could be effective in helping with anxiety and is a discreet way to help them stay grounded.   LCSW used CBT techniques to explore how thoughts,feelings and beliefs impact behavioral responses and physical symptoms. Explored how stressors can lead to negative thoughts that can be irrational or exaggerated which in return impacts how the person feels and can negatively impact how their body responds to include fatigue, sleep problems, poor concentration and loss of motivation. Explored how a behavioral response can create new stressors. Engaged with the pt to explore how their thoughts and feelings impact their behaviors and choices.    Continued Recommendations as followed: Self-care behaviors, positive social engagements, focusing on positive physical and emotional wellness, and focusing on life/work balance.    Plan: Return again on 05/04/2022.   Diagnosis:  Encounter Diagnoses  Name Primary?   Adjustment disorder with mixed anxiety and depressed mood Yes   Depression, major, recurrent, in partial remission ([place])      Collaboration of Care: Pt requested a referral to see psychiatrist. Discussed with Debra Bender to schedule a visit for the pt.   Patient/Guardian was advised Release of Information must be obtained prior to any record release in order to collaborate their care with an outside provider. Patient/Guardian was advised if they have not already done so to contact the registration department to sign all necessary forms in order for [place] to release information regarding their care.   Consent: Patient/Guardian gives verbal consent for treatment and assignment of benefits for services provided during this visit. Patient/Guardian expressed understanding and agreed to proceed.   Debra Bender 04/19/2022

## 2022-05-04 ENCOUNTER — Ambulatory Visit: Payer: BC Managed Care – PPO | Admitting: Licensed Clinical Social Worker

## 2022-05-09 ENCOUNTER — Encounter: Payer: Self-pay | Admitting: Physical Therapy

## 2022-05-09 ENCOUNTER — Ambulatory Visit: Payer: BC Managed Care – PPO | Attending: Family Medicine | Admitting: Physical Therapy

## 2022-05-09 DIAGNOSIS — R278 Other lack of coordination: Secondary | ICD-10-CM | POA: Diagnosis present

## 2022-05-09 DIAGNOSIS — M6281 Muscle weakness (generalized): Secondary | ICD-10-CM | POA: Insufficient documentation
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
## 2022-06-14 ENCOUNTER — Encounter: Payer: BC Managed Care – PPO | Admitting: Physical Therapy

## 2022-06-19 ENCOUNTER — Ambulatory Visit (INDEPENDENT_AMBULATORY_CARE_PROVIDER_SITE_OTHER): Payer: BC Managed Care – PPO | Admitting: Licensed Clinical Social Worker

## 2022-06-19 DIAGNOSIS — F3341 Major depressive disorder, recurrent, in partial remission: Secondary | ICD-10-CM | POA: Diagnosis not present

## 2022-06-19 DIAGNOSIS — F4323 Adjustment disorder with mixed anxiety and depressed mood: Secondary | ICD-10-CM | POA: Diagnosis not present

## 2022-06-20 ENCOUNTER — Encounter: Payer: BC Managed Care – PPO | Admitting: Physical Therapy
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
## 2022-06-27 ENCOUNTER — Encounter: Payer: BC Managed Care – PPO | Admitting: Physical Therapy

## 2022-07-05 ENCOUNTER — Encounter: Payer: Self-pay | Admitting: Otolaryngology

## 2022-07-05 ENCOUNTER — Ambulatory Visit: Payer: BC Managed Care – PPO | Admitting: Psychiatry

## 2022-07-07 NOTE — Anesthesia Preprocedure Evaluation (Signed)
Anesthesia Evaluation  Patient identified by MRN, date of birth, ID band Patient awake    Reviewed: Allergy & Precautions, H&P , NPO status , Patient's Chart, lab work & pertinent test results  Airway Mallampati: I  TM Distance: <3 FB Neck ROM: Full    Dental  (+) Chipped, Poor Dentition   Pulmonary Patient abstained from smoking., former smoker Quit smoking 7 years ago but vapes now   Pulmonary exam normal breath sounds clear to auscultation       Cardiovascular hypertension, Normal cardiovascular exam Rhythm:Regular Rate:Normal     Neuro/Psych  PSYCHIATRIC DISORDERS Anxiety Depression    PTSD per Epic hxnegative neurological ROS  negative psych ROS   GI/Hepatic Neg liver ROS,GERD  Controlled and Medicated,,gastritis   Endo/Other  negative endocrine ROSHypothyroidism  Morbid obesityBMI about 41.6  Renal/GU negative Renal ROS  negative genitourinary   Musculoskeletal negative musculoskeletal ROS (+)    Abdominal   Peds negative pediatric ROS (+)  Hematology negative hematology ROS (+) Blood dyscrasia, anemia   Anesthesia Other Findings Hx Obesity affecting pregnancy  Anemia Hypertension  GERD (gastroesophageal reflux disease) Hypothyroidism  PTSD  Reproductive/Obstetrics negative OB ROS                             Anesthesia Physical Anesthesia Plan  ASA: 2  Anesthesia Plan: General ETT   Post-op Pain Management:    Induction: Intravenous  PONV Risk Score and Plan:   Airway Management Planned: LMA  Additional Equipment:   Intra-op Plan:   Post-operative Plan: Extubation in OR  Informed Consent: I have reviewed the patients History and Physical, chart, labs and discussed the procedure including the risks, benefits and alternatives for the proposed anesthesia with the patient or authorized representative who has indicated his/her understanding and acceptance.      Dental Advisory Given  Plan Discussed with: Anesthesiologist, CRNA and Surgeon  Anesthesia Plan Comments: (Patient consented for risks of anesthesia including but not limited to:  - adverse reactions to medications - damage to eyes, teeth, lips or other oral mucosa - nerve damage due to positioning  - sore throat or hoarseness - Damage to heart, brain, nerves, lungs, other parts of body or loss of life  Patient voiced understanding.)       Anesthesia Quick Evaluation

## 2022-07-10 ENCOUNTER — Telehealth: Payer: Self-pay | Admitting: Licensed Clinical Social Worker

## 2022-07-10 ENCOUNTER — Ambulatory Visit (INDEPENDENT_AMBULATORY_CARE_PROVIDER_SITE_OTHER): Payer: Self-pay | Admitting: Licensed Clinical Social Worker

## 2022-07-10 DIAGNOSIS — Z91199 Patient's noncompliance with other medical treatment and regimen due to unspecified reason: Secondary | ICD-10-CM

## 2022-07-10 NOTE — Progress Notes (Signed)
LCSW called pt at 4:06PM to inquire about attendance at today's appt. Pt reported being confused on appt date and thought appt was tomorrow. Pt informed of consistent Monday schedule pt and LCSW decided on at previous appt. Reminded pt of next appt on 4/22. Pt reported no urgent mental health concerns.  

## 2022-07-10 NOTE — Telephone Encounter (Signed)
LCSW called pt at 4:06PM to inquire about attendance at today's appt. Pt reported being confused on appt date and thought appt was tomorrow. Pt informed of consistent Monday schedule pt and LCSW decided on at previous appt. Reminded pt of next appt on 4/22. Pt reported no urgent mental health concerns.

## 2022-07-12 ENCOUNTER — Encounter: Payer: Self-pay | Admitting: Otolaryngology

## 2022-07-13 ENCOUNTER — Ambulatory Visit
Admission: RE | Admit: 2022-07-13 | Discharge: 2022-07-13 | Disposition: A | Discharge status: Home / Self Care | Payer: BC Managed Care – PPO | Attending: Otolaryngology | Admitting: Otolaryngology

## 2022-07-13 ENCOUNTER — Other Ambulatory Visit: Payer: Self-pay

## 2022-07-13 ENCOUNTER — Encounter
Admission: RE | Disposition: A | Discharge status: Home / Self Care | Payer: Self-pay | Source: Home / Self Care | Attending: Otolaryngology

## 2022-07-13 ENCOUNTER — Encounter: Payer: Self-pay | Admitting: Otolaryngology

## 2022-07-13 ENCOUNTER — Ambulatory Visit: Payer: BC Managed Care – PPO | Admitting: Anesthesiology

## 2022-07-13 DIAGNOSIS — Z87891 Personal history of nicotine dependence: Secondary | ICD-10-CM | POA: Insufficient documentation

## 2022-07-13 DIAGNOSIS — F4310 Post-traumatic stress disorder, unspecified: Secondary | ICD-10-CM | POA: Diagnosis not present

## 2022-07-13 DIAGNOSIS — Z09 Encounter for follow-up examination after completed treatment for conditions other than malignant neoplasm: Secondary | ICD-10-CM | POA: Diagnosis not present

## 2022-07-13 DIAGNOSIS — F418 Other specified anxiety disorders: Secondary | ICD-10-CM | POA: Diagnosis not present

## 2022-07-13 DIAGNOSIS — Z6841 Body Mass Index (BMI) 40.0 and over, adult: Secondary | ICD-10-CM | POA: Diagnosis not present

## 2022-07-13 DIAGNOSIS — Z419 Encounter for procedure for purposes other than remedying health state, unspecified: Secondary | ICD-10-CM

## 2022-07-13 DIAGNOSIS — D1039 Benign neoplasm of other parts of mouth: Secondary | ICD-10-CM | POA: Insufficient documentation

## 2022-07-13 DIAGNOSIS — K1379 Other lesions of oral mucosa: Secondary | ICD-10-CM | POA: Diagnosis present

## 2022-07-13 DIAGNOSIS — K219 Gastro-esophageal reflux disease without esophagitis: Secondary | ICD-10-CM | POA: Insufficient documentation

## 2022-07-13 HISTORY — PX: MINOR EXCISION OF ORAL LESION: SHX6466

## 2022-07-13 HISTORY — DX: Depression, unspecified: F32.A

## 2022-07-13 HISTORY — DX: Post-traumatic stress disorder, unspecified: F43.10

## 2022-07-13 LAB — POCT PREGNANCY, URINE: Preg Test, Ur: NEGATIVE

## 2022-07-13 SURGERY — MINOR EXCISION OF ORAL LESION
Anesthesia: General | Site: Mouth | Laterality: Left

## 2022-07-13 MED ORDER — MIDAZOLAM HCL 5 MG/5ML IJ SOLN
INTRAMUSCULAR | Status: DC | PRN
  Administered 2022-07-13: 2 mg via INTRAVENOUS

## 2022-07-13 MED ORDER — ONDANSETRON HCL 4 MG/2ML IJ SOLN
INTRAMUSCULAR | Status: DC | PRN
  Administered 2022-07-13: 4 mg via INTRAVENOUS

## 2022-07-13 MED ORDER — FENTANYL CITRATE (PF) 100 MCG/2ML IJ SOLN
INTRAMUSCULAR | Status: DC | PRN
  Administered 2022-07-13 (×2): 50 ug via INTRAVENOUS

## 2022-07-13 MED ORDER — LACTATED RINGERS IV SOLN: INTRAVENOUS | Status: DC

## 2022-07-13 MED ORDER — LIDOCAINE-EPINEPHRINE 1 %-1:100000 IJ SOLN
INTRAMUSCULAR | Status: DC | PRN
  Administered 2022-07-13: 2 mL

## 2022-07-13 MED ORDER — LIDOCAINE HCL (CARDIAC) PF 100 MG/5ML IV SOSY
PREFILLED_SYRINGE | INTRAVENOUS | Status: DC | PRN
  Administered 2022-07-13: 30 mg via INTRATRACHEAL

## 2022-07-13 MED ORDER — PROPOFOL 10 MG/ML IV BOLUS
INTRAVENOUS | Status: DC | PRN
  Administered 2022-07-13: 200 mg via INTRAVENOUS

## 2022-07-13 MED ORDER — DEXAMETHASONE SODIUM PHOSPHATE 4 MG/ML IJ SOLN
INTRAMUSCULAR | Status: DC | PRN
  Administered 2022-07-13: 4 mg via INTRAVENOUS

## 2022-07-13 MED ORDER — HYDROCODONE-ACETAMINOPHEN 5-325 MG PO TABS: 1.0000 | ORAL_TABLET | Freq: Four times a day (QID) | ORAL | 0 refills | Status: AC | PRN

## 2022-07-13 SURGICAL SUPPLY — 13 items
CANISTER SUCT 1200ML W/VALVE (MISCELLANEOUS) IMPLANT
COVER MAYO STAND STRL (DRAPES) IMPLANT
CUP MEDICINE 1OZ GRADU PLASTIC (MISCELLANEOUS) IMPLANT
DRAPE TOWEL STERILE LF 18X24 (DRAPES) IMPLANT
GLOVE SURG GAMMEX PI TX LF 7.5 (GLOVE) ×2 IMPLANT
KIT TURNOVER KIT A (KITS) ×1 IMPLANT
MARKER SKIN DUAL TIP RULER LAB (MISCELLANEOUS) IMPLANT
NDL 27GX1/2 REG BEVEL ECLIP (NEEDLE) ×1 IMPLANT
NEEDLE 27GX1/2 REG BEVEL ECLIP (NEEDLE) ×1 IMPLANT
STRAP BODY AND KNEE 60X3 (MISCELLANEOUS) ×1 IMPLANT
SUT CHROMIC 4 0 RB 1X27 (SUTURE) IMPLANT
SYR 3ML LL SCALE MARK (SYRINGE) ×1 IMPLANT
TUBING CONNECTING 10 (TUBING) IMPLANT

## 2022-07-13 NOTE — H&P (Signed)
H&P has been reviewed and patient reevaluated, no changes necessary. To be downloaded later.  

## 2022-07-13 NOTE — Transfer of Care (Signed)
Immediate Anesthesia Transfer of Care Note  Patient: Debra Bender  Procedure(s) Performed: EXCISION LESION ORAL VESTIBULE (Left: Mouth)  Patient Location: PACU  Anesthesia Type: General ETT  Level of Consciousness: awake, alert  and patient cooperative  Airway and Oxygen Therapy: Patient Spontanous Breathing and Patient connected to supplemental oxygen  Post-op Assessment: Post-op Vital signs reviewed, Patient's Cardiovascular Status Stable, Respiratory Function Stable, Patent Airway and No signs of Nausea or vomiting  Post-op Vital Signs: Reviewed and stable  Complications: No notable events documented.

## 2022-07-13 NOTE — Anesthesia Postprocedure Evaluation (Signed)
Anesthesia Post Note  Patient: Debra Bender  Procedure(s) Performed: EXCISION LESION ORAL VESTIBULE (Left: Mouth)  Patient location during evaluation: PACU Anesthesia Type: General Level of consciousness: awake and alert Pain management: pain level controlled Vital Signs Assessment: post-procedure vital signs reviewed and stable Respiratory status: spontaneous breathing, nonlabored ventilation, respiratory function stable and patient connected to nasal cannula oxygen Cardiovascular status: blood pressure returned to baseline and stable Postop Assessment: no apparent nausea or vomiting Anesthetic complications: no   No notable events documented.   Last Vitals:  Vitals:   07/13/22 1005 07/13/22 1010  BP:  111/84  Pulse: 79 80  Resp: 16 16  Temp:  36.8 C  SpO2: 96% 92%    Last Pain:  Vitals:   07/13/22 0849  TempSrc: Temporal  PainSc: 3                  Debra Bender

## 2022-07-13 NOTE — Op Note (Signed)
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  She has been biting this accidentally while chewing but fairly consistently because it so big.  This been there for a couple years.  Procedure: Patient was given general anesthesia by laryngeal mask anesthesia once anesthesia was completed a gauze was placed down into her left cheek to hold it out a little bit and collect any blood that might ooze there.  2 mL of 1% Xylocaine with epi 1: 100,000 were used for infiltration of the mucosa around the lesion.  This was anterior and inferior to the Stensen's duct at the left cheek.  An ellipse was created around this with a scalpel.  The ellipse was created in a horizontal fashion to leave a horizontal suture line.  Incision was carried through the mucosa down to the muscle layer.  Using sharp scissors the mucosa above it was separated from the underlying muscle.  The mass was removed and sent for permanent section.  There is no significant bleeding but minor ooze.  The wound was then closed using 4-0 chromic's in an interrupted fashion.  These were done inverted so the knot was buried in the wound.  Four individual sutures were placed to complete closure of the wound.  The gauze was then removed.  There is no further bleeding.  Patient was awakened and taken to the recovery room in satisfactory condition.  There were no operative complications.  She tolerated the procedure very well.  Dispo:   To PACU to be discharged home  Plan: To follow-up in the office in 1 week to make sure the wound is healing well.  She will drink lots of liquids  for now and slowly increase her diet as tolerated.  I have given her a few Tylenol with hydrocodone 5/325 mg tabs to use for pain or she can just use Tylenol or ibuprofen.  Beverly Sessions Demarr Kluever  07/13/2022 9:52 AM

## 2022-07-13 NOTE — Anesthesia Procedure Notes (Signed)
Procedure Name: LMA Insertion Date/Time: 07/13/2022 9:28 AM  Performed by: Andee Poles, CRNAPre-anesthesia Checklist: Patient identified, Emergency Drugs available, Suction available, Timeout performed and Patient being monitored Patient Re-evaluated:Patient Re-evaluated prior to induction Oxygen Delivery Method: Circle system utilized Preoxygenation: Pre-oxygenation with 100% oxygen Induction Type: IV induction LMA: LMA inserted LMA Size: 4.0 Number of attempts: 1 Placement Confirmation: positive ETCO2 and breath sounds checked- equal and bilateral Tube secured with: Tape

## 2022-07-14 LAB — SURGICAL PATHOLOGY

## 2022-07-19 ENCOUNTER — Ambulatory Visit: Payer: BC Managed Care – PPO | Admitting: Physical Therapy

## 2022-07-24 ENCOUNTER — Ambulatory Visit (INDEPENDENT_AMBULATORY_CARE_PROVIDER_SITE_OTHER): Payer: BC Managed Care – PPO | Admitting: Licensed Clinical Social Worker

## 2022-07-24 DIAGNOSIS — F3341 Major depressive disorder, recurrent, in partial remission: Secondary | ICD-10-CM

## 2022-07-24 DIAGNOSIS — F4323 Adjustment disorder with mixed anxiety and depressed mood: Secondary | ICD-10-CM | POA: Diagnosis not present

## 2022-07-27 ENCOUNTER — Ambulatory Visit: Payer: BC Managed Care – PPO | Attending: Family Medicine | Admitting: Physical Therapy

## 2022-07-27 DIAGNOSIS — R278 Other lack of coordination: Secondary | ICD-10-CM

## 2022-07-27 DIAGNOSIS — M6281 Muscle weakness (generalized): Secondary | ICD-10-CM | POA: Insufficient documentation

## 2022-07-27 NOTE — Patient Instructions (Signed)
  Proper body mechanics with getting out of a chair to decrease strain  on back &pelvic floor   Avoid holding your breath when Getting out of the chair:  Scoot to front part of chair chair Heels behind knees, feet are hip width apart, nose over toes  Inhale like you are smelling roses Exhale to stand    __  Minisquat: FOR LIFTING as WELL Scoot buttocks back slight, hinge like you are looking at your reflection on a pond  Knees behind toes,  Inhale to "smell flowers"  Exhale on the rise "like rocket"  Do not lock knees, have more weight across ballmounds of feet, toes relaxed and spread them, not grip them   10 reps x 3 x day   __    Lengthen Back rib by L  shoulder ( winging)    Lie on R  side , pillow between knees and under head  Pull  L arm overhead over mattress, grab the edge of mattress,pull it upward, drawing elbow away from ears  Breathing 10 reps  Brushing arm with 3/4 turn onto pillow behind back  Lying on R  side ,Pillow/ Block between knees     dragging top forearm across ribs below breast rotating 3/4 turn,  rotating  _L_ only this week ,  relax onto the pillow behind the back  and then back to other palm , maintain top palm on body whole top and not lift shoulder  ___

## 2022-07-27 NOTE — Therapy (Signed)
OUTPATIENT PHYSICAL THERAPY FEMALE PELVIC TREATMENT  / Recert    Patient Name: Debra Bender MRN: [id number] DOB:11/01/1985, 37 y.o., female Today's Date: 07/27/2022   PT End of Session - 07/27/22 1636     Visit Number 6    Number of Visits 12    Date for PT Re-Evaluation 08/01/22    Authorization Type IE 02/07/2022    PT Start Time 1630    PT Stop Time 1715    PT Time Calculation (min) 45 min    Activity Tolerance Patient tolerated treatment well    Behavior During Therapy [facility] for tasks assessed/performed             Past Medical History:  Diagnosis Date   Anemia 12/2014   Depression    GERD (gastroesophageal reflux disease)    Hypertension    Hypothyroidism    during pregnancy   Obesity affecting pregnancy    PTSD (post-traumatic stress disorder)    Past Surgical History:  Procedure Laterality Date   CESAREAN SECTION N/A 11/20/2018   Procedure: CESAREAN SECTION;  Surgeon: Nadara Mustard, MD;  Location: [facility] ORS;  Service: Obstetrics;  Laterality: N/A;  [id number] JYN:WGNF [id number] WT [id number]   FRACTURE SURGERY Right 37 years old   MINOR EXCISION OF ORAL LESION Left 07/13/2022   Procedure: EXCISION LESION ORAL VESTIBULE;  Surgeon: Vernie Murders, MD;  Location: [facility];  Service: ENT;  Laterality: Left;   TONSILLECTOMY Bilateral 01/15/2020   Procedure: TONSILLECTOMY;  Surgeon: Vernie Murders, MD;  Location: [facility];  Service: ENT;  Laterality: Bilateral;  Latex   Patient Active Problem List   Diagnosis Date Noted   Postpartum care following cesarean delivery 11/23/2018   Encounter for planned induction of labor 11/20/2018   Chronic hypertension affecting pregnancy 07/04/2018   BMI 45.0-49.9, adult 04/04/2018   Obesity affecting pregnancy, antepartum 04/04/2018   Family history of congenital heart disease    Supervision of high risk pregnancy, antepartum 10/12/2016   Gastroesophageal reflux disease without esophagitis 04/17/2016    Essential hypertension 02/16/2016   History of thyroid disorder 11/01/2015   Hypothyroidism 02/08/2015    PCP: Rolm Gala, MD  REFERRING PROVIDER: Ardyth Man, PA-C  REFERRING DIAG: Stress incontinence of urine  THERAPY DIAG:  Other lack of coordination  Muscle weakness (generalized)  Rationale for Evaluation and Treatment Rehabilitation  PRECAUTIONS: None  WEIGHT BEARING RESTRICTIONS No  FALLS:  Has patient fallen in last 6 months? No  ONSET DATE: adolescence  SUBJECTIVE:           Pt has difficulty with starting and completing bowel movements . Pt strains 50% of the time with bowel movements  CHIEF COMPLAINT: Patient reports that leakage is very disruptive. Patient notes her UI has gotten so bad that at her child's birthday party when coughing she had leakage that ran down her leg. Patient is an every day vaper.    PERTINENT HISTORY/CHART REVIEW:  Red flags (bowel/bladder changes, saddle paresthesia, personal history of cancer, h/o spinal tumors, h/o compression fx, h/o abdominal aneurysm, abdominal pain, chills/fever, night sweats, nausea, vomiting, unrelenting pain, first onset of insidious LBP <20 y/o): Negative   PAIN:  Patient notes occasional pain in knees and ankles; denies pelvic or LBP.    OCCUPATION/LEISURE ACTIVITIES:  Caregiving; crafting; nature trails  PLOF:  Independent  PATIENT GOALS: Back to the amount of leakage that was present in adolescence.    [facility] PT Assessment - 07/27/22 1640       Observation/Other Assessments   Scoliosis L shoulder lowered, R iliac crest lowered, L lumbar convex curve ( pt holds her toddlers (36 lbs) on her R hip      Squat   Comments knees more anterior to toes      Strength   Overall Strength Comments Bhip flex/knee ext/flex:   R 4-/5, L 5/5      Palpation   Spinal mobility hypomobility at upper thoracic/ T/L junction , medial scapula mm attachments L             [facility] Adult PT Treatment/Exercise - 07/27/22 1701       Therapeutic Activites    Therapeutic Activities Other Therapeutic Activities    Other Therapeutic Activities cued for sit to stand and minisquat for lifting baby and against gravity task with less pressure on pelvic floor      Neuro Re-ed    Neuro Re-ed Details  cued for thoracic mobility HEP to realign spine and pelvis      Manual Therapy   Manual therapy comments STM/MWM at problem areas noted in assessment               Clinical Impression:  Pt has achieved 2/6 goals and progressing well towards remaining goals. Pt has been transferred from another pelvic PT.  Addressing structural asymmetries before teaching deep core strengthening to minimize leakage and bowel issues. Pelvic girdle and shoulder was realigned post Tx today. Pt tolerated manual Tx without pain. C section scars will be address at upcoming session.   Patient will benefit from continued skilled therapeutic intervention to address remaining deficits in PFM coordination, PFM strength, and IAP management in order to increase function and improve overall QOL.   Objective impairments: decreased activity tolerance, decreased coordination, decreased endurance, decreased strength, and improper body mechanics.   Activity limitations: cleaning, laundry, interpersonal relationship, community activity, occupation, and yard work.   Personal factors: Behavior pattern, Fitness, Past/current experiences, Time since onset of injury/illness/exacerbation, and 3+ comorbidities: Depression, hypothyroidism, anxiety, HTN, GERD  are also affecting patient's functional outcome.   Rehab Potential: Good  Clinical decision making: Evolving/moderate complexity  Evaluation complexity: Moderate   GOALS: Goals reviewed with patient?  Yes  SHORT TERM GOALS: Target date: 03/21/2022  Patient will demonstrate independence with HEP in order to maximize therapeutic gains and improve carryover from physical therapy sessions to ADLs in the home and community. Baseline: not initiated; 2/6: IND Goal status: MET    LONG TERM GOALS: Target date: 10/06/2022    Patient will demonstrate improved function as evidenced by a score of > 5 pts  on FOTO measure for full participation in activities at home and in the  community.  Baseline: 41; 2/6: 59 ,  07/27/22 :  52  Goal status: MET   2. Patient will demonstrate improved function as evidenced by a score of > 5 pts  on PFDI Urinary measure for full participation in activities at home and in the community.  Baseline: 58; 2/6: 13  07/27/22 :  25 Goal status:MET   3.  Patient will demonstrate circumferential and sequential contraction of >3/5 MMT, > 5 sec hold x5 and 5 consecutive quick flicks with </= 10 min rest between testing bouts, and relaxation of the PFM coordinated with breath for improved management of intra-abdominal pressure and normal bowel and bladder function without the presence of pain nor incontinence in order to improve participation at home and in the community.  Baseline: not formally assessed; 2/6: 3/5 MMT, x3 quick flicks (adequate relaxation) Goal status: IN PROGRESS  4. Pt will demo decreased C section r / perineal restrictions in order to progress to deep core exercises for co-activation of pelvic floor and TrA  Baseline:  increased scar restrictions  Goal status: NEW   5.  Pt will report being able to start and complete bowel movements  and straining < 50% of the time  Baseline:  not able to   start and complete bowel movements  Goal status: NEW    6. Pt will demo levelled pelvic girdle and shoulder height in order to progress to deep core strengthening HEP and restore mobility at spine, pelvis, gait, posture   Baseline:L shoulder lowered, R iliac crest  lowered, L lumbar convex curve Goal Status: NEW     PLAN: Rehab frequency: 1x/week  Rehab duration: 10 weeks  Planned interventions: Therapeutic exercises, Therapeutic activity, Neuromuscular re-education, Balance training, Gait training, Patient/Family education, Self Care, Joint mobilization, Orthotic/Fit training, Electrical stimulation, Spinal mobilization, Cryotherapy, Moist heat, scar mobilization, Taping, and Manual therapy   Mariane Masters, DPT, PT  07/27/22   4:28 PM

## 2022-08-02 NOTE — Progress Notes (Signed)
THERAPIST PROGRESS NOTE  Session Time: 4:15PM-4:57PM  Participation Level: Active  Behavioral Response: Casual, Neat, and Well GroomedAlertDepressed  Type of Therapy: Individual Therapy  Treatment Goals addressed:  Reduce frequency, intensity, and duration of depression symptoms so that daily functioning is improved.  Increase coping skills to manage depression and improve ability to perform daily activities.  Recall traumatic events without becoming overwhelmed with negative emotions.   ProgressTowards Goals: Progressing  Interventions: CBT, DBT, Motivational Interviewing, Strength-based, and Other: ACT  Summary: Debra Bender is a 37 y.o. female who presents with continued sxs of depression and anxiety including but not limited to fatigue, lack of motivation, lack of interest, irritability, isolation, depressed mood, worry, and difficulty controlling worry. Pt oriented to person, place, and time. Pt denies SI/HI or A/V hallucinations. Pt was cooperative during visit and was engaged throughout the visit. Pt does not report any other concerns at the time of visit.  Pt reported that she is currently living with her husband and 3 kids. 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 Pt identified barriers to working being childcare costs. Pt reported that she has applied for vouchers. Provided pt with [facility].   Pt provided insight into her hx of trauma that resulted in adultification. 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 Pt identified feeling under supported at home and feeling that that is contributing to her burnout. Pt engaged in conversation re: metaphor of trash in the park-if we always clean up the trash people drop in the park, they never see the messes that they make and therefore have no reason to cease a behavior. Metaphor utilized to encourage  accountability while helping by giving others time to see the impacts of their choices before assuming responsibility for their mess. Normalized difficulty in editing a behavior that pt has engaged in for the majority of her life.   Pt reported having marital difficulties in part due to different upbringings that she and her husband have had and also due to interference from spouse's family. Pt shared that there is a lack of intimacy between her and her husband due to different approaches to similar core values. Invited pt to reflect on shared and conflicting values to assist LCSW and pt in exploring other conversational skills in future sessions. Validated that spouses are allowed to be different. Discussed concept of love being education.   LCSW provided mood monitoring and treatment progress review in the context of this episode of treatment. LCSW reviewed the pt's mood status since last session.   Continued Recommendations as followed: Self-care behaviors, positive social engagements, focusing on positive physical and emotional wellness, and focusing on life/work balance.   Suicidal/Homicidal: Nowithout intent/plan  Therapist Response:  Provided pt education re: acceptance. Discussed how to make acceptance accessible at all parts of pt's healing journey.    Approached pt with strengths based perspective to assist pt in exploring strengths in moments of feeling low.   LCSW introduced pt to Acceptance and Commitment Therapy. Pt engaged in discussion on how to explore what they must accept in order to commit to what they have identified as important. Introduced pt to values directed goal exploration in order to identify goals of importance.   LCSW utilized therapeutic conversation skills informed by CBT, DBT, and ACT to expose pt to multiple ways of thinking about healing and to provide pt  to access to multiple interventions.   LCSW introduced pt to Acceptance and Commitment Therapy. Pt engaged  in discussion on how to explore what they must accept in order to commit to what they have identified as important. Introduced pt to values directed goal exploration in order to identify goals of importance.    Plan: Return again in 3 weeks.  Diagnosis: Adjustment disorder with mixed anxiety and depressed mood  Depression, major, recurrent, in partial remission (HCC)    07/24/2022    4:58 PM 06/20/2022    7:57 AM 04/20/2022   10:15 AM  Depression screen PHQ 2/9  Decreased Interest 1 2 1   Down, Depressed, Hopeless 1 1 1   PHQ - 2 Score 2 3 2   Altered sleeping 2 1 2   Tired, decreased energy 1 2 2   Change in appetite 0 0 0  Feeling bad or failure about yourself  1 1 1   Trouble concentrating 0 0 0  Moving slowly or fidgety/restless 0 0 0  Suicidal thoughts 0 0 0  PHQ-9 Score 6 7 7   Difficult doing work/chores Somewhat difficult Somewhat difficult Somewhat difficult       07/24/2022    4:59 PM 06/20/2022    7:58 AM 04/20/2022   10:15 AM 03/28/2022    2:00 PM  GAD 7 : Generalized Anxiety Score  Nervous, Anxious, on Edge 1 1 1 1   Control/stop worrying 0 0 0 1  Worry too much - different things 0 0 0 1  Trouble relaxing 0 0 1 1  Restless 0 0 0 0  Easily annoyed or irritable 2 2 2 2   Afraid - awful might happen 1 0 0 0  Total GAD 7 Score 4 3 4 6   Anxiety Difficulty Somewhat difficult Somewhat difficult Somewhat difficult Somewhat difficult      Collaboration of Care: None at this time  Patient/Guardian was advised Release of Information must be obtained prior to any record release in order to collaborate their care with an outside provider. Patient/Guardian was advised if they have not already done so to contact the registration department to sign all necessary forms in order for [place] to release information regarding their care.   Consent: Patient/Guardian gives verbal consent for treatment and assignment of benefits for services provided during this visit. Patient/Guardian expressed  understanding and agreed to proceed.   Geoffry Paradise, LCSW 08/02/2022

## 2022-08-03 ENCOUNTER — Ambulatory Visit: Payer: BC Managed Care – PPO | Admitting: Physical Therapy

## 2022-08-07 ENCOUNTER — Ambulatory Visit (INDEPENDENT_AMBULATORY_CARE_PROVIDER_SITE_OTHER): Payer: BC Managed Care – PPO | Admitting: Licensed Clinical Social Worker

## 2022-08-07 DIAGNOSIS — F3341 Major depressive disorder, recurrent, in partial remission: Secondary | ICD-10-CM | POA: Diagnosis not present

## 2022-08-07 DIAGNOSIS — F4323 Adjustment disorder with mixed anxiety and depressed mood: Secondary | ICD-10-CM | POA: Diagnosis not present

## 2022-08-08 NOTE — Progress Notes (Signed)
THERAPIST PROGRESS NOTE  Session Time: 4:02PM-4:55PM  Participation Level: Active  Behavioral Response: CasualDrowsyDepressed and Irritable  Type of Therapy: Individual Therapy  Treatment Goals addressed:  Reduce overall frequency, intensity and duration of depression so that daily functioning is not impaired per pt self report 3 out of 5 sessions documented.   Recognize, accept and cope with feelings of depression per pt self report 3 out of 5 sessions.    ProgressTowards Goals: Progressing  Interventions: CBT, DBT, Motivational Interviewing, Strength-based, and Other: ACT  Summary: Debra Bender is a 37 y.o. female who presents with mixed sxs of anxiety and depression. Sxs endorsed including but not limited to irritability, worry, difficulty controlling worry, depressed mood, lack of motivation, and lack of interest. Pt oriented to person, place, and time. Pt denies SI/HI or A/V hallucinations. Pt was cooperative during visit and was engaged throughout the visit. Pt does not report any other concerns at the time of visit.  Pt utilized therapeutic space to process marital concerns. Pt shared that she feels she and her husband are only together because of the kids and identified ways in which they both have changed since first meeting. Pt explored how they first met and reported that their relationship changed after having kids. Pt reported loving being a parent and also reported difficulty in being a parent alone. Pt shared that she and her husband do not engage in relationship maintenance activities. Pt reported that she does not communicate her concerns to her husband. Used MI to discuss pt's actions and words activating in dissonance and invited pt to consider communication with husband. Invited pt to work with husband to find a standing time of 2hrs a week where pt and spouse can invest in their relationship and communicate.   Invited pt to go on a first date with husband.    LCSW provided mood monitoring and treatment progress review in the context of this episode of treatment. LCSW reviewed the pt's mood status since last session.   Pt is continuing to apply interventions/techniques learned in session into daily life situations. Pt is currently on track to meet goals utilizing interventions that are discussed in session. Treatment to continue as indicated. Personal growth and progress toward goals noted above.  Continued Recommendations as followed: Self-care behaviors, positive social engagements, focusing on positive physical and emotional wellness, and focusing on life/work balance.    Suicidal/Homicidal: Nowithout intent/plan  Therapist Response:  Provided pt education re: acceptance. Discussed how to make acceptance accessible at all parts of pt's healing journey.   Approached pt with strengths based perspective to assist pt in exploring strengths in moments of feeling low.   LCSW practiced active listening to validate pt participation, build rapport, and create safe space for pt to feel heard as they are disclosing their thoughts and feelings.   LCSW utilized therapeutic conversation skills informed by CBT, DBT, and ACT to expose pt to multiple ways of thinking about healing and to provide pt to access to multiple interventions.  Used MI to assess pt readiness for change.   Plan: Return again in 5 weeks.  Diagnosis: Adjustment disorder with mixed anxiety and depressed mood  Depression, major, recurrent, in partial remission (HCC)    08/07/2022    4:59 PM 07/24/2022    4:59 PM 06/20/2022    7:58 AM 04/20/2022   10:15 AM  GAD 7 : Generalized Anxiety Score  Nervous, Anxious, on Edge 1 1 1 1   Control/stop worrying 0 0 0 0  Worry too much - different things 0 0 0 0  Trouble relaxing 0 0 0 1  Restless 0 0 0 0  Easily annoyed or irritable 3 2 2 2   Afraid - awful might happen 0 1 0 0  Total GAD 7 Score 4 4 3 4   Anxiety Difficulty Somewhat difficult  Somewhat difficult Somewhat difficult Somewhat difficult       08/07/2022    4:58 PM 07/24/2022    4:58 PM 06/20/2022    7:57 AM  Depression screen PHQ 2/9  Decreased Interest 1 1 2   Down, Depressed, Hopeless 0 1 1  PHQ - 2 Score 1 2 3   Altered sleeping 1 2 1   Tired, decreased energy 2 1 2   Change in appetite 0 0 0  Feeling bad or failure about yourself  1 1 1   Trouble concentrating 0 0 0  Moving slowly or fidgety/restless 0 0 0  Suicidal thoughts 0 0 0  PHQ-9 Score 5 6 7   Difficult doing work/chores Somewhat difficult Somewhat difficult Somewhat difficult    Collaboration of Care: Other N/A  Patient/Guardian was advised Release of Information must be obtained prior to any record release in order to collaborate their care with an outside provider. Patient/Guardian was advised if they have not already done so to contact the registration department to sign all necessary forms in order for [place] to release information regarding their care.   Consent: Patient/Guardian gives verbal consent for treatment and assignment of benefits for services provided during this visit. Patient/Guardian expressed understanding and agreed to proceed.   Geoffry Paradise, LCSW 08/08/2022

## 2022-08-10 ENCOUNTER — Ambulatory Visit: Payer: BC Managed Care – PPO | Admitting: Physical Therapy

## 2022-08-17 ENCOUNTER — Ambulatory Visit: Payer: BC Managed Care – PPO | Attending: Family Medicine | Admitting: Physical Therapy

## 2022-08-17 DIAGNOSIS — M6281 Muscle weakness (generalized): Secondary | ICD-10-CM | POA: Diagnosis present

## 2022-08-17 DIAGNOSIS — R278 Other lack of coordination: Secondary | ICD-10-CM | POA: Diagnosis present

## 2022-08-17 NOTE — Patient Instructions (Addendum)
Stretch for pelvic floor     On belly: Riding horse edge of mattress  knee bent like riding a horse, move knee towards armpit and out  10 reps   Mermaid stretch  Rocking while seated on the floor with heels to one side of the hip Heels to one side of the hip  Rock forward towards the knee that is bent , rock beck towards the opposite sitting bones    3- foot tap  10 reps  Each side   Hold onto wall    __  Sitting on stool when on the floor with kids   Slightly bend of standing knee, and keep hips above foot   ballmound of opposite leg   taps to each direction and   back to spot under hips- notice equal pressure through both legs, and across ballmound and heels    ___  Standing with more ballmound and heel weights, less locked knees

## 2022-08-17 NOTE — Therapy (Signed)
OUTPATIENT PHYSICAL THERAPY FEMALE PELVIC TREATMENT   Patient Name: Debra Bender MRN: [id number] DOB:February 20, 1986, 37 y.o., female Today's Date: 08/17/2022   PT End of Session - 08/17/22 1552     Visit Number 7    Number of Visits 16    Date for PT Re-Evaluation 10/05/22    Authorization Type IE 02/07/2022    PT Start Time 1547    PT Stop Time 1630    PT Time Calculation (min) 43 min    Activity Tolerance Patient tolerated treatment well    Behavior During Therapy [facility] for tasks assessed/performed             Past Medical History:  Diagnosis Date   Anemia 12/2014   Depression    GERD (gastroesophageal reflux disease)    Hypertension    Hypothyroidism    during pregnancy   Obesity affecting pregnancy    PTSD (post-traumatic stress disorder)    Past Surgical History:  Procedure Laterality Date   CESAREAN SECTION N/A 11/20/2018   Procedure: CESAREAN SECTION;  Surgeon: Nadara Mustard, MD;  Location: [facility] OR[facility];  Service: Obstetrics;  Laterality: N/A;  [id number] JYN:WGNF APGAR8,9 WT [id number]   FRACTURE SURGERY Right 37 years old   MINOR EXCISION OF ORAL LESION Left 07/13/2022   Procedure: EXCISION LESION ORAL VESTIBULE;  Surgeon: Vernie Murders, MD;  Location: [facility];  Service: ENT;  Laterality: Left;   TONSILLECTOMY Bilateral 01/15/2020   Procedure: TONSILLECTOMY;  Surgeon: Vernie Murders, MD;  Location: [facility];  Service: ENT;  Laterality: Bilateral;  Latex   Patient Active Problem List   Diagnosis Date Noted   Postpartum care following cesarean delivery 11/23/2018   Encounter for planned induction of labor 11/20/2018   Chronic hypertension affecting pregnancy 07/04/2018   BMI 45.0-49.9, adult (HCC) 04/04/2018   Obesity affecting pregnancy, antepartum 04/04/2018   Family history of congenital heart disease    Supervision of high risk pregnancy, antepartum 10/12/2016   Gastroesophageal reflux disease without esophagitis 04/17/2016   Essential  hypertension 02/16/2016   History of thyroid disorder 11/01/2015   Hypothyroidism 02/08/2015    PCP: Rolm Gala, MD  REFERRING PROVIDER: Ardyth Man, PA-C  REFERRING DIAG: Stress incontinence of urine  THERAPY DIAG:  Other lack of coordination  Muscle weakness (generalized)  Rationale for Evaluation and Treatment Rehabilitation  PRECAUTIONS: None  WEIGHT BEARING RESTRICTIONS No  FALLS:  Has patient fallen in last 6 months? No  ONSET DATE: adolescence  SUBJECTIVE:           Pt reports LBP has improved by 30-40%.  CHIEF COMPLAINT: Patient reports that leakage is very disruptive. Patient notes her UI has gotten so bad that at her child's birthday party when coughing she had leakage that ran down her leg. Patient is an every day vaper.    PERTINENT HISTORY/CHART REVIEW:  Red flags (bowel/bladder changes, saddle paresthesia, personal history of cancer, h/o spinal tumors, h/o compression fx, h/o abdominal aneurysm, abdominal pain, chills/fever, night sweats, nausea, vomiting, unrelenting pain, first onset of insidious LBP <20 y/o): Negative   PAIN:  Patient notes occasional pain in knees and ankles; denies pelvic or LBP.    OCCUPATION/LEISURE ACTIVITIES:  Caregiving; crafting; nature trails  PLOF:  Independent  PATIENT GOALS: Back to the amount of leakage that was present in adolescence.  [facility] PT Assessment - 08/17/22 1629       Observation/Other Assessments   Observations simulated sititng on floor with kids:  half bent knee , other knee straight , hyperextended knees      Posture/Postural Control   Posture Comments hyperextended knees, adducted knees, dropped of foot arches               Pelvic Floor Special Questions - 08/17/22 1610     Pelvic Floor Internal Exam through  clothing, tightness at atnerior pelvic floor, coccygeus tightness and tenderness B, limited nutation             [facility] Adult PT Treatment/Exercise - 08/17/22 1629       Therapeutic Activites    Other Therapeutic Activities cued for sitting on floor with stool to promote more pelvic stability      Neuro Re-ed    Neuro Re-ed Details  cued for  standing posture with less locked knees, pelvic floor streches      Modalities   Modalities Moist Heat      Moist Heat Therapy   Moist Heat Location --   in prone unbilled     Manual Therapy   Manual therapy comments PA/mob Grade III at sacrum,  STM/MWM to promote nutation of sacrum              Clinical Impression:  Pelvic girdle and shoulder showed good carry over from past session three  weeks ago. LBP has improved by 30-40% since last session. Manual Tx helped to promote nutation of sacrum today which will help with bowel movements.  Cued for  standing posture with less locked knees, pelvic floor stretches.Cued for sitting on floor with stool to promote more pelvic stability  Patient will benefit from continued skilled therapeutic intervention to address remaining deficits in PFM coordination, PFM strength, and IAP management in order to increase function and improve overall QOL.   Objective impairments: decreased activity tolerance, decreased coordination, decreased endurance, decreased strength, and improper body mechanics.   Activity limitations: cleaning, laundry, interpersonal relationship, community activity, occupation, and yard work.   Personal factors: Behavior pattern, Fitness, Past/current experiences, Time since onset of injury/illness/exacerbation, and 3+ comorbidities: Depression, hypothyroidism, anxiety, HTN, GERD  are also affecting patient's functional outcome.   Rehab Potential: Good  Clinical decision making: Evolving/moderate complexity  Evaluation complexity: Moderate   GOALS: Goals reviewed with  patient? Yes  SHORT TERM GOALS: Target date: 03/21/2022  Patient will demonstrate independence with HEP in order to maximize therapeutic gains and improve carryover from physical therapy sessions to ADLs in the home and community. Baseline: not initiated; 2/6: IND Goal status: MET    LONG TERM GOALS: Target date: 10/06/2022    Patient will demonstrate improved function as  evidenced by a score of > 5 pts  on FOTO measure for full participation in activities at home and in the community.  Baseline: 41; 2/6: 59 ,  07/27/22 :  52  Goal status: MET   2. Patient will demonstrate improved function as evidenced by a score of > 5 pts  on PFDI Urinary measure for full participation in activities at home and in the community.  Baseline: 58; 2/6: 13  07/27/22 :  25 Goal status:MET   3.  Patient will demonstrate circumferential and sequential contraction of >3/5 MMT, > 5 sec hold x5 and 5 consecutive quick flicks with </= 10 min rest between testing bouts, and relaxation of the PFM coordinated with breath for improved management of intra-abdominal pressure and normal bowel and bladder function without the presence of pain nor incontinence in order to improve participation at home and in the community.  Baseline: not formally assessed; 2/6: 3/5 MMT, x3 quick flicks (adequate relaxation) Goal status: IN PROGRESS  4. Pt will demo decreased C section r / perineal restrictions in order to progress to deep core exercises for co-activation of pelvic floor and TrA  Baseline:  increased scar restrictions  Goal status: NEW   5.  Pt will report being able to start and complete bowel movements  and straining < 50% of the time  Baseline:  not able to   start and complete bowel movements  Goal status: NEW    6. Pt will demo levelled pelvic girdle and shoulder height in order to progress to deep core strengthening HEP and restore mobility at spine, pelvis, gait, posture   Baseline:L shoulder lowered, R iliac  crest lowered, L lumbar convex curve Goal Status: MET     PLAN: Rehab frequency: 1x/week  Rehab duration: 10 weeks  Planned interventions: Therapeutic exercises, Therapeutic activity, Neuromuscular re-education, Balance training, Gait training, Patient/Family education, Self Care, Joint mobilization, Orthotic/Fit training, Electrical stimulation, Spinal mobilization, Cryotherapy, Moist heat, scar mobilization, Taping, and Manual therapy   Mariane Masters, DPT, PT  07/27/22   4:28 PM

## 2022-08-24 ENCOUNTER — Ambulatory Visit: Payer: BC Managed Care – PPO | Admitting: Physical Therapy

## 2022-08-24 DIAGNOSIS — R278 Other lack of coordination: Secondary | ICD-10-CM | POA: Diagnosis not present

## 2022-08-24 DIAGNOSIS — M6281 Muscle weakness (generalized): Secondary | ICD-10-CM

## 2022-08-24 NOTE — Therapy (Signed)
OUTPATIENT PHYSICAL THERAPY FEMALE PELVIC TREATMENT   Patient Name: Debra Bender MRN: [id number] DOB:1985-11-11, 37 y.o., female Today's Date: 08/24/2022   PT End of Session - 08/24/22 1603     Visit Number 8    Number of Visits 16    Date for PT Re-Evaluation 10/05/22    Authorization Type IE 02/07/2022    PT Start Time 1557    PT Stop Time 1630    PT Time Calculation (min) 33 min    Activity Tolerance Patient tolerated treatment well    Behavior During Therapy [facility] for tasks assessed/performed             Past Medical History:  Diagnosis Date   Anemia 12/2014   Depression    GERD (gastroesophageal reflux disease)    Hypertension    Hypothyroidism    during pregnancy   Obesity affecting pregnancy    PTSD (post-traumatic stress disorder)    Past Surgical History:  Procedure Laterality Date   CESAREAN SECTION N/A 11/20/2018   Procedure: CESAREAN SECTION;  Surgeon: Nadara Mustard, MD;  Location: [facility];  Service: Obstetrics;  Laterality: N/A;  [id number] JYN:WGNF APGAR8,9 WT [id number]   FRACTURE SURGERY Right 37 years old   MINOR EXCISION OF ORAL LESION Left 07/13/2022   Procedure: EXCISION LESION ORAL VESTIBULE;  Surgeon: Vernie Murders, MD;  Location: [facility];  Service: ENT;  Laterality: Left;   TONSILLECTOMY Bilateral 01/15/2020   Procedure: TONSILLECTOMY;  Surgeon: Vernie Murders, MD;  Location: [facility];  Service: ENT;  Laterality: Bilateral;  Latex   Patient Active Problem List   Diagnosis Date Noted   Postpartum care following cesarean delivery 11/23/2018   Encounter for planned induction of labor 11/20/2018   Chronic hypertension affecting pregnancy 07/04/2018   BMI 45.0-49.9, adult (HCC) 04/04/2018   Obesity affecting pregnancy, antepartum 04/04/2018   Family history of congenital heart disease    Supervision of high risk pregnancy, antepartum 10/12/2016   Gastroesophageal reflux disease without esophagitis 04/17/2016   Essential  hypertension 02/16/2016   History of thyroid disorder 11/01/2015   Hypothyroidism 02/08/2015    PCP: Rolm Gala, MD  REFERRING PROVIDER: Ardyth Man, PA-C  REFERRING DIAG: Stress incontinence of urine  THERAPY DIAG:  Other lack of coordination  Muscle weakness (generalized)  Rationale for Evaluation and Treatment Rehabilitation  PRECAUTIONS: None  WEIGHT BEARING RESTRICTIONS No  FALLS:  Has patient fallen in last 6 months? No  ONSET DATE: adolescence  SUBJECTIVE:           Pt reports LBP has improved from 30-40%. To 80% better this week. But B knees and feet are bothering her. L knee hurts more which is the one she hyperextended.  Pt is able to have bowel movements without sitting for a long time. Stool elimination is better without straining  CHIEF COMPLAINT: Patient reports that leakage is very disruptive. Patient notes her UI has gotten so bad that at her child's birthday party when coughing she had leakage that ran down her leg. Patient is an every day vaper.    PERTINENT HISTORY/CHART REVIEW:  Red flags (bowel/bladder changes, saddle paresthesia, personal history of cancer, h/o spinal tumors, h/o compression fx, h/o abdominal aneurysm, abdominal pain, chills/fever, night sweats, nausea, vomiting, unrelenting pain, first onset of insidious LBP <22 y/o): Negative   PAIN:  Patient notes occasional pain in knees and ankles; denies pelvic or LBP.    OCCUPATION/LEISURE ACTIVITIES:  Caregiving; crafting; nature trails  PLOF:  Independent  PATIENT GOALS: Back to the amount of leakage that was present in adolescence.    [facility] PT Assessment - 08/24/22 1607       Observation/Other Assessments   Observations medial collapse of inner arches , hyperextended knees      Strength    Overall Strength Comments 15 reps L, 17 reps R  MMT 4/5 with single UE support, ,    27 sec R SLS, 17 sec on LLE,  MMT at toe ext, big toe ext, DF/EV B 5/5      Palpation   Palpation comment tenderness at lateral patella ( post Tx, no pain) ,  t hypomobility at midfoot joints, tib fib  L             [facility] Adult PT Treatment/Exercise - 08/24/22 0001       Neuro Re-ed    Neuro Re-ed Details  cued for arch strenghtening HEP      Manual Therapy   Manual therapy comments AP/PA mob at midfoot joints , tib fib superior joint L    Kinesiotex Facilitate Muscle      Kinesiotix   Facilitate Muscle  lift arch L , ER patella               Clinical Impression:  Improvements include:   Pt reports LBP has improved from 30-40%. To 80% better this week. Pt is able to have bowel movements without sitting for a long time. Stool elimination is better without straining   Focused on LKC because pt  B knees and feet are bothering her. Noted hypomobile midfoot joints L and collapsed arches B. Manual Tx helped to address these issues. Pt reported decreased knee and ankle pain from 6/10 to 3/10 post Tx. Cued for arch strengthening in technique and alignment.  Plan to continue with more LKC proprioception and strengthening.   Patient will benefit from continued skilled therapeutic intervention to address remaining deficits in PFM coordination, PFM strength, and IAP management in order to increase function and improve overall QOL.   Objective impairments: decreased activity tolerance, decreased coordination, decreased endurance, decreased strength, and improper body mechanics.   Activity limitations: cleaning, laundry, interpersonal relationship, community activity, occupation, and yard work.   Personal factors: Behavior pattern, Fitness, Past/current experiences, Time since onset of injury/illness/exacerbation, and 3+ comorbidities: Depression, hypothyroidism, anxiety, HTN, GERD  are also  affecting patient's functional outcome.   Rehab Potential: Good  Clinical decision making: Evolving/moderate complexity  Evaluation complexity: Moderate   GOALS: Goals reviewed with patient? Yes  SHORT TERM GOALS: Target date: 03/21/2022  Patient will demonstrate independence with HEP in order to maximize therapeutic gains and improve carryover from physical therapy sessions to ADLs in the home and community. Baseline: not initiated; 2/6: IND Goal status: MET    LONG TERM GOALS: Target date: 10/06/2022  Patient will demonstrate improved function as evidenced by a score of > 5 pts  on FOTO measure for full participation in activities at home and in the community.  Baseline: 41; 2/6: 59 ,  07/27/22 :  52  Goal status: MET   2. Patient will demonstrate improved function as evidenced by a score of > 5 pts  on PFDI Urinary measure for full participation in activities at home and in the community.  Baseline: 58; 2/6: 13  07/27/22 :  25 Goal status:MET   3.  Patient will demonstrate circumferential and sequential contraction of >3/5 MMT, > 5 sec hold x5 and 5 consecutive quick flicks with </= 10 min rest between testing bouts, and relaxation of the PFM coordinated with breath for improved management of intra-abdominal pressure and normal bowel and bladder function without the presence of pain nor incontinence in order to improve participation at home and in the community.  Baseline: not formally assessed; 2/6: 3/5 MMT, x3 quick flicks (adequate relaxation) Goal status: IN PROGRESS  4. Pt will demo decreased C section r / perineal restrictions in order to progress to deep core exercises for co-activation of pelvic floor and TrA  Baseline:  increased scar restrictions  Goal status: NEW   5.  Pt will report being able to start and complete bowel movements  and straining < 50% of the time  Baseline:  not able to   start and complete bowel movements  Goal status: NEW    6. Pt will demo  levelled pelvic girdle and shoulder height in order to progress to deep core strengthening HEP and restore mobility at spine, pelvis, gait, posture   Baseline:L shoulder lowered, R iliac crest lowered, L lumbar convex curve Goal Status: MET     PLAN: Rehab frequency: 1x/week  Rehab duration: 10 weeks  Planned interventions: Therapeutic exercises, Therapeutic activity, Neuromuscular re-education, Balance training, Gait training, Patient/Family education, Self Care, Joint mobilization, Orthotic/Fit training, Electrical stimulation, Spinal mobilization, Cryotherapy, Moist heat, scar mobilization, Taping, and Manual therapy   Mariane Masters, DPT, PT  07/27/22   4:28 PM

## 2022-08-24 NOTE — Patient Instructions (Signed)
Strengthening feet arches:    Heel raises - heels together, minisquat  Minisquat motion, trunk bent , gaze onto floor like you are looking at your reflection over a lake/pond,  Knees bent pointed out like a "v" , navel ( center of mass) more forward  Heels together as you lift, pointed out like a "v"  KNEES ARE ALIGNED BEHIND THE TOES TO MINIMIZE STRAIN ON THE KNEES your  navel ( center of mass) more forward to a avoid dropping down fast and rocking more weight back onto heels , keep heels pressing against each other the whole time   30 reps  ________________

## 2022-08-31 ENCOUNTER — Ambulatory Visit: Payer: BC Managed Care – PPO | Admitting: Physical Therapy

## 2022-08-31 DIAGNOSIS — R278 Other lack of coordination: Secondary | ICD-10-CM | POA: Diagnosis not present

## 2022-08-31 DIAGNOSIS — M6281 Muscle weakness (generalized): Secondary | ICD-10-CM

## 2022-08-31 NOTE — Therapy (Signed)
OUTPATIENT PHYSICAL THERAPY FEMALE PELVIC TREATMENT   Patient Name: Debra Bender MRN: [id number] DOB:Nov 08, 1985, 37 y.o., female Today's Date: 08/31/2022   PT End of Session - 08/31/22 1605     Visit Number 9    Number of Visits 16    Date for PT Re-Evaluation 10/05/22    Authorization Type IE 02/07/2022    PT Start Time 1600    PT Stop Time 1630    PT Time Calculation (min) 30 min    Activity Tolerance Patient tolerated treatment well    Behavior During Therapy [facility] for tasks assessed/performed             Past Medical History:  Diagnosis Date   Anemia 12/2014   Depression    GERD (gastroesophageal reflux disease)    Hypertension    Hypothyroidism    during pregnancy   Obesity affecting pregnancy    PTSD (post-traumatic stress disorder)    Past Surgical History:  Procedure Laterality Date   CESAREAN SECTION N/A 11/20/2018   Procedure: CESAREAN SECTION;  Surgeon: Nadara Mustard, MD;  Location: [facility] ORS;  Service: Obstetrics;  Laterality: N/A;  [id number] JYN:WGNF APGAR8,9 WT [id number]   FRACTURE SURGERY Right 37 years old   MINOR EXCISION OF ORAL LESION Left 07/13/2022   Procedure: EXCISION LESION ORAL VESTIBULE;  Surgeon: Vernie Murders, MD;  Location: [facility];  Service: ENT;  Laterality: Left;   TONSILLECTOMY Bilateral 01/15/2020   Procedure: TONSILLECTOMY;  Surgeon: Vernie Murders, MD;  Location: [facility];  Service: ENT;  Laterality: Bilateral;  Latex   Patient Active Problem List   Diagnosis Date Noted   Postpartum care following cesarean delivery 11/23/2018   Encounter for planned induction of labor 11/20/2018   Chronic hypertension affecting pregnancy 07/04/2018   BMI 45.0-49.9, adult (HCC) 04/04/2018   Obesity affecting pregnancy, antepartum 04/04/2018   Family history of congenital heart disease    Supervision of high risk pregnancy, antepartum 10/12/2016   Gastroesophageal reflux disease without esophagitis 04/17/2016   Essential  hypertension 02/16/2016   History of thyroid disorder 11/01/2015   Hypothyroidism 02/08/2015    PCP: Rolm Gala, MD  REFERRING PROVIDER: Ardyth Man, PA-C  REFERRING DIAG: Stress incontinence of urine  THERAPY DIAG:  Other lack of coordination  Muscle weakness (generalized)  Rationale for Evaluation and Treatment Rehabilitation  PRECAUTIONS: None  WEIGHT BEARING RESTRICTIONS No  FALLS:  Has patient fallen in last 6 months? No  ONSET DATE: adolescence  SUBJECTIVE:           Pt reports knees are better from 6-7/10 to 3/10 . The  K -tape helped the knees.  CHIEF COMPLAINT: Patient reports that leakage is very disruptive. Patient notes her UI has gotten so bad that at her child's birthday party when coughing she had leakage that ran down her leg. Patient is an every day vaper.    PERTINENT HISTORY/CHART REVIEW:  Red flags (bowel/bladder changes, saddle paresthesia, personal history of cancer, h/o spinal tumors, h/o compression fx, h/o abdominal aneurysm, abdominal pain, chills/fever, night sweats, nausea, vomiting, unrelenting pain, first onset of insidious LBP <20 y/o): Negative   PAIN:  Patient notes occasional pain in knees and ankles; denies pelvic or LBP.    OCCUPATION/LEISURE ACTIVITIES:  Caregiving; crafting; nature trails  PLOF:  Independent  PATIENT GOALS: Back to the amount of leakage that was present in adolescence.     [facility] PT Assessment -        Palpation   Palpation comment restricted scars over C section             [facility] Adult PT Treatment/Exercise -        Neuro Re-ed    Neuro Re-ed Details  cued for self -scar release over abdominal scars      Modalities   Modalities Moist Heat      Moist Heat Therapy   Number Minutes Moist Heat 5 Minutes    Moist  Heat Location --   over low abdomen     Manual Therapy   Manual therapy comments scar mobilization with MWM to minimzie restrictions over C section scars and optimize deep core IAP system               Clinical Impression:  Improvements include:   Pt reports knees are better from 6-7/10 to 3/10 . The  K -tape helped the knees.    Cued for arch strengthening in technique and alignment.     Pt arrived late and thus session was abbrievated.  Focused on minimizing C section scar restrictions which will help with  optimize deep core IAP system to help with continence.  PT showed IND with self mobilization of scar for HEP.    Plan to continue with more LKC proprioception and strengthening and assess pelvic floor at upcoming.   Patient will benefit from continued skilled therapeutic intervention to address remaining deficits in PFM coordination, PFM strength, and IAP management in order to increase function and improve overall QOL.   Objective impairments: decreased activity tolerance, decreased coordination, decreased endurance, decreased strength, and improper body mechanics.   Activity limitations: cleaning, laundry, interpersonal relationship, community activity, occupation, and yard work.   Personal factors: Behavior pattern, Fitness, Past/current experiences, Time since onset of injury/illness/exacerbation, and 3+ comorbidities: Depression, hypothyroidism, anxiety, HTN, GERD  are also affecting patient's functional outcome.   Rehab Potential: Good  Clinical decision making: Evolving/moderate complexity  Evaluation complexity: Moderate   GOALS: Goals reviewed with patient? Yes  SHORT TERM GOALS: Target date: 03/21/2022  Patient will demonstrate independence with HEP in order to maximize therapeutic gains and improve carryover from physical therapy sessions to ADLs in the home and community. Baseline: not initiated; 2/6: IND Goal status: MET    LONG TERM GOALS:  Target date: 10/06/2022    Patient will demonstrate improved function as evidenced by a score of > 5 pts  on FOTO measure for full participation in activities at home and in the community.  Baseline: 41; 2/6: 59 ,  07/27/22 :  52  Goal status: MET   2. Patient will demonstrate improved function as evidenced by a score of >  5 pts  on PFDI Urinary measure for full participation in activities at home and in the community.  Baseline: 58; 2/6: 13  07/27/22 :  25 Goal status:MET   3.  Patient will demonstrate circumferential and sequential contraction of >3/5 MMT, > 5 sec hold x5 and 5 consecutive quick flicks with </= 10 min rest between testing bouts, and relaxation of the PFM coordinated with breath for improved management of intra-abdominal pressure and normal bowel and bladder function without the presence of pain nor incontinence in order to improve participation at home and in the community.  Baseline: not formally assessed; 2/6: 3/5 MMT, x3 quick flicks (adequate relaxation) Goal status: IN PROGRESS  4. Pt will demo decreased C section r / perineal restrictions in order to progress to deep core exercises for co-activation of pelvic floor and TrA  Baseline:  increased scar restrictions  Goal status: NEW   5.  Pt will report being able to start and complete bowel movements  and straining < 50% of the time  Baseline:  not able to   start and complete bowel movements  Goal status: MET    6. Pt will demo levelled pelvic girdle and shoulder height in order to progress to deep core strengthening HEP and restore mobility at spine, pelvis, gait, posture   Baseline:L shoulder lowered, R iliac crest lowered, L lumbar convex curve Goal Status: MET     PLAN: Rehab frequency: 1x/week  Rehab duration: 10 weeks  Planned interventions: Therapeutic exercises, Therapeutic activity, Neuromuscular re-education, Balance training, Gait training, Patient/Family education, Self Care, Joint mobilization,  Orthotic/Fit training, Electrical stimulation, Spinal mobilization, Cryotherapy, Moist heat, scar mobilization, Taping, and Manual therapy   Mariane Masters, DPT, PT  07/27/22   4:28 PM

## 2022-09-04 ENCOUNTER — Ambulatory Visit: Payer: BC Managed Care – PPO | Admitting: Licensed Clinical Social Worker

## 2022-09-11 ENCOUNTER — Ambulatory Visit (INDEPENDENT_AMBULATORY_CARE_PROVIDER_SITE_OTHER): Payer: BC Managed Care – PPO | Admitting: Licensed Clinical Social Worker

## 2022-09-11 DIAGNOSIS — F3341 Major depressive disorder, recurrent, in partial remission: Secondary | ICD-10-CM

## 2022-09-13 ENCOUNTER — Encounter: Payer: Self-pay | Admitting: Psychiatry

## 2022-09-13 ENCOUNTER — Ambulatory Visit (INDEPENDENT_AMBULATORY_CARE_PROVIDER_SITE_OTHER): Payer: BC Managed Care – PPO | Admitting: Psychiatry

## 2022-09-13 VITALS — BP 128/91 | HR 83 | Temp 97.6°F | Ht 64.5 in | Wt 274.4 lb

## 2022-09-13 DIAGNOSIS — F331 Major depressive disorder, recurrent, moderate: Secondary | ICD-10-CM

## 2022-09-13 DIAGNOSIS — F419 Anxiety disorder, unspecified: Secondary | ICD-10-CM | POA: Diagnosis not present

## 2022-09-13 MED ORDER — MIRTAZAPINE 7.5 MG PO TABS
7.5000 mg | ORAL_TABLET | Freq: Every day | ORAL | 1 refills | Status: DC
Start: 2022-09-13 — End: 2022-10-10
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
## 2022-09-13 NOTE — Patient Instructions (Signed)
Mirtazapine Tablets What is this medication? MIRTAZAPINE (mir TAZ a peen) treats depression. It increases the amount of serotonin and norepinephrine in the brain, hormones that help regulate mood. This medicine may be used for other purposes; ask your health care provider or pharmacist if you have questions. COMMON BRAND NAME(S): Remeron What should I tell my care team before I take this medication? They need to know if you have any of these conditions: Bipolar disorder Glaucoma Kidney disease Liver disease Suicidal thoughts An unusual or allergic reaction to mirtazapine, other medications, foods, dyes, or preservatives Pregnant or trying to get pregnant Breast-feeding How should I use this medication? Take this medication by mouth with a glass of water. Follow the directions on the prescription label. Take your medication at regular intervals. Do not take your medication more often than directed. Do not stop taking this medication suddenly except upon the advice of your care team. Stopping this medication too quickly may cause serious side effects or your condition may worsen. A special MedGuide will be given to you by the pharmacist with each prescription and refill. Be sure to read this information carefully each time. Talk to your care team about the use of this medication in children. Special care may be needed. Overdosage: If you think you have taken too much of this medicine contact a poison control center or emergency room at once. NOTE: This medicine is only for you. Do not share this medicine with others. What if I miss a dose? If you miss a dose, take it as soon as you can. If it is almost time for your next dose, take only that dose. Do not take double or extra doses. What may interact with this medication? Do not take this medication with any of the following: Linezolid MAOIs, such as Carbex, Eldepryl, Marplan, Nardil, and Parnate Methylene blue (injected into a vein) This  medication may also interact with the following: Alcohol Antivirals for HIV or AIDS Certain medications that treat or prevent blood clots, such as warfarin Certain medications for fungal infections, such as ketoconazole and itraconazole Certain medications for mental health conditions Certain medications for migraine headache, such as almotriptan, eletriptan, frovatriptan, naratriptan, rizatriptan, sumatriptan, zolmitriptan Certain medications for seizures, such as carbamazepine or phenytoin Certain medications for sleep Cimetidine Erythromycin Fentanyl Lithium Medications for blood pressure Nefazodone Rasagiline Rifampin Supplements, such as St. John's wort, kava kava, valerian Tramadol Tryptophan This list may not describe all possible interactions. Give your health care provider a list of all the medicines, herbs, non-prescription drugs, or dietary supplements you use. Also tell them if you smoke, drink alcohol, or use illegal drugs. Some items may interact with your medicine. What should I watch for while using this medication? Visit your care team for regular checks on your progress. It may be some time before you see the benefit from this medication. This medication may cause thoughts of suicide or depression. This includes sudden changes in mood, behaviors, or thoughts. These changes can happen at any time but are more common in the beginning of treatment or after a change in dose. Call your care team right away if you experience these thoughts or worsening depression. This medication may affect your coordination, reaction time, or judgment. Do not drive or operate machinery until you know how this medication affects you. Sit up or stand slowly to reduce the risk of dizzy or fainting spells. Drinking alcohol with this medication can increase the risk of these side effects. This medication may cause dry   eyes and blurred vision. If you wear contact lenses, you may feel some discomfort.  Lubricating eye drops may help. See your care team if the problem does not go away or is severe. Your mouth may get dry. Chewing sugarless gum or sucking hard candy and drinking plenty of water may help. Contact your care team if the problem does not go away or is severe. What side effects may I notice from receiving this medication? Side effects that you should report to your care team as soon as possible: Allergic reactions--skin rash, itching, hives, swelling of the face, lips, tongue, or throat Heart rhythm changes--fast or irregular heartbeat, dizziness, feeling faint or lightheaded, chest pain, trouble breathing Infection--fever, chills, cough, or sore throat Irritability, confusion, fast or irregular heartbeat, muscle stiffness, twitching muscles, sweating, high fever, seizure, chills, vomiting, diarrhea, which may be signs of serotonin syndrome Low sodium level--muscle weakness, fatigue, dizziness, headache, confusion Rash, fever, and swollen lymph nodes Redness, blistering, peeling or loosening of the skin, including inside the mouth Seizures Sudden eye pain or change in vision such as blurry vision, seeing halos around lights, vision loss Thoughts of suicide or self-harm, worsening mood, feelings of depression Side effects that usually do not require medical attention (report to your care team if they continue or are bothersome): Constipation Dizziness Drowsiness Dry mouth Increase in appetite Weight gain This list may not describe all possible side effects. Call your doctor for medical advice about side effects. You may report side effects to FDA at 1-800-FDA-1088. Where should I keep my medication? Keep out of the reach of children. Store at room temperature between 15 and 30 degrees C (59 and 86 degrees F) Protect from light and moisture. Throw away any unused medication after the expiration date. NOTE: This sheet is a summary. It may not cover all possible information. If you  have questions about this medicine, talk to your doctor, pharmacist, or health care provider.  2024 Elsevier/Gold Standard (2021-11-16 00:00:00)  

## 2022-09-18 ENCOUNTER — Ambulatory Visit (INDEPENDENT_AMBULATORY_CARE_PROVIDER_SITE_OTHER): Payer: BC Managed Care – PPO | Admitting: Licensed Clinical Social Worker

## 2022-09-18 DIAGNOSIS — F419 Anxiety disorder, unspecified: Secondary | ICD-10-CM | POA: Diagnosis not present

## 2022-09-18 DIAGNOSIS — F331 Major depressive disorder, recurrent, moderate: Secondary | ICD-10-CM

## 2022-09-19 ENCOUNTER — Encounter: Payer: BC Managed Care – PPO | Admitting: Physical Therapy

## 2022-09-19 NOTE — Progress Notes (Signed)
THERAPIST PROGRESS NOTE  Session Time: 4:07PM-4:55PM  Participation Level: Active  Behavioral Response: CasualAlertAnxious and Depressed  Type of Therapy: Individual Therapy  Treatment Goals addressed:  Recall traumatic events without becoming overwhelmed with negative emotions  Increase coping skills to manage depression and improve ability to perform daily activities  Reduce frequency, intensity, and duration of depression symptoms so that daily functioning is improved  ProgressTowards Goals: Progressing  Interventions: CBT, DBT, Motivational Interviewing, Strength-based, and Other: ACT  Summary: Debra Bender is a 37 y.o. female who presents with mixed sxs of anxiety and depression related to a hx of trauma and experiences with current stressors. Sxs endorsed including but not limited to nervousness, fatigue, irritability, negative self affect, anxious thoughts, and fearfulness. Pt oriented to person, place, and time. Pt denies SI/HI or A/V hallucinations. Pt was cooperative during visit and was engaged throughout the visit. Pt does not report any other concerns at the time of visit.  Pt utilized therapeutic space to process passing of previous student. Provided pt space to explore emotional response and to reflect on impacts of student's passing. Pt reported feeling uncertain of how to grieve due to lack of space to grieve in childhood. Provided psychoed re: grief and validated that there is no correct way to grieve. Taught pt good, bad, gratitude processing exercise to assist pt in viewing all sides of situation or circumstance being processed. Stressed importance of ending with gratitude as gratitude can serve as a form of closure during processing work.   Pt reported feeling inspired to get back into church upon passing of her student. Pt identified barriers to returning to church such as feeling judged by parent. Provided pt processing space. Pt engaged in inner child work  and explored ways that her adult self has power that her inner child did not possess. Pt engaged in discussion on values directed goal exploration. Pt identified values in therapy and barriers to aligning with values. Pt explored ways to overcome barriers to values driven goal pursuit. Provided pt validation of normalcy of values changing as self changes. Invited client to reflect on how values grow and shift with self growth and shifts. Educated pt on cognitive dissonance that occurs with engaging in behaviors that contradict values.   Encouraged pt to explore what it would mean to go to bed happy with self.   Pt is continuing to apply interventions/techniques learned in session into daily life situations. Pt is currently on track to meet goals utilizing interventions that are discussed in session. Treatment to continue as indicated. Personal growth and progress toward goals noted above.  LCSW provided mood monitoring and treatment progress review in the context of this episode of treatment. LCSW reviewed the pt's mood status since last session.   Continued Recommendations as followed: Self-care behaviors, positive social engagements, focusing on positive physical and emotional wellness, and focusing on life/work balance.    Suicidal/Homicidal: Nowithout intent/plan  Therapist Response:  Provided pt education re: acceptance. Discussed how to make acceptance accessible at all parts of pt's healing journey.   Approached pt with strengths based perspective to assist pt in exploring strengths in moments of feeling low.   LCSW practiced active listening to validate pt participation, build rapport, and create safe space for pt to feel heard as they are disclosing their thoughts and feelings.   LCSW utilized therapeutic conversation skills informed by CBT, DBT, and ACT to expose pt to multiple ways of thinking about healing and to provide pt to access  to multiple interventions.  LCSW introduced pt to  Acceptance and Commitment Therapy. Pt engaged in discussion on how to explore what they must accept in order to commit to what they have identified as important. Introduced pt to values directed goal exploration in order to identify goals of importance.    Plan: Return again in 3 weeks.  Diagnosis: MDD (major depressive disorder), recurrent episode, moderate (HCC)  Anxiety disorder, unspecified type    09/18/2022    4:58 PM 09/13/2022    3:10 PM 08/07/2022    4:59 PM 07/24/2022    4:59 PM  GAD 7 : Generalized Anxiety Score  Nervous, Anxious, on Edge 0 1 1 1   Control/stop worrying 0 0 0 0  Worry too much - different things 0 0 0 0  Trouble relaxing 0 0 0 0  Restless 0 0 0 0  Easily annoyed or irritable 2 2 3 2   Afraid - awful might happen 0 0 0 1  Total GAD 7 Score 2 3 4 4   Anxiety Difficulty Somewhat difficult Somewhat difficult Somewhat difficult Somewhat difficult       09/18/2022    4:58 PM 09/13/2022    3:10 PM 08/07/2022    4:58 PM  Depression screen PHQ 2/9  Decreased Interest 1 2 1   Down, Depressed, Hopeless 1 1 0  PHQ - 2 Score 2 3 1   Altered sleeping 2 1 1   Tired, decreased energy 2 1 2   Change in appetite 0 0 0  Feeling bad or failure about yourself  0 0 1  Trouble concentrating 0 0 0  Moving slowly or fidgety/restless 0 0 0  Suicidal thoughts 0 0 0  PHQ-9 Score 6 5 5   Difficult doing work/chores Somewhat difficult Somewhat difficult Somewhat difficult    Collaboration of Care: Psychiatrist AEB Dr. Elna Breslow  Patient/Guardian was advised Release of Information must be obtained prior to any record release in order to collaborate their care with an outside provider. Patient/Guardian was advised if they have not already done so to contact the registration department to sign all necessary forms in order for [place] to release information regarding their care.   Consent: Patient/Guardian gives verbal consent for treatment and assignment of benefits for services provided during  this visit. Patient/Guardian expressed understanding and agreed to proceed.   Geoffry Paradise, LCSW 09/19/2022

## 2022-09-27 ENCOUNTER — Encounter: Payer: BC Managed Care – PPO | Admitting: Physical Therapy

## 2022-10-09 ENCOUNTER — Ambulatory Visit (INDEPENDENT_AMBULATORY_CARE_PROVIDER_SITE_OTHER): Payer: BC Managed Care – PPO | Admitting: Licensed Clinical Social Worker

## 2022-10-09 DIAGNOSIS — F331 Major depressive disorder, recurrent, moderate: Secondary | ICD-10-CM

## 2022-10-09 DIAGNOSIS — F419 Anxiety disorder, unspecified: Secondary | ICD-10-CM

## 2022-10-10 ENCOUNTER — Other Ambulatory Visit: Payer: Self-pay | Admitting: Psychiatry

## 2022-10-10 DIAGNOSIS — F331 Major depressive disorder, recurrent, moderate: Secondary | ICD-10-CM

## 2022-10-10 DIAGNOSIS — F419 Anxiety disorder, unspecified: Secondary | ICD-10-CM

## 2022-10-12 ENCOUNTER — Ambulatory Visit: Payer: BC Managed Care – PPO | Admitting: Physical Therapy

## 2022-10-16 ENCOUNTER — Ambulatory Visit: Payer: BC Managed Care – PPO | Admitting: Licensed Clinical Social Worker

## 2022-10-18 ENCOUNTER — Ambulatory Visit (INDEPENDENT_AMBULATORY_CARE_PROVIDER_SITE_OTHER): Payer: BC Managed Care – PPO | Admitting: Licensed Clinical Social Worker

## 2022-10-18 DIAGNOSIS — Z91199 Patient's noncompliance with other medical treatment and regimen due to unspecified reason: Secondary | ICD-10-CM

## 2022-10-19 NOTE — Progress Notes (Signed)
Pt canceled day of appt due to child feeling unwell and needing to take child to doctor.

## 2022-10-23 ENCOUNTER — Ambulatory Visit (INDEPENDENT_AMBULATORY_CARE_PROVIDER_SITE_OTHER): Payer: BC Managed Care – PPO | Admitting: Licensed Clinical Social Worker

## 2022-10-23 DIAGNOSIS — F331 Major depressive disorder, recurrent, moderate: Secondary | ICD-10-CM | POA: Diagnosis not present

## 2022-10-23 DIAGNOSIS — F419 Anxiety disorder, unspecified: Secondary | ICD-10-CM

## 2022-10-26 ENCOUNTER — Ambulatory Visit: Payer: BC Managed Care – PPO | Attending: Family Medicine | Admitting: Physical Therapy

## 2022-11-06 ENCOUNTER — Ambulatory Visit: Payer: BC Managed Care – PPO | Admitting: Licensed Clinical Social Worker

## 2022-11-06 DIAGNOSIS — F419 Anxiety disorder, unspecified: Secondary | ICD-10-CM | POA: Diagnosis not present

## 2022-11-06 DIAGNOSIS — F331 Major depressive disorder, recurrent, moderate: Secondary | ICD-10-CM | POA: Diagnosis not present

## 2022-11-12 NOTE — Progress Notes (Signed)
THERAPIST PROGRESS NOTE  Session Time: 4:02PM-4:51PM  Participation Level: Active  Behavioral Response: Casual and Well GroomedAlertAnxious and Irritable  Type of Therapy: Individual Therapy  Treatment Goals addressed:  Reduce overall frequency, intensity and duration of anxiety so that daily functioning is not impaired per pt self report 3 out of 5 sessions.   Reduce overall frequency, intensity and duration of depression so that daily functioning is not impaired per pt self report 3 out of 5 sessions documented.   ProgressTowards Goals: Progressing  Interventions: CBT, DBT, Strength-based, Reframing, and Other: ACT  Virtual Visit via Video Note  I connected with Debra Bender on 11/06/2022 at  4:00 PM EDT by a video enabled telemedicine application and verified that I am speaking with the correct person using two identifiers.  Location: Patient: located in pt home Provider: working remotely in [place], [place]   I discussed the limitations of evaluation and management by telemedicine and the availability of in person appointments. The patient expressed understanding and agreed to proceed.  I discussed the assessment and treatment plan with the patient. The patient was provided an opportunity to ask questions and all were answered. The patient agreed with the plan and demonstrated an understanding of the instructions.   The patient was advised to call back or seek an in-person evaluation if the symptoms worsen or if the condition fails to improve as anticipated.  I provided 49 minutes of non-face-to-face time during this encounter.   Geoffry Paradise, LCSW  Summary: Debra Bender is a 37 y.o. female who presents with mixed symptoms of anxiety and depression including but not limited to fatigue, irritability, worry, difficulty controlling worry, negative self affect, lack of interest and lack of motivation. Patient oriented to person, place, and time. Pt denies SI/HI or A/V  hallucinations. Pt was cooperative during visit and was engaged throughout the visit. Pt does not report any other concerns at the time of visit.  Patient reported feeling some level of relief in her day-to-day life with her kids returning to school. Patient reported feeling nervous about starting work again and shared that it has been several years since she worked. Provided patient validation that returning to work, whether that be after a few weeks or a few years is always an adjustment and to practice self compassion with self and this process. Patient reported needing more support at home from her husband. Assisted patient in learning ways to initiate and engage in conversations about expectations with her spouse and her family. Normalized with patient that we have to get help to give help from time to time. Provided patient processing space regarding these matters. Patient reported no additional questions or concerns at this time.   LCSW provided mood monitoring and treatment progress review in the context of this episode of treatment. LCSW reviewed the pt's mood status since last session.   Pt is continuing to apply interventions/techniques learned in session into daily life situations. Pt is currently on track to meet goals utilizing interventions that are discussed in session. Treatment to continue as indicated. Personal growth and progress toward goals noted above.  Continued Recommendations as followed: Self-care behaviors, positive social engagements, focusing on positive physical and emotional wellness, and focusing on life/work balance.   Suicidal/Homicidal: Nowithout intent/plan  Therapist Response:  Provided pt education re: acceptance. Discussed how to make acceptance accessible at all parts of pt's healing journey.   LCSW practiced active listening to validate pt participation, build rapport, and create safe space  for pt to feel heard as they are disclosing their thoughts and  feelings.   Approached pt with strengths based perspective to assist pt in exploring strengths in moments of feeling low.   LCSW utilized therapeutic conversation skills informed by CBT, DBT, and ACT to expose pt to multiple ways of thinking about healing and to provide pt to access to multiple interventions.  Plan: Return again in 2 weeks.  Diagnosis: MDD (major depressive disorder), recurrent episode, moderate (HCC)  Anxiety disorder, unspecified type    10/23/2022    4:56 PM 09/18/2022    4:58 PM 09/13/2022    3:10 PM 08/07/2022    4:59 PM  GAD 7 : Generalized Anxiety Score  Nervous, Anxious, on Edge 1 0 1 1  Control/stop worrying 0 0 0 0  Worry too much - different things 0 0 0 0  Trouble relaxing 0 0 0 0  Restless 0 0 0 0  Easily annoyed or irritable 2 2 2 3   Afraid - awful might happen 0 0 0 0  Total GAD 7 Score 3 2 3 4   Anxiety Difficulty Somewhat difficult Somewhat difficult Somewhat difficult Somewhat difficult       10/23/2022    4:55 PM 09/18/2022    4:58 PM 09/13/2022    3:10 PM  Depression screen PHQ 2/9  Decreased Interest 1 1 2   Down, Depressed, Hopeless 0 1 1  PHQ - 2 Score 1 2 3   Altered sleeping 0 2 1  Tired, decreased energy 1 2 1   Change in appetite 0 0 0  Feeling bad or failure about yourself  0 0 0  Trouble concentrating 0 0 0  Moving slowly or fidgety/restless 0 0 0  Suicidal thoughts 0 0 0  PHQ-9 Score 2 6 5   Difficult doing work/chores Somewhat difficult Somewhat difficult Somewhat difficult    Collaboration of Care: Psychiatrist AEB Dr. Elna Breslow  Patient/Guardian was advised Release of Information must be obtained prior to any record release in order to collaborate their care with an outside provider. Patient/Guardian was advised if they have not already done so to contact the registration department to sign all necessary forms in order for [place] to release information regarding their care.   Consent: Patient/Guardian gives verbal consent for  treatment and assignment of benefits for services provided during this visit. Patient/Guardian expressed understanding and agreed to proceed.   Geoffry Paradise, LCSW 11/12/2022

## 2022-11-14 ENCOUNTER — Ambulatory Visit: Payer: BC Managed Care – PPO | Admitting: Psychiatry

## 2022-11-20 ENCOUNTER — Ambulatory Visit (INDEPENDENT_AMBULATORY_CARE_PROVIDER_SITE_OTHER): Payer: Self-pay | Admitting: Licensed Clinical Social Worker

## 2022-11-20 DIAGNOSIS — Z91199 Patient's noncompliance with other medical treatment and regimen due to unspecified reason: Secondary | ICD-10-CM

## 2022-11-20 NOTE — Progress Notes (Signed)
Pt called front desk to cancel day of. Pt marked as no show.

## 2022-11-21 ENCOUNTER — Ambulatory Visit: Payer: BC Managed Care – PPO | Attending: Family Medicine | Admitting: Physical Therapy

## 2022-11-27 ENCOUNTER — Ambulatory Visit (INDEPENDENT_AMBULATORY_CARE_PROVIDER_SITE_OTHER): Payer: BC Managed Care – PPO | Admitting: Licensed Clinical Social Worker

## 2022-11-27 DIAGNOSIS — Z91199 Patient's noncompliance with other medical treatment and regimen due to unspecified reason: Secondary | ICD-10-CM

## 2022-12-05 NOTE — Progress Notes (Signed)
Pt called front desk to late cancel day of.

## 2022-12-07 ENCOUNTER — Ambulatory Visit: Payer: BC Managed Care – PPO | Admitting: Psychiatry

## 2022-12-12 NOTE — Progress Notes (Signed)
THERAPIST PROGRESS NOTE  Session Time: 4:00PM-4:55PM  Participation Level: Active  Behavioral Response: CasualDrowsyDepressed and Irritable  Type of Therapy: Individual Therapy  Treatment Goals addressed:  Reduce overall frequency, intensity and duration of depression so that daily functioning is not impaired per pt self report 3 out of 5 sessions documented.   Recognize, accept and cope with feelings of depression per pt self report 3 out of 5 sessions.    ProgressTowards Goals: Progressing  Interventions: CBT, DBT, Motivational Interviewing, Strength-based, and Other: ACT  Virtual Visit via Video Note  I connected with Debra Bender on 09/11/2022 at  4:00 PM EDT by a video enabled telemedicine application and verified that I am speaking with the correct person using two identifiers.  Location: Patient: located in pt home Provider: working remotely in [place], [place]   I discussed the limitations of evaluation and management by telemedicine and the availability of in person appointments. The patient expressed understanding and agreed to proceed.  I discussed the assessment and treatment plan with the patient. The patient was provided an opportunity to ask questions and all were answered. The patient agreed with the plan and demonstrated an understanding of the instructions.   The patient was advised to call back or seek an in-person evaluation if the symptoms worsen or if the condition fails to improve as anticipated.  I provided 55 minutes of non-face-to-face time during this encounter.   Geoffry Paradise, LCSW;  Summary: Debra Bender is a 37 y.o. female who presents with mixed sxs of anxiety and depression. Sxs endorsed including but not limited to irritability, worry, difficulty controlling worry, depressed mood, lack of motivation, and lack of interest. Pt oriented to person, place, and time. Pt denies SI/HI or A/V hallucinations. Pt was cooperative during visit and  was engaged throughout the visit. Pt does not report any other concerns at the time of visit.  Pt utilized therapeutic space to process marital concerns. Pt shared that she feels she and her husband are only together because of the kids and identified ways in which they both have changed since first meeting. Pt explored how they first met and reported that their relationship changed after having kids. Pt reported loving being a parent and also reported difficulty in being a parent alone. Pt shared that she and her husband do not engage in relationship maintenance activities. Pt reported that she does not communicate her concerns to her husband. Used MI to discuss pt's actions and words activating in dissonance and invited pt to consider communication with husband. Invited pt to work with husband to find a standing time of 2hrs a week where pt and spouse can invest in their relationship and communicate.   Invited pt to go on a first date with husband.   LCSW provided mood monitoring and treatment progress review in the context of this episode of treatment. LCSW reviewed the pt's mood status since last session.   Pt is continuing to apply interventions/techniques learned in session into daily life situations. Pt is currently on track to meet goals utilizing interventions that are discussed in session. Treatment to continue as indicated. Personal growth and progress toward goals noted above.  Continued Recommendations as followed: Self-care behaviors, positive social engagements, focusing on positive physical and emotional wellness, and focusing on life/work balance.    Suicidal/Homicidal: Nowithout intent/plan  Therapist Response:  Provided pt education re: acceptance. Discussed how to make acceptance accessible at all parts of pt's healing journey.   Approached pt  with strengths based perspective to assist pt in exploring strengths in moments of feeling low.   LCSW practiced active listening to  validate pt participation, build rapport, and create safe space for pt to feel heard as they are disclosing their thoughts and feelings.   LCSW utilized therapeutic conversation skills informed by CBT, DBT, and ACT to expose pt to multiple ways of thinking about healing and to provide pt to access to multiple interventions.  Used MI to assess pt readiness for change.   Plan: Return again in 2 weeks.  Diagnosis: Depression, major, recurrent, in partial remission (HCC)    10/23/2022    4:56 PM 09/18/2022    4:58 PM 09/13/2022    3:10 PM 08/07/2022    4:59 PM  GAD 7 : Generalized Anxiety Score  Nervous, Anxious, on Edge 1 0 1 1  Control/stop worrying 0 0 0 0  Worry too much - different things 0 0 0 0  Trouble relaxing 0 0 0 0  Restless 0 0 0 0  Easily annoyed or irritable 2 2 2 3   Afraid - awful might happen 0 0 0 0  Total GAD 7 Score 3 2 3 4   Anxiety Difficulty Somewhat difficult Somewhat difficult Somewhat difficult Somewhat difficult       10/23/2022    4:55 PM 09/18/2022    4:58 PM 09/13/2022    3:10 PM  Depression screen PHQ 2/9  Decreased Interest 1 1 2   Down, Depressed, Hopeless 0 1 1  PHQ - 2 Score 1 2 3   Altered sleeping 0 2 1  Tired, decreased energy 1 2 1   Change in appetite 0 0 0  Feeling bad or failure about yourself  0 0 0  Trouble concentrating 0 0 0  Moving slowly or fidgety/restless 0 0 0  Suicidal thoughts 0 0 0  PHQ-9 Score 2 6 5   Difficult doing work/chores Somewhat difficult Somewhat difficult Somewhat difficult    Collaboration of Care: Other N/A  Patient/Guardian was advised Release of Information must be obtained prior to any record release in order to collaborate their care with an outside provider. Patient/Guardian was advised if they have not already done so to contact the registration department to sign all necessary forms in order for [place] to release information regarding their care.   Consent: Patient/Guardian gives verbal consent for treatment and  assignment of benefits for services provided during this visit. Patient/Guardian expressed understanding and agreed to proceed.   Memory Dance Miklos Bidinger, LCSW

## 2022-12-12 NOTE — Progress Notes (Signed)
THERAPIST PROGRESS NOTE  Session Time: 4:00PM-4:51PM  Participation Level: Active  Behavioral Response: CasualAlertAnxious and Depressed  Type of Therapy: Individual Therapy  Treatment Goals addressed:  Recall traumatic events without becoming overwhelmed with negative emotions  Increase coping skills to manage depression and improve ability to perform daily activities  Reduce frequency, intensity, and duration of depression symptoms so that daily functioning is improved  ProgressTowards Goals: Progressing  Interventions: CBT, DBT, Motivational Interviewing, Strength-based, and Other: ACT  Summary: Debra Bender is a 37 y.o. female who presents with mixed sxs of anxiety and depression related to a hx of trauma and experiences with current stressors. Sxs endorsed including but not limited to nervousness, fatigue, irritability, negative self affect, anxious thoughts, and fearfulness. Pt oriented to person, place, and time. Pt denies SI/HI or A/V hallucinations. Pt was cooperative during visit and was engaged throughout the visit. Pt does not report any other concerns at the time of visit.  Pt utilized therapeutic space to process marital frustrations and parenting stress.    Pt reported feeling inspired to get back into church upon passing of her student. Pt identified barriers to returning to church such as feeling judged by parent. Provided pt processing space. Pt engaged in inner child work and explored ways that her adult self has power that her inner child did not possess. Pt engaged in discussion on values directed goal exploration. Pt identified values in therapy and barriers to aligning with values. Pt explored ways to overcome barriers to values driven goal pursuit. Provided pt validation of normalcy of values changing as self changes. Invited client to reflect on how values grow and shift with self growth and shifts. Educated pt on cognitive dissonance that occurs with  engaging in behaviors that contradict values.   Encouraged pt to explore what it would mean to go to bed happy with self.   Pt is continuing to apply interventions/techniques learned in session into daily life situations. Pt is currently on track to meet goals utilizing interventions that are discussed in session. Treatment to continue as indicated. Personal growth and progress toward goals noted above.  LCSW provided mood monitoring and treatment progress review in the context of this episode of treatment. LCSW reviewed the pt's mood status since last session.   Continued Recommendations as followed: Self-care behaviors, positive social engagements, focusing on positive physical and emotional wellness, and focusing on life/work balance.    Suicidal/Homicidal: Nowithout intent/plan  Therapist Response:  Provided pt education re: acceptance. Discussed how to make acceptance accessible at all parts of pt's healing journey.   Approached pt with strengths based perspective to assist pt in exploring strengths in moments of feeling low.   LCSW practiced active listening to validate pt participation, build rapport, and create safe space for pt to feel heard as they are disclosing their thoughts and feelings.   LCSW utilized therapeutic conversation skills informed by CBT, DBT, and ACT to expose pt to multiple ways of thinking about healing and to provide pt to access to multiple interventions.  LCSW introduced pt to Acceptance and Commitment Therapy. Pt engaged in discussion on how to explore what they must accept in order to commit to what they have identified as important. Introduced pt to values directed goal exploration in order to identify goals of importance.    Plan: Return again in 2 weeks.  Diagnosis: MDD (major depressive disorder), recurrent episode, moderate (HCC)  Anxiety disorder, unspecified type    10/23/2022    4:56 PM  09/18/2022    4:58 PM 09/13/2022    3:10 PM 08/07/2022     4:59 PM  GAD 7 : Generalized Anxiety Score  Nervous, Anxious, on Edge 1 0 1 1  Control/stop worrying 0 0 0 0  Worry too much - different things 0 0 0 0  Trouble relaxing 0 0 0 0  Restless 0 0 0 0  Easily annoyed or irritable 2 2 2 3   Afraid - awful might happen 0 0 0 0  Total GAD 7 Score 3 2 3 4   Anxiety Difficulty Somewhat difficult Somewhat difficult Somewhat difficult Somewhat difficult       10/23/2022    4:55 PM 09/18/2022    4:58 PM 09/13/2022    3:10 PM  Depression screen PHQ 2/9  Decreased Interest 1 1 2   Down, Depressed, Hopeless 0 1 1  PHQ - 2 Score 1 2 3   Altered sleeping 0 2 1  Tired, decreased energy 1 2 1   Change in appetite 0 0 0  Feeling bad or failure about yourself  0 0 0  Trouble concentrating 0 0 0  Moving slowly or fidgety/restless 0 0 0  Suicidal thoughts 0 0 0  PHQ-9 Score 2 6 5   Difficult doing work/chores Somewhat difficult Somewhat difficult Somewhat difficult    Collaboration of Care: Psychiatrist AEB Dr. Elna Breslow  Patient/Guardian was advised Release of Information must be obtained prior to any record release in order to collaborate their care with an outside provider. Patient/Guardian was advised if they have not already done so to contact the registration department to sign all necessary forms in order for [place] to release information regarding their care.   Consent: Patient/Guardian gives verbal consent for treatment and assignment of benefits for services provided during this visit. Patient/Guardian expressed understanding and agreed to proceed.   Memory Dance Timica Marcom, LCSW

## 2022-12-12 NOTE — Progress Notes (Signed)
THERAPIST PROGRESS NOTE  Session Time: 4:05PM-5:00PM  Participation Level: Active  Behavioral Response: CasualAlertAnxious and Depressed  Type of Therapy: Individual Therapy  Treatment Goals addressed:  Recall traumatic events without becoming overwhelmed with negative emotions  Increase coping skills to manage depression and improve ability to perform daily activities  Reduce frequency, intensity, and duration of depression symptoms so that daily functioning is improved  ProgressTowards Goals: Progressing  Interventions: CBT, DBT, Motivational Interviewing, Strength-based, and Other: ACT  Summary: Debra Bender is a 37 y.o. female who presents with mixed sxs of anxiety and depression related to a hx of trauma and experiences with current stressors. Sxs endorsed including but not limited to nervousness, fatigue, irritability, negative self affect, anxious thoughts, and fearfulness. Pt oriented to person, place, and time. Pt denies SI/HI or A/V hallucinations. Pt was cooperative during visit and was engaged throughout the visit. Pt does not report any other concerns at the time of visit.  Pt utilized therapeutic space to process marital frustrations and parenting stress.    Pt reported feeling inspired to get back into church upon passing of her student. Pt identified barriers to returning to church such as feeling judged by parent. Provided pt processing space. Pt engaged in inner child work and explored ways that her adult self has power that her inner child did not possess. Pt engaged in discussion on values directed goal exploration. Pt identified values in therapy and barriers to aligning with values. Pt explored ways to overcome barriers to values driven goal pursuit. Provided pt validation of normalcy of values changing as self changes. Invited client to reflect on how values grow and shift with self growth and shifts. Educated pt on cognitive dissonance that occurs with  engaging in behaviors that contradict values.   Encouraged pt to explore what it would mean to go to bed happy with self.   Pt is continuing to apply interventions/techniques learned in session into daily life situations. Pt is currently on track to meet goals utilizing interventions that are discussed in session. Treatment to continue as indicated. Personal growth and progress toward goals noted above.  LCSW provided mood monitoring and treatment progress review in the context of this episode of treatment. LCSW reviewed the pt's mood status since last session.   Continued Recommendations as followed: Self-care behaviors, positive social engagements, focusing on positive physical and emotional wellness, and focusing on life/work balance.    Suicidal/Homicidal: Nowithout intent/plan  Therapist Response:  Provided pt education re: acceptance. Discussed how to make acceptance accessible at all parts of pt's healing journey.   Approached pt with strengths based perspective to assist pt in exploring strengths in moments of feeling low.   LCSW practiced active listening to validate pt participation, build rapport, and create safe space for pt to feel heard as they are disclosing their thoughts and feelings.   LCSW utilized therapeutic conversation skills informed by CBT, DBT, and ACT to expose pt to multiple ways of thinking about healing and to provide pt to access to multiple interventions.  LCSW introduced pt to Acceptance and Commitment Therapy. Pt engaged in discussion on how to explore what they must accept in order to commit to what they have identified as important. Introduced pt to values directed goal exploration in order to identify goals of importance.    Plan: Return again in 2 weeks.  Diagnosis: MDD (major depressive disorder), recurrent episode, moderate (HCC)  Anxiety disorder, unspecified type    10/23/2022    4:56 PM  09/18/2022    4:58 PM 09/13/2022    3:10 PM 08/07/2022     4:59 PM  GAD 7 : Generalized Anxiety Score  Nervous, Anxious, on Edge 1 0 1 1  Control/stop worrying 0 0 0 0  Worry too much - different things 0 0 0 0  Trouble relaxing 0 0 0 0  Restless 0 0 0 0  Easily annoyed or irritable 2 2 2 3   Afraid - awful might happen 0 0 0 0  Total GAD 7 Score 3 2 3 4   Anxiety Difficulty Somewhat difficult Somewhat difficult Somewhat difficult Somewhat difficult       10/23/2022    4:55 PM 09/18/2022    4:58 PM 09/13/2022    3:10 PM  Depression screen PHQ 2/9  Decreased Interest 1 1 2   Down, Depressed, Hopeless 0 1 1  PHQ - 2 Score 1 2 3   Altered sleeping 0 2 1  Tired, decreased energy 1 2 1   Change in appetite 0 0 0  Feeling bad or failure about yourself  0 0 0  Trouble concentrating 0 0 0  Moving slowly or fidgety/restless 0 0 0  Suicidal thoughts 0 0 0  PHQ-9 Score 2 6 5   Difficult doing work/chores Somewhat difficult Somewhat difficult Somewhat difficult    Collaboration of Care: Psychiatrist AEB Dr. Elna Breslow  Patient/Guardian was advised Release of Information must be obtained prior to any record release in order to collaborate their care with an outside provider. Patient/Guardian was advised if they have not already done so to contact the registration department to sign all necessary forms in order for [place] to release information regarding their care.   Consent: Patient/Guardian gives verbal consent for treatment and assignment of benefits for services provided during this visit. Patient/Guardian expressed understanding and agreed to proceed.   Memory Dance Carletha Dawn, LCSW

## 2022-12-20 ENCOUNTER — Encounter: Payer: Self-pay | Admitting: Psychiatry

## 2022-12-20 ENCOUNTER — Ambulatory Visit (INDEPENDENT_AMBULATORY_CARE_PROVIDER_SITE_OTHER): Payer: BC Managed Care – PPO | Admitting: Psychiatry

## 2022-12-20 ENCOUNTER — Other Ambulatory Visit: Payer: Self-pay | Admitting: Psychiatry

## 2022-12-20 VITALS — BP 128/98 | HR 97 | Temp 96.2°F | Ht 64.5 in | Wt 275.4 lb

## 2022-12-20 DIAGNOSIS — F3341 Major depressive disorder, recurrent, in partial remission: Secondary | ICD-10-CM | POA: Diagnosis not present

## 2022-12-20 DIAGNOSIS — F411 Generalized anxiety disorder: Secondary | ICD-10-CM

## 2022-12-20 DIAGNOSIS — Z634 Disappearance and death of family member: Secondary | ICD-10-CM | POA: Diagnosis not present

## 2022-12-20 MED ORDER — MIRTAZAPINE 15 MG PO TABS
15.0000 mg | ORAL_TABLET | Freq: Every day | ORAL | 0 refills | Status: DC
Start: 2022-12-20 — End: 2023-03-16

## 2022-12-20 MED ORDER — HYDROXYZINE PAMOATE 25 MG PO CAPS
25.0000 mg | ORAL_CAPSULE | Freq: Every evening | ORAL | 1 refills | Status: DC | PRN
Start: 2022-12-20 — End: 2023-04-18

## 2022-12-20 MED ORDER — MIRTAZAPINE 7.5 MG PO TABS: 15.0000 mg | ORAL_TABLET | Freq: Every day | ORAL | 0 refills | Status: DC

## 2022-12-20 NOTE — Telephone Encounter (Signed)
I have sent mirtazapine 15 mg to pharmacy since insurance will not cover 2 tablets of 7.5 mg.

## 2022-12-20 NOTE — Progress Notes (Signed)
BH MD OP Progress Note  12/20/2022 1:53 PM Debra Bender  MRN:  [id number]  Chief Complaint:  Chief Complaint  Patient presents with   Follow-up   Depression   Anxiety   Medication Refill   grief   HPI: Debra Bender is a 37 year old Caucasian female, currently unemployed, lives in [place], married, has a history of MDD, GAD, was evaluated in office today.  Patient today reports she just lost her aunt last Friday.  She reports her aunt was struggling with colon cancer.  She reports she hence has been trying to support her cousin who just lost both her parents recently.  Patient also is grieving her aunt's loss, they were really close.  Patient reports she has started taking the mirtazapine however she needs the melatonin also in combination to help her to fall asleep.  She denies any side effects.  She reports she tried taking the mirtazapine by itself and that does not seem to help with falling asleep.  She continues to have a lot of racing thoughts at night.  She constantly worries about everything.  She currently worries about her mother, her cousin, due to the recent death in the family.  Patient reports her therapist left the practice and hence she has been trying to find a therapist.  She is motivated to stay in therapy.  She denies any suicidality, homicidality or perceptual disturbances.  Patient denies any other concerns today.  Visit Diagnosis:    ICD-10-CM   1. MDD (major depressive disorder), recurrent, in partial remission (HCC)  F33.41 DISCONTINUED: mirtazapine (REMERON) 7.5 MG tablet   moderate    2. Generalized anxiety disorder  F41.1 hydrOXYzine (VISTARIL) 25 MG capsule    3. Bereavement  Z63.4       Past Psychiatric History: I have reviewed past psychiatric history from my progress note on 09/13/2022.  Past Medical History:  Past Medical History:  Diagnosis Date   Anemia 12/2014   Depression    GERD (gastroesophageal reflux disease)    Hypertension     Hypothyroidism    during pregnancy   Obesity affecting pregnancy    PTSD (post-traumatic stress disorder)     Past Surgical History:  Procedure Laterality Date   CESAREAN SECTION N/A 11/20/2018   Procedure: CESAREAN SECTION;  Surgeon: Nadara Mustard, MD;  Location: [facility];  Service: Obstetrics;  Laterality: N/A;  [id number] [id number] [id number]   FRACTURE SURGERY Right 37 years old   MINOR EXCISION OF ORAL LESION Left 07/13/2022   Procedure: EXCISION LESION ORAL VESTIBULE;  Surgeon: Vernie Murders, MD;  Location: [facility];  Service: ENT;  Laterality: Left;   TONSILLECTOMY Bilateral 01/15/2020   Procedure: TONSILLECTOMY;  Surgeon: Vernie Murders, MD;  Location: [facility];  Service: ENT;  Laterality: Bilateral;  Latex    Family Psychiatric History: I have reviewed family psychiatric history from my progress note on 09/13/2022.  Family History:  Family History  Problem Relation Age of Onset   Depression Mother    Heart disease Father    Asthma Brother    Alcohol abuse Maternal Uncle    Alcohol abuse Paternal Uncle    Alcohol abuse Paternal Uncle    Diabetes Maternal Grandfather    Emphysema Maternal Grandmother    Alcohol abuse Paternal Grandfather    Heart disease Paternal Grandfather    Cirrhosis Paternal Grandmother     Social History: I have reviewed social history from my progress note on 09/13/2022. Social History  Socioeconomic History   Marital status: Married    Spouse name: Aurther Loft   Number of children: 3   Years of education: Not on file   Highest education level: Some college, no degree  Occupational History   Not on file  Tobacco Use   Smoking status: Former    Current packs/day: 0.00    Types: Cigarettes    Quit date: 07/31/2014    Years since quitting: 8.3   Smokeless tobacco: Never  Vaping Use   Vaping status: Every Day   Substances: Nicotine   Devices: Vaporesso  Substance and Sexual Activity   Alcohol use: No   Drug  use: No   Sexual activity: Yes    Birth control/protection: Injection  Other Topics Concern   Not on file  Social History Narrative   Not on file   Social Determinants of Health   Financial Resource Strain: Low Risk  (09/27/2022)   Received from [facility], [facility]   Overall Financial Resource Strain (CARDIA)    Difficulty of Paying Living Expenses: Not hard at all  Food Insecurity: No Food Insecurity (09/27/2022)   Received from [facility], [facility]   Hunger Vital Sign    Worried About Running Out of Food in the Last Year: Never true    Ran Out of Food in the Last Year: Never true  Transportation Needs: No Transportation Needs (09/27/2022)   Received from [facility], [facility]   [facility] - Transportation    In the past 12 months, has lack of transportation kept you from medical appointments or from getting medications?: No    Lack of Transportation (Non-Medical): No  Physical Activity: Not on file  Stress: Not on file  Social Connections: Not on file    Allergies:  Allergies  Allergen Reactions   Salmon [Fish Allergy] Anaphylaxis    Tongue, lips and finger tips get tingly   Hydrochlorothiazide Other (See Comments)    Elevated creatinine   Latex Itching and Rash    Condoms, gloves   Tape Rash    "Medical Tape"    Metabolic Disorder Labs: No results found for: "HGBA1C", "MPG" No results found for: "PROLACTIN" No results found for: "CHOL", "TRIG", "HDL", "CHOLHDL", "VLDL", "LDLCALC" Lab Results  Component Value Date   TSH 2.210 09/12/2018   TSH 4.680 (H) 07/04/2018    Therapeutic Level Labs: No results found for: "LITHIUM" No results found for: "VALPROATE" No results found for: "CBMZ"  Current Medications: Current Outpatient Medications  Medication Sig Dispense Refill   hydrOXYzine (VISTARIL) 25 MG capsule Take 1 capsule (25 mg total) by mouth  at bedtime as needed. Do not take with Benadryl 30 capsule 1   busPIRone (BUSPAR) 10 MG tablet Take 10 mg by mouth 2 (two) times daily.     diphenhydrAMINE (BENADRYL) 25 mg capsule Take 25 mg by mouth every 6 (six) hours as needed.     FLUoxetine (PROZAC) 40 MG capsule Take 40 mg by mouth daily.     mirtazapine (REMERON) 15 MG tablet Take 1 tablet (15 mg total) by mouth at bedtime. 90 tablet 0   No current facility-administered medications for this visit.     Musculoskeletal: Strength & Muscle Tone: within normal limits Gait & Station: normal Patient leans: N/A  Psychiatric Specialty Exam: Review of Systems  Psychiatric/Behavioral:  Positive for sleep disturbance. The patient is nervous/anxious.        Grief  Blood pressure (!) 128/98, pulse 97, temperature (!) 96.2 F (35.7 C), temperature source Skin, height 5' 4.5" (1.638 m), weight 275 lb 6.4 oz (124.9 kg).Body mass index is 46.54 kg/m.  General Appearance: Fairly Groomed  Eye Contact:  Fair  Speech:  Clear and Coherent  Volume:  Normal  Mood:   grief , anxiety  Affect:  Congruent  Thought Process:  Goal Directed and Descriptions of Associations: Intact  Orientation:  Full (Time, Place, and Person)  Thought Content: Logical   Suicidal Thoughts:  No  Homicidal Thoughts:  No  Memory:  Immediate;   Fair Recent;   Fair Remote;   Fair  Judgement:  Fair  Insight:  Good  Psychomotor Activity:  Normal  Concentration:  Concentration: Fair and Attention Span: Fair  Recall:  [employer] of Knowledge: Fair  Language: Fair  Akathisia:  No  Handed:  Right  AIMS (if indicated): not done  Assets:  Communication Skills Desire for Improvement Housing Social Support  ADL's:  Intact  Cognition: WNL  Sleep:   improving   Screenings: GAD-7    Loss adjuster, chartered Office Visit from 12/20/2022 in [facility] Counselor from 10/23/2022 in [facility]  Counselor from 09/18/2022 in [facility] Office Visit from 09/13/2022 in [facility] Counselor from 08/07/2022 in [facility]  Total GAD-7 Score 1 3 2 3 4       PHQ2-9    Flowsheet Row Office Visit from 12/20/2022 in [facility] Counselor from 10/23/2022 in [facility] Counselor from 09/18/2022 in [facility] Office Visit from 09/13/2022 in [facility] Counselor from 08/07/2022 in [facility]  PHQ-2 Total Score 2 1 2 3 1   PHQ-9 Total Score 6 2 6 5 5       Flowsheet Row Office Visit from 12/20/2022 in [facility] Counselor from 10/23/2022 in [facility] Counselor from 09/18/2022 in [facility]  C-SSRS RISK CATEGORY No Risk No Risk No Risk        Assessment and Plan: Debra Bender is a 37 year old Caucasian female, married, unemployed, lives in [place], has a history of depression, anxiety, recent grief reaction, continues to have sleep problems, anxiety although making some progress, plan as noted below.  Plan MDD in partial remission Continue BuSpar 10 mg p.o. twice daily Continue Prozac 40 mg p.o. daily  GAD-unstable Increase mirtazapine to 15 mg p.o. nightly And hydroxyzine 25 mg at bedtime as needed for anxiety and sleep Patient could also continue melatonin as needed at bedtime Patient to monitor for drug to drug including serotonin syndrome.  Will consider tapering off BuSpar if she has a good response to mirtazapine.  Bereavement-unstable Patient to reestablish care with therapist.  Patient used to follow up with Ms. Primus Bravo. Provided grief  counseling for 5 minutes.    Collaboration of Care: Collaboration of Care: Referral or follow-up with counselor/therapist AEB patient encouraged to reestablish care with therapist.  Patient/Guardian was advised Release of Information must be obtained prior to any record release in order to collaborate their care with an outside provider. Patient/Guardian was advised if they have not already done so to contact the registration department to sign all necessary forms in order for [place] to release  information regarding their care.   Consent: Patient/Guardian gives verbal consent for treatment and assignment of benefits for services provided during this visit. Patient/Guardian expressed understanding and agreed to proceed.   Follow-up in clinic in 2 to 3 months or sooner if needed.  This note was generated in part or whole with voice recognition software. Voice recognition is usually quite accurate but there are transcription errors that can and very often do occur. I apologize for any typographical errors that were not detected and corrected.    Jomarie Longs, MD 12/20/2022, 1:53 PM

## 2023-01-13 ENCOUNTER — Other Ambulatory Visit: Payer: Self-pay | Admitting: Psychiatry

## 2023-01-13 DIAGNOSIS — F419 Anxiety disorder, unspecified: Secondary | ICD-10-CM

## 2023-01-13 DIAGNOSIS — F331 Major depressive disorder, recurrent, moderate: Secondary | ICD-10-CM

## 2023-01-15 ENCOUNTER — Ambulatory Visit: Payer: BC Managed Care – PPO | Admitting: Psychiatry

## 2023-01-23 ENCOUNTER — Ambulatory Visit (INDEPENDENT_AMBULATORY_CARE_PROVIDER_SITE_OTHER): Payer: BC Managed Care – PPO | Admitting: Licensed Clinical Social Worker

## 2023-01-23 DIAGNOSIS — F331 Major depressive disorder, recurrent, moderate: Secondary | ICD-10-CM

## 2023-01-23 DIAGNOSIS — F4323 Adjustment disorder with mixed anxiety and depressed mood: Secondary | ICD-10-CM | POA: Diagnosis not present

## 2023-01-23 DIAGNOSIS — F411 Generalized anxiety disorder: Secondary | ICD-10-CM

## 2023-01-23 DIAGNOSIS — Z634 Disappearance and death of family member: Secondary | ICD-10-CM
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
## 2023-02-07 ENCOUNTER — Ambulatory Visit (INDEPENDENT_AMBULATORY_CARE_PROVIDER_SITE_OTHER): Payer: BC Managed Care – PPO | Admitting: Licensed Clinical Social Worker

## 2023-02-07 DIAGNOSIS — F3341 Major depressive disorder, recurrent, in partial remission: Secondary | ICD-10-CM | POA: Diagnosis not present

## 2023-02-07 DIAGNOSIS — Z634 Disappearance and death of family member: Secondary | ICD-10-CM | POA: Diagnosis not present

## 2023-02-07 DIAGNOSIS — F411 Generalized anxiety disorder: Secondary | ICD-10-CM

## 2023-02-07 NOTE — Progress Notes (Signed)
   THERAPIST PROGRESS NOTE  Session Time: 9:02am-10:01am  Participation Level: Active  Behavioral Response: CasualAlertEuthymic  Type of Therapy: Individual Therapy  Treatment Goals addressed:  LTG: Increase coping skills to manage depression and improve ability to perform daily activities    ProgressTowards Goals: Progressing  Interventions: Assertiveness Training, Psychosocial Skills: Communication, and Supportive  Summary: Debra Bender is a 37 y.o. female who presents with mixed symptoms of anxiety and depression related to current stressors.  Symptoms include but are not limited to fatigue, irritability, negative self affect, anxious thoughts.  Patient oriented x 5.  Patient denies SI/HI or A/V hallucinations.  Patient was cooperative during her session and was engaged throughout the visit.  Utilized therapeutic visit to process recent stressors related to the death of a "longtime friend who lost her grandma. "Patient shared close relationship ties with his family as she and her mother supported the children while they were growing up; identifying them as "like [her] own kids."  She reflected on concerns regarding the mental health of one of the children citing recent hospitalizations and self harming behavior.  Processed frustrations related to limited control and feeling she cannot help her because she is living with her biological mother.  Processed ways in which she can support this child through the grieving process of losing her grandmother.  Discussed support. She identified she can connect with her mother on this issue.  Patient reported feeling frustrated with her husband based on differing expectations around parenting and possible behaviors that foster the idea favoritism among their children.  Patient also utilized therapeutic space to process frustrations as a parent as her kids are struggling to follow directions.  Identified for self-care this week she will find time to  catch up on episodes of her favorite TV show.  Suicidal/Homicidal: Nowithout intent/plan  Therapist Response: Cln. created a safe and supportive environment for patient to process recent stressors and to explore feelings about recent experiences.  Supported patient in processing emotions related to navigating relationships with "niece."  Empowered patient to set healthy boundaries.  Reinforced patient's positive coping strategies and explored finding time for self-care.  Patient explored with therapist ways she can communicate expectations around parenting with her husband.  Therapist explored clients parenting style and discussed learned parenting styles both her and her husband have picked up on from their childhood.  Plan: Return again in 2 weeks.  Diagnosis: MDD (major depressive disorder), recurrent, in partial remission (HCC)  Generalized anxiety disorder  Bereavement   Collaboration of Care: Psychiatrist AEB   psychiatrist can access notes and cln. Will review psychiatrists' notes. Check in with the patient and will see LCSW per availability. Patient agreed with treatment recommendations. Pt. is scheduled for a follow-up in 2 weeks.    Patient/Guardian was advised Release of Information must be obtained prior to any record release in order to collaborate their care with an outside provider. Patient/Guardian was advised if they have not already done so to contact the registration department to sign all necessary forms in order for [place] to release information regarding their care.   Consent: Patient/Guardian gives verbal consent for treatment and assignment of benefits for services provided during this visit. Patient/Guardian expressed understanding and agreed to proceed.   Dereck Leep, LCSW 02/07/2023

## 2023-02-21 ENCOUNTER — Ambulatory Visit (INDEPENDENT_AMBULATORY_CARE_PROVIDER_SITE_OTHER): Payer: Self-pay | Admitting: Licensed Clinical Social Worker

## 2023-02-21 DIAGNOSIS — Z91199 Patient's noncompliance with other medical treatment and regimen due to unspecified reason: Secondary | ICD-10-CM

## 2023-02-21 NOTE — Progress Notes (Signed)
Clinician attempted session via face-to-face, but Debra Bender did not appear for her session. Cln. called pt. LVM about rescheduling.

## 2023-02-26 ENCOUNTER — Ambulatory Visit: Payer: BC Managed Care – PPO | Admitting: Psychiatry

## 2023-03-07 ENCOUNTER — Ambulatory Visit (INDEPENDENT_AMBULATORY_CARE_PROVIDER_SITE_OTHER): Payer: BC Managed Care – PPO | Admitting: Licensed Clinical Social Worker

## 2023-03-07 DIAGNOSIS — F3341 Major depressive disorder, recurrent, in partial remission: Secondary | ICD-10-CM | POA: Diagnosis not present

## 2023-03-07 DIAGNOSIS — F411 Generalized anxiety disorder: Secondary | ICD-10-CM | POA: Diagnosis not present

## 2023-03-07 DIAGNOSIS — Z634 Disappearance and death of family member: Secondary | ICD-10-CM

## 2023-03-07 NOTE — Progress Notes (Deleted)
Debra Bender is a 37 y.o. female in treatment for *** and displays the following risk factors for Suicide:  Demographic factors:  {CHL AMB BH Suicide Demographics:21022747:a} Current Mental Status: {CHL AMB BH Suicide Current Mental Status:21022748:a} Loss Factors: {CHL AMB BH Suicide Loss Factors:21022749:a} Historical Factors: {CHL AMB BH Suicide Historical Factors:21022750:a} Risk Reduction Factors: {CHL AMB BH Suicide Risk Reduction Factors:21022751:a}  CLINICAL FACTORS:  {Clinical Factors:22706}  COGNITIVE FEATURES THAT CONTRIBUTE TO RISK: {Cognitive Features:22703}    SUICIDE RISK:  {BHH SUICIDE RISK:22704}  Mental Status: *** General Appearance /Behavior:  {BHH GENERAL APPEARANCE/BEHAVIOR:22300} Eye Contact:  {BHH EYE CONTACT:22301} Motor Behavior:  {BHH MOTOR BEHAVIOR:22302} Speech:  {BHH SPEECH:22304} Level of Consciousness:  {BHH LEVEL OF CONSCIOUSNESS:22305} Mood:  {BHH MOOD:22306} Affect:  {BHH AFFECT:22307} Anxiety Level:  {BHH ANXIETY LEVEL:22308} Thought Process:  {Thought Process (PAA):22688} Thought Content: {Thought Content:22690} Perception:  {BHH PERCEPTION:22311} Judgment:  {BHH JUDGMENT:22312} Insight:  {BHH INSIGHT:22313} Cognition:  {BHH COGNITION:22314} Sleep: ***  PLAN OF CARE: ***   Dereck Leep, LCSW 03/07/2023, 10:42 AM

## 2023-03-07 NOTE — Progress Notes (Signed)
   THERAPIST PROGRESS NOTE  Session Time: 9:02am-10:05am  Participation Level: Active  Behavioral Response: CasualAlertDepressed  Type of Therapy: Individual Therapy  Treatment Goals addressed: LTG: Increase coping skills to manage depression and improve ability to perform daily activities   Reduce overall frequency, intensity and duration of depression so that daily functioning is not impaired per pt self report 3 out of 5 sessions documented.   ProgressTowards Goals: Progressing  Interventions: CBT, Supportive, and Reframing  Summary: Debra Bender is a 37 y.o. female who presents with depressive symptoms citing anhedonia, tearfulness, flat affect, lack of motivation, low mood. Pt was oriented times 5. Pt was cooperative and engaged. Pt denies SI/HI/AVH.     Patient utilized therapeutic space to reflect on recent holidays and the opportunity to spend time with her niece.  Patient continues to identify barriers to supporting her niece.   Processed current state of marriage and patient's thought process regarding her relationship.  Patient identifies barriers to maintaining relationship is regarding worries about others perceptions and fear she will not have support.  Patient became tearful stating thoughts such as "I failed."  Clinician worked with patient to reframe this unhelpful thoughts and identify ways in which she has fought for her relationship.  Reflected on patient's support system.  Addressed the pros and cons to her decision.  Patient reflected on history of self-harm and identified techniques she has used to mitigate desires to engage in cutting.  Reviewed coping skills.  Identified ways in which she is engaging in self-care.  Suicidal/Homicidal: Nowithout intent/plan  Therapist Response: Cln utilized active and supportive reflection to create environment for patient to process recent life stressors and symptoms.  Clinician assessed for recent stressors, symptoms, and  safety since last session.  Clinician provided supportive feedback as patient processed the state of her marriage.  Reviewed CBT techniques specifically to reframe the negative cognition "I failed."  Addressed ways in which patient is continuing to demonstrate strength and encouraged patient to utilize coping skills and self-care techniques during this time.  Plan: Return again in 2 weeks.  Diagnosis: MDD (major depressive disorder), recurrent, in partial remission (HCC)  Bereavement  Generalized anxiety disorder   Collaboration of Care: AEB psychiatrist can access notes and cln. Will review psychiatrists' notes. Check in with the patient and will see LCSW per availability. Patient agreed with treatment recommendations.   Patient/Guardian was advised Release of Information must be obtained prior to any record release in order to collaborate their care with an outside provider. Patient/Guardian was advised if they have not already done so to contact the registration department to sign all necessary forms in order for [place] to release information regarding their care.   Consent: Patient/Guardian gives verbal consent for treatment and assignment of benefits for services provided during this visit. Patient/Guardian expressed understanding and agreed to proceed.   Dereck Leep, LCSW 03/07/2023

## 2023-03-14 ENCOUNTER — Other Ambulatory Visit: Payer: Self-pay | Admitting: Psychiatry

## 2023-03-14 DIAGNOSIS — F3341 Major depressive disorder, recurrent, in partial remission: Secondary | ICD-10-CM

## 2023-04-18 ENCOUNTER — Ambulatory Visit (INDEPENDENT_AMBULATORY_CARE_PROVIDER_SITE_OTHER): Payer: BC Managed Care – PPO | Admitting: Psychiatry

## 2023-04-18 ENCOUNTER — Encounter: Payer: Self-pay | Admitting: Psychiatry

## 2023-04-18 VITALS — BP 128/84 | HR 78 | Temp 98.7°F | Ht 64.5 in | Wt 300.6 lb

## 2023-04-18 DIAGNOSIS — F3342 Major depressive disorder, recurrent, in full remission: Secondary | ICD-10-CM

## 2023-04-18 DIAGNOSIS — F411 Generalized anxiety disorder: Secondary | ICD-10-CM

## 2023-04-18 DIAGNOSIS — F3341 Major depressive disorder, recurrent, in partial remission: Secondary | ICD-10-CM | POA: Insufficient documentation

## 2023-04-18 DIAGNOSIS — R0683 Snoring: Secondary | ICD-10-CM | POA: Insufficient documentation

## 2023-04-18 DIAGNOSIS — Z634 Disappearance and death of family member: Secondary | ICD-10-CM | POA: Insufficient documentation

## 2023-04-18 MED ORDER — HYDROXYZINE PAMOATE 25 MG PO CAPS: 25.0000 mg | ORAL_CAPSULE | Freq: Every evening | ORAL | 4 refills | Status: DC | PRN

## 2023-04-18 MED ORDER — FLUOXETINE HCL 20 MG PO CAPS
20.0000 mg | ORAL_CAPSULE | Freq: Every day | ORAL | 1 refills | Status: DC
Start: 2023-04-18 — End: 2023-05-17

## 2023-04-18 MED ORDER — FLUOXETINE HCL 40 MG PO CAPS: 40.0000 mg | ORAL_CAPSULE | Freq: Every day | ORAL | 1 refills | Status: DC

## 2023-04-18 NOTE — Progress Notes (Signed)
 BH MD OP Progress Note  04/18/2023 9:00 AM Debra Bender  MRN:  [id number]  Chief Complaint:  Chief Complaint  Patient presents with   Follow-up   Anxiety   Depression   Medication Refill   HPI: Debra Bender is a 38 year old Caucasian female, currently unemployed, lives in [place], married, has a history of MDD, GAD was evaluated in office today.  The patient had recently lost a family member, which had exacerbated her mood symptoms.  Patient is currently on hydroxyzine , Buspar, Prozac  and mirtazapine . The patient reports that the hydroxyzine  has been particularly helpful in managing sleep issues, and she occasionally supplements with melatonin.  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 The patient has not seen her therapist, Debra Bender, since securing the job.  The patient's depression symptoms have improved, and she reports sleeping well with the aid of medication. However, she has been without hydroxyzine  for a week or two due to a refill issue. The patient denies significant sadness, hopelessness, and reports a stable appetite. She denies any suicidal ideation.The patient also reports a history of self-harm through cutting for emotional release.   She has a history of snoring and has never had a sleep study, despite her father having sleep apnea.  She does struggle with fatigue and low energy during the day.  Agreeable to referral for sleep study.  In addition to her own mental health concerns, the patient's eight-year-old son was recently diagnosed with ADHD, which has introduced additional anxiety for the patient. The son began medication over the Christmas break, and the patient reports that he has been doing well on the prescribed medication. The patient's husband, who also has ADHD, has been supportive during this time.  Visit Diagnosis:     ICD-10-CM   1. MDD (major depressive disorder), recurrent, in full remission (HCC)  F33.42 FLUoxetine  (PROZAC ) 40 MG capsule    FLUoxetine  (PROZAC ) 20 MG capsule    2. Generalized anxiety disorder  F41.1 FLUoxetine  (PROZAC ) 40 MG capsule    FLUoxetine  (PROZAC ) 20 MG capsule    hydrOXYzine  (VISTARIL ) 25 MG capsule    3. Bereavement  Z63.4     4. Snoring  R06.83 Ambulatory referral to Pulmonology      Past Psychiatric History: I have reviewed past psychiatric history from progress note on 09/13/2022.  Past Medical History:  Past Medical History:  Diagnosis Date   Anemia 12/2014   Depression    GERD (gastroesophageal reflux disease)    Hypertension    Hypothyroidism    during pregnancy   Obesity affecting pregnancy    PTSD (post-traumatic stress disorder)     Past Surgical History:  Procedure Laterality Date   CESAREAN SECTION N/A 11/20/2018   Procedure: CESAREAN SECTION;  Surgeon: Alben Alma, MD;  Location: [facility];  Service: Obstetrics;  Laterality: N/A;  [id number] [id number]   FRACTURE SURGERY Right 38 years old   MINOR EXCISION OF ORAL LESION Left 07/13/2022   Procedure: EXCISION LESION ORAL VESTIBULE;  Surgeon: Juengel, Paul, MD;  Location: [facility];  Service: ENT;  Laterality: Left;   TONSILLECTOMY Bilateral 01/15/2020   Procedure: TONSILLECTOMY;  Surgeon: Mellody Sprout, MD;  Location: [facility];  Service: ENT;  Laterality: Bilateral;  Latex    Family Psychiatric History: I have reviewed family psychiatric history from progress note on 09/13/2022.  Family History:  Family  History  Problem Relation Age of Onset   Depression Mother    Heart disease Father    Asthma Brother    Alcohol abuse Maternal Uncle    Alcohol abuse Paternal Uncle    Alcohol abuse Paternal Uncle    Diabetes Maternal Grandfather    Emphysema Maternal Grandmother    Alcohol abuse Paternal Grandfather    Heart disease Paternal Grandfather    Cirrhosis  Paternal Grandmother    ADD / ADHD Son     Social History: I have reviewed social history from progress note on 09/13/2022. Social History   Socioeconomic History   Marital status: Married    Spouse name: Blaise Bumps   Number of children: 3   Years of education: Not on file   Highest education level: Some college, no degree  Occupational History   Not on file  Tobacco Use   Smoking status: Former    Current packs/day: 0.00    Types: Cigarettes    Quit date: 07/31/2014    Years since quitting: 8.7   Smokeless tobacco: Never  Vaping Use   Vaping status: Every Day   Substances: Nicotine, Flavoring   Devices: Vaporesso  Substance and Sexual Activity   Alcohol use: No   Drug use: No   Sexual activity: Yes    Birth control/protection: Injection  Other Topics Concern   Not on file  Social History Narrative   Not on file   Social Drivers of Health   Financial Resource Strain: Low Risk  (09/27/2022)   Received [facility], [facility]   Overall Financial Resource Strain (CARDIA)    Difficulty of Paying Living Expenses: Not hard at all  Food Insecurity: No Food Insecurity (09/27/2022)   [facility], [facility]   Hunger Vital Sign    Worried About Running Out of Food in the Last Year: Never true    Ran Out of Food in the Last Year: Never true  Transportation Needs: No Transportation Needs (09/27/2022)   Received from [facility], [facility]   [facility] - Transportation    In the past 12 months, has lack of transportation kept you from medical appointments or from getting medications?: No    Lack of Transportation (Non-Medical): No  Physical Activity: Not on file  Stress: Not on file  Social Connections: Not on file    Allergies:  Allergies  Allergen Reactions   Salmon [Fish Allergy] Anaphylaxis    Tongue, lips and finger tips get tingly    Hydrochlorothiazide Other (See Comments)    Elevated creatinine   Latex Itching and Rash    Condoms, gloves   Tape Rash    "Medical Tape"    Metabolic Disorder Labs: No results found for: "HGBA1C", "MPG" No results found for: "PROLACTIN" No results found for: "CHOL", "TRIG", "HDL", "CHOLHDL", "VLDL", "LDLCALC" Lab Results  Component Value Date   TSH 2.210 09/12/2018   TSH 4.680 (H) 07/04/2018    Therapeutic Level Labs: No results found for: "LITHIUM" No results found for: "VALPROATE" No results found for: "CBMZ"  Current Medications: Current Outpatient Medications  Medication Sig Dispense Refill   diphenhydrAMINE  (BENADRYL ) 25 mg capsule Take 25 mg by mouth every 6 (six) hours as needed.     FLUoxetine  (PROZAC ) 20 MG capsule Take 1 capsule (20 mg total) by mouth daily. Take along with 40 mg daily , total of 60 mg daily. 30 capsule 1  mirtazapine  (REMERON ) 15 MG tablet TAKE 1 TABLET BY MOUTH EVERYDAY AT BEDTIME 90 tablet 0   FLUoxetine  (PROZAC ) 40 MG capsule Take 1 capsule (40 mg total) by mouth daily. Take along with 20 mg daily , total of 60 mg daily 30 capsule 1   hydrOXYzine  (VISTARIL ) 25 MG capsule Take 1 capsule (25 mg total) by mouth at bedtime as needed. Do not take with Benadryl  30 capsule 4   No current facility-administered medications for this visit.     Musculoskeletal: Strength & Muscle Tone: within normal limits Gait & Station: normal Patient leans: N/A  Psychiatric Specialty Exam: Review of Systems  Psychiatric/Behavioral:  The patient is nervous/anxious.     Blood pressure 128/84, pulse 78, temperature 98.7 F (37.1 C), temperature source Temporal, height 5' 4.5" (1.638 m), weight (!) 300 lb 9.6 oz (136.4 kg), SpO2 94%.Body mass index is 50.8 kg/m.  General Appearance: Casual  Eye Contact:  Fair  Speech:  Normal Rate  Volume:  Normal  Mood:  Anxious  Affect:  Congruent  Thought Process:  Goal Directed and Descriptions of Associations: Intact   Orientation:  Full (Time, Place, and Person)  Thought Content: Logical   Suicidal Thoughts:  No  Homicidal Thoughts:  No  Memory:  Immediate;   Fair Recent;   Fair Remote;   Fair  Judgement:  Fair  Insight:  Fair  Psychomotor Activity:  Normal  Concentration:  Concentration: Fair and Attention Span: Fair  Recall:  [employer] of Knowledge: Fair  Language: Fair  Akathisia:  No  Handed:  Right  AIMS (if indicated): not done  Assets:  Communication Skills Desire for Improvement Housing Social Support  ADL's:  Intact  Cognition: WNL  Sleep:   fair on medications   Screenings: GAD-7    Flowsheet Row Office Visit from 04/18/2023 in [facility] Counselor from 01/23/2023 in [facility] Office Visit from 12/20/2022 in [facility] Counselor from 10/23/2022 in [facility] Counselor from 09/18/2022 in [facility]  Total GAD-7 Score 8 8 1 3 2       PHQ2-9    Flowsheet Row Office Visit from 04/18/2023 in [facility] Counselor from 01/23/2023 in [facility] Office Visit from 12/20/2022 in [facility] Counselor from 10/23/2022 in [facility] Counselor from 09/18/2022 in [facility]  PHQ-2 Total Score 1 2 2 1 2   PHQ-9 Total Score -- 6 6 2 6       Flowsheet Row Office Visit from 04/18/2023 in [facility] Counselor from 01/23/2023 in [facility] Office Visit from 12/20/2022 in [facility]  C-SSRS RISK CATEGORY No Risk No Risk No Risk        Assessment and Plan: WESTLEY LEFLER is a 38 year old Caucasian female, married, unemployed, lives in [place], has a history of depression, anxiety, recent grief was evaluated in office today.  Patient continues to have anxiety struggles with snoring/fatigue, discussed assessment and plan as noted below.  Generalized Anxiety Disorder-unstable Ongoing anxiety related to new job and managing son's ADHD. Symptoms managed with hydroxyzine , aiding sleep. No therapy in past two weeks. Discussed increasing fluoxetine  to 60 mg for better control and discontinuing Buspar due to lack of efficacy. Informed about  potential side effects of increased fluoxetine , including increased anxiety, insomnia, and gastrointestinal issues. - Continue hydroxyzine  25 mg at bedtime - Refer to therapist for ongoing anxiety management - Increase fluoxetine  to 60 mg daily - Discontinue Buspar  Major Depressive Disorder, full remission Depression well-managed with current regimen. No significant sadness, hopelessness, or appetite changes. Adequate sleep reported. Discussed potential benefits of increased fluoxetine  for both depression and anxiety. Informed about monitoring for new or worsening symptoms, including suicidality, especially in initial weeks of dosage change. - Continue mirtazapine  15 mg at bedtime - Increase fluoxetine  to 60 mg daily - Monitor for side effects or symptom changes  Snoring-unstable Reports snoring with family history of sleep apnea. No previous sleep study. Discussed potential sleep apnea diagnosis and implications, including daytime fatigue, cardiovascular risks, and CPAP therapy. - Refer to pulmonology to evaluate for potential sleep apnea  Bereavement-improving Currently improving. - Continue psychotherapy with therapist.  Follow-up - Schedule follow-up appointment in eight weeks via video visit - Coordinate with pulmonology for sleep study scheduling.   Collaboration of Care: Collaboration of Care: Referral or  follow-up with counselor/therapist AEB patient encouraged to continue CBT  Patient/Guardian was advised Release of Information must be obtained prior to any record release in order to collaborate their care with an outside provider. Patient/Guardian was advised if they have not already done so to contact the registration department to sign all necessary forms in order for us  to release information regarding their care.   Consent: Patient/Guardian gives verbal consent for treatment and assignment of benefits for services provided during this visit. Patient/Guardian expressed understanding and agreed to proceed.   This note was generated in part or whole with voice recognition software. Voice recognition is usually quite accurate but there are transcription errors that can and very often do occur. I apologize for any typographical errors that were not detected and corrected.    Bristal Steffy, MD 04/18/2023, 9:00 AM

## 2023-04-23 ENCOUNTER — Ambulatory Visit (INDEPENDENT_AMBULATORY_CARE_PROVIDER_SITE_OTHER): Payer: BC Managed Care – PPO | Admitting: Licensed Clinical Social Worker

## 2023-04-23 DIAGNOSIS — Z634 Disappearance and death of family member: Secondary | ICD-10-CM | POA: Diagnosis not present

## 2023-04-23 DIAGNOSIS — F3342 Major depressive disorder, recurrent, in full remission: Secondary | ICD-10-CM

## 2023-04-23 DIAGNOSIS — F411 Generalized anxiety disorder: Secondary | ICD-10-CM

## 2023-04-23 NOTE — Progress Notes (Signed)
   THERAPIST PROGRESS NOTE  Session Time: 8:13-8:53am  Participation Level: Active  Behavioral Response: CasualAlertDepressed  Type of Therapy: Individual Therapy  Treatment Goals addressed: LTG: Increase coping skills to manage depression and improve ability to perform daily activities    Reduce overall frequency, intensity and duration of depression so that daily functioning is not impaired per pt self report 3 out of 5 sessions documented.   ProgressTowards Goals: Progressing  Interventions: CBT, Supportive, and Reframing  Summary: Debra Bender is a 38 y.o. female who presents with depressive symptoms citing anhedonia, tearfulness, flat affect, lack of motivation, low mood. Pt was oriented times 5. Pt was cooperative and engaged. Pt denies SI/HI/AVH.   Patient utilized therapeutic space to process recent life events stating excitement about getting back into work.  Patient continued to explore the reported lack of supports she feels within her marriage.  Patient provided an update on the state of her marriage and recent encounters with her husband.  Patient continues to cite difficulties with communication between the two parties reporting she struggles to feel heard.  Patient reports she remains in the relationship as she has a fear of taking care of the kids all alone.  Patient worked with clinician to explore root of this fear going back to the traumatizing birthing experience of her third child.  Patient reflected on judgment she felt and lack of supports she experienced from extended family.  Patient identified what she felt she needed in that moment and began to challenge unhelpful thought patterns related to her role as a mother.  Suicidal/Homicidal: Nowithout intent/plan  Therapist Response: Cln utilized active and supportive reflection to create environment for patient to process recent life stressors and symptoms.  Clinician assessed for recent stressors, symptoms, and  safety since last session.  Clinician provided supportive feedback as patient processed the state of her marriage.  Reviewed CBT techniques specifically to reframe the negative cognition "I'm a bad mother." Began to explore trauma sxs from difficult childbirth of her 3rd child.  Plan: Return again in 4 weeks.  Diagnosis:MDD (major depressive disorder), recurrent, in full remission (HCC)  Generalized anxiety disorder  Bereavement   Collaboration of Care: AEB psychiatrist can access notes and cln. Will review psychiatrists' notes. Check in with the patient and will see LCSW per availability. Patient agreed with treatment recommendations.  Patient/Guardian was advised Release of Information must be obtained prior to any record release in order to collaborate their care with an outside provider. Patient/Guardian was advised if they have not already done so to contact the registration department to sign all necessary forms in order for [place] to release information regarding their care.   Consent: Patient/Guardian gives verbal consent for treatment and assignment of benefits for services provided during this visit. Patient/Guardian expressed understanding and agreed to proceed.   Dereck Leep, LCSW 04/23/2023

## 2023-05-03 ENCOUNTER — Encounter: Payer: Self-pay | Admitting: Sleep Medicine

## 2023-05-03 ENCOUNTER — Ambulatory Visit: Payer: BC Managed Care – PPO | Admitting: Sleep Medicine

## 2023-05-03 VITALS — BP 132/80 | HR 87 | Temp 97.6°F | Ht 64.5 in | Wt 293.0 lb

## 2023-05-03 DIAGNOSIS — G4733 Obstructive sleep apnea (adult) (pediatric): Secondary | ICD-10-CM

## 2023-05-03 DIAGNOSIS — R0683 Snoring: Secondary | ICD-10-CM | POA: Diagnosis not present

## 2023-05-03 DIAGNOSIS — F5104 Psychophysiologic insomnia: Secondary | ICD-10-CM

## 2023-05-03 DIAGNOSIS — I10 Essential (primary) hypertension: Secondary | ICD-10-CM

## 2023-05-03 NOTE — Patient Instructions (Addendum)
I suspect that OSA is likely present, will complete further evaluation with a home sleep study and follow up to review results.   Discussed the consequences of untreated sleep apnea which include HTN, stroke, and heart failure. Encouraged proper weight management. Discussed driving precautions and its relationship with hypersomnolence. Discussed operating dangerous equipment and its relationship with hypersomnolence. Discussed sleep hygiene, and benefits of a fixed sleep waked time. The importance of getting eight or more hours of sleep discussed with patient. Discussed limiting the use of the computer and television before bedtime. Decrease naps during the day, so night time sleep will become enhanced. Limit caffeine, and sleep deprivation.

## 2023-05-03 NOTE — Progress Notes (Signed)
Name:Debra Bender MRN: [id number] DOB: [birth year]   CHIEF COMPLAINT:  EXCESSIVE DAYTIME SLEEPINESS   HISTORY OF PRESENT ILLNESS:  Debra Bender is a 38 yowm w/ a h/o morbid obesity, depression and anxiety who present for c/o loud snoring and excessive daytime sleepiness which has been present for several years. Denies nocturnal awakenings. Denies any significant weight changes. Reports occasional headaches and restless sleep. Reports a family history of sleep apnea. Denies drowsy driving. Drinks 3-4 caffeinated beverages daily. 1-2 sodas daily, occasional alcohol use, vapes throughout the day, denies illicit drug use.   Bedtime 8 pm Sleep onset 2 hours Rise time 4-5 am    Discussed the consequences of untreated sleep apnea which include HTN, stroke, and heart failure. Encouraged proper weight management. Discussed driving precautions and its relationship with hypersomnolence. Discussed operating dangerous equipment and its relationship with hypersomnolence. Discussed sleep hygiene, and benefits of a fixed sleep waked time. The importance of getting eight or more hours of sleep discussed with patient. Discussed limiting the use of the computer and television before bedtime. Decrease naps during the day, so night time sleep will become enhanced. Limit caffeine, and sleep deprivation.    EPWORTH SLEEP SCORE 6   PAST MEDICAL HISTORY :   has a past medical history of Anemia (12/2014), Depression, GERD (gastroesophageal reflux disease), Hypertension, Hypothyroidism, Obesity affecting pregnancy, and PTSD (post-traumatic stress disorder).  has a past surgical history that includes Fracture surgery (Right, 38 years old); Cesarean section (N/A, 11/20/2018); Tonsillectomy (Bilateral, 01/15/2020); and Minor excision of oral lesion (Left, 07/13/2022). Prior to Admission medications   Medication Sig Start Date End Date Taking? Authorizing Provider  diphenhydrAMINE (BENADRYL) 25 mg capsule Take  25 mg by mouth every 6 (six) hours as needed.   Yes [provider]  FLUoxetine (PROZAC) 20 MG capsule Take 1 capsule (20 mg total) by mouth daily. Take along with 40 mg daily , total of 60 mg daily. 04/18/23  Yes Jomarie Longs, MD  FLUoxetine (PROZAC) 40 MG capsule Take 1 capsule (40 mg total) by mouth daily. Take along with 20 mg daily , total of 60 mg daily 04/18/23  Yes Eappen, Levin Bacon, MD  hydrOXYzine (VISTARIL) 25 MG capsule Take 1 capsule (25 mg total) by mouth at bedtime as needed. Do not take with Benadryl 04/18/23  Yes Eappen, Levin Bacon, MD  mirtazapine (REMERON) 15 MG tablet TAKE 1 TABLET BY MOUTH EVERYDAY AT BEDTIME 03/16/23  Yes Eappen, Levin Bacon, MD   Allergies  Allergen Reactions   Salmon [Fish Allergy] Anaphylaxis    Tongue, lips and finger tips get tingly   Hydrochlorothiazide Other (See Comments)    Elevated creatinine   Latex Itching and Rash    Condoms, gloves   Tape Rash    "Medical Tape"    FAMILY HISTORY:  family history includes ADD / ADHD in her son; Alcohol abuse in her maternal uncle, paternal grandfather, paternal uncle, and paternal uncle; Asthma in her brother; Cirrhosis in her paternal grandmother; Depression in her mother; Diabetes in her maternal grandfather; Emphysema in her maternal grandmother; Heart disease in her father and paternal grandfather. SOCIAL HISTORY:  reports that she quit smoking about 8 years ago. Her smoking use included cigarettes. She has never used smokeless tobacco. She reports that she does not drink alcohol and does not use drugs.   Review of Systems:  Gen:  Denies  fever, sweats, chills weight loss  HEENT: Denies blurred vision, double vision, ear pain, eye pain, hearing  loss, nose bleeds, sore throat Cardiac:  No dizziness, chest pain or heaviness, chest tightness,edema, No JVD Resp:   No cough, -sputum production, -shortness of breath,-wheezing, -hemoptysis,  Gi: Denies swallowing difficulty, stomach pain, nausea or  vomiting, diarrhea, constipation, bowel incontinence Gu:  Denies bladder incontinence, burning urine Ext:   Denies Joint pain, stiffness or swelling Skin: Denies  skin rash, easy bruising or bleeding or hives Endoc:  Denies polyuria, polydipsia , polyphagia or weight change Psych:   Denies depression, insomnia or hallucinations  Other:  All other systems negative   VITAL SIGNS: BP 132/80 (BP Location: Right Arm, Patient Position: Sitting, Cuff Size: Normal)   Pulse 87   Temp 97.6 F (36.4 C) (Temporal)   Ht 5' 4.5" (1.638 m)   Wt 293 lb (132.9 kg)   SpO2 97%   BMI 49.52 kg/m     Physical Examination:   General Appearance: No distress  EYES PERRLA, EOM intact.   Pulmonary: normal breath sounds, No wheezing.  CardiovascularNormal S1,S2.  No m/r/g.   Abdomen: Benign, Soft, non-tender. Skin:   warm, no rashes, no ecchymosis  Extremities: normal, no cyanosis, clubbing. Neuro:without focal findings,  speech normal  PSYCHIATRIC: Mood, affect within normal limits.   ASSESSMENT AND PLAN SYNOPSIS  Patient with symptoms of snoring and excessive daytime sleepiness with probable underlying diagnosis of obstructive sleep apnea.   OSA Will complete a home sleep study for further evaluation of obstructive sleep apnea.   HTN Stable, on current management. Following with PCP.    MEDICATION ADJUSTMENTS/LABS AND TESTS ORDERED: Recommend Sleep Study   Patient  satisfied with Plan of action and management. All questions answered  Follow up to review HST results and treatment plan.   I spent a total of  34 minutes reviewing chart data, face-to-face evaluation with the patient, counseling and coordination of care as detailed above.    Tempie Hoist, M.D.  Sleep Medicine [facility]

## 2023-05-16 ENCOUNTER — Other Ambulatory Visit: Payer: Self-pay | Admitting: Psychiatry

## 2023-05-16 ENCOUNTER — Ambulatory Visit (INDEPENDENT_AMBULATORY_CARE_PROVIDER_SITE_OTHER): Payer: Self-pay | Admitting: Licensed Clinical Social Worker

## 2023-05-16 DIAGNOSIS — F411 Generalized anxiety disorder: Secondary | ICD-10-CM

## 2023-05-16 DIAGNOSIS — F3342 Major depressive disorder, recurrent, in full remission: Secondary | ICD-10-CM

## 2023-05-16 DIAGNOSIS — F439 Reaction to severe stress, unspecified: Secondary | ICD-10-CM
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
## 2023-05-31 ENCOUNTER — Ambulatory Visit: Payer: BC Managed Care – PPO | Admitting: Sleep Medicine

## 2023-06-08 ENCOUNTER — Encounter

## 2023-06-13 ENCOUNTER — Ambulatory Visit (INDEPENDENT_AMBULATORY_CARE_PROVIDER_SITE_OTHER): Payer: BC Managed Care – PPO | Admitting: Licensed Clinical Social Worker

## 2023-06-13 DIAGNOSIS — F3342 Major depressive disorder, recurrent, in full remission: Secondary | ICD-10-CM

## 2023-06-13 DIAGNOSIS — F439 Reaction to severe stress, unspecified: Secondary | ICD-10-CM

## 2023-06-13 DIAGNOSIS — F411 Generalized anxiety disorder: Secondary | ICD-10-CM | POA: Diagnosis not present

## 2023-06-13 NOTE — Progress Notes (Signed)
 THERAPIST PROGRESS NOTE  Session Time: 4:02-4:41pm  Participation Level: Active  Behavioral Response: CasualAlertEuthymic  Type of Therapy: Individual Therapy  Treatment Goals addressed:    Template: Depression (OP)         Problem: OP Depression     Dates: Start:  03/28/22       Description: Pt stated that she wants to work on managing her depression     Disciplines: Interdisciplinary, Counselor, PROVIDER        Goal: LTG: Increase coping skills to manage depression and improve ability to perform daily activities     Dates: Start:  03/28/22    Expected End:  06/25/23       Disciplines: Interdisciplinary, PROVIDER               Goal: Depression      Dates: Start:  03/28/22    Expected End:  06/25/23       Description: Reduce overall frequency, intensity and duration of depression so that daily functioning is not impaired per pt self report 3 out of 5 sessions documented.      Disciplines: Interdisciplinary, PROVIDER                 Goal: Depression      Dates: Start:  03/28/22    Expected End:  06/25/23       Description: Develop healthy interpersonal relationships to help prevent relapse of depression symptoms per pt report 3 out of 5 sessions.      Disciplines: Counselor                 Goal: STG - Misc 1     Dates: Start:  01/23/23    Expected End:  06/25/23       Description: Pt will learn about communication styles: passive, assertive, and aggressive.     Disciplines: Counselor                         Template: PTSD (OP)         Problem: BH CCP Acute or Chronic Trauma Reaction     Dates: Start:  03/28/22       Description: Pt stated that she wants to work on processing her childhood trauma in therapy     Disciplines: Interdisciplinary, PROVIDER        Goal: Trauma      Dates: Start:  03/28/22    Expected End:  06/25/23       Description: Pt will explore coping skills and learn coping and grounding skills to reduce symptoms of PTSD and prepare to  handle future stressful situations as evidenced by implementing coping skills per pt self-report 3 out of 5 documented sessions.      Disciplines: Interdisciplinary, PROVIDER                                                             Goal: STG: Debra Bender will identify internal and external stimuli that trigger PTSD symptoms     Dates: Start:  03/28/22    Expected End:  06/25/23       Disciplines: Interdisciplinary, PROVIDER  No Associated Template         Problem: Self Esteem:     Dates: Start:  03/28/22       Description:      Disciplines: Counselor        Goal: Indentifies positive aspects of self     Dates: Start:  03/28/22    Expected End:  06/25/23       Description:      Disciplines: Counselor                 Goal: Self-Esteem      Dates: Start:  03/28/22    Expected End:  06/25/23       Description: Will Work with patient to decrease the frequency of negative self-descriptive statements and increase the frequency of positive self- descriptive statements using CBT/DBT/REBT techniques per patient self report 3 out of 5 documented sessions.      Disciplines: Counselor                 Goal: LTG - Misc 1     Dates: Start:  01/23/23    Expected End:  06/25/23       Description: Pt reports she wants to learn how to "speak up for myself."         ProgressTowards Goals: Progressing  Interventions: Supportive and Reframing EMDR  Summary: Debra Bender is a 38 y.o. female who presents with depressive symptoms citing anhedonia, tearfulness, flat affect, lack of motivation, low mood. Pt was oriented times 5. Pt was cooperative and engaged. Pt denies SI/HI/AVH.   Patient completed PCL-5 screener to assess for trauma symptoms.  Patient began EMDR treatment after reflecting on how her previous trauma history has impacted her marriage and ability to feel safe.  Clinician educated patient on various breathing techniques such as  acupressure breathing, belly breathing, 7-11 breathing.  Patient identified acupressure breathing to be the most effective for her citing she will practice acupressure breathing daily until her next session.  Patient identified the breathing techniques assisted in helping her feel grounded and safe.  Suicidal/Homicidal: Nowithout intent/plan  Therapist Response: Cln utilized active and supportive reflection to create environment for patient to process recent life stressors and symptoms. Clinician assessed for recent stressors, symptoms, and safety since last session. Began EMDR with patient. Clinician educated patient on various breathing techniques such as acupressure breathing, belly breathing, 7-11 breathing.  Plan: Return again in 2 weeks.  Diagnosis: MDD (major depressive disorder), recurrent, in full remission (HCC)  Trauma and stressor-related disorder  Generalized anxiety disorder   Collaboration of Care: AEB psychiatrist can access notes and cln. Will review psychiatrists' notes. Check in with the patient and will see LCSW per availability. Patient agreed with treatment recommendations.   Patient/Guardian was advised Release of Information must be obtained prior to any record release in order to collaborate their care with an outside provider. Patient/Guardian was advised if they have not already done so to contact the registration department to sign all necessary forms in order for [place] to release information regarding their care.   Consent: Patient/Guardian gives verbal consent for treatment and assignment of benefits for services provided during this visit. Patient/Guardian expressed understanding and agreed to proceed.   Dereck Leep, LCSW 06/13/2023

## 2023-06-18 ENCOUNTER — Telehealth (INDEPENDENT_AMBULATORY_CARE_PROVIDER_SITE_OTHER): Payer: Self-pay | Admitting: Psychiatry

## 2023-06-18 DIAGNOSIS — F3342 Major depressive disorder, recurrent, in full remission: Secondary | ICD-10-CM

## 2023-06-18 NOTE — Progress Notes (Signed)
 No response to call or text or video invite

## 2023-06-19 ENCOUNTER — Encounter

## 2023-06-19 DIAGNOSIS — G4733 Obstructive sleep apnea (adult) (pediatric): Secondary | ICD-10-CM

## 2023-06-19 DIAGNOSIS — R069 Unspecified abnormalities of breathing: Secondary | ICD-10-CM | POA: Diagnosis not present

## 2023-06-20 ENCOUNTER — Telehealth: Payer: Self-pay

## 2023-06-20 DIAGNOSIS — G4733 Obstructive sleep apnea (adult) (pediatric): Secondary | ICD-10-CM

## 2023-06-20 NOTE — Telephone Encounter (Signed)
-----   Message from [facility] D REDDY sent at 06/20/2023  1:09 PM EDT ----- Regarding: HST results Please notify patient that HST revealed moderate OSA, I recommend starting on APAP therapy at 4-16 cm H2O, EPR 3 with the AirTouch N30i nasal mask. Please also schedule 3 month follow up visit. Thanks ----- Message ----- From: Lilian Kapur Sent: 06/20/2023   7:31 AM EDT To: Alanda Slim, MD

## 2023-06-21 ENCOUNTER — Telehealth: Payer: Self-pay

## 2023-06-21 DIAGNOSIS — G4733 Obstructive sleep apnea (adult) (pediatric): Secondary | ICD-10-CM

## 2023-06-21 NOTE — Telephone Encounter (Signed)
-----   Message from [facility] D REDDY sent at 06/20/2023  1:09 PM EDT ----- Regarding: HST results Please notify patient that HST revealed moderate OSA, I recommend starting on APAP therapy at 4-16 cm H2O, EPR 3 with the AirTouch N30i nasal mask. Please also schedule 3 month follow up visit. Thanks ----- Message ----- From: Lilian Kapur Sent: 06/20/2023   7:31 AM EDT To: Alanda Slim, MD

## 2023-06-27 ENCOUNTER — Ambulatory Visit (INDEPENDENT_AMBULATORY_CARE_PROVIDER_SITE_OTHER): Payer: BC Managed Care – PPO | Admitting: Licensed Clinical Social Worker

## 2023-06-27 DIAGNOSIS — F439 Reaction to severe stress, unspecified: Secondary | ICD-10-CM

## 2023-06-27 DIAGNOSIS — F3342 Major depressive disorder, recurrent, in full remission: Secondary | ICD-10-CM | POA: Diagnosis not present

## 2023-06-27 DIAGNOSIS — F411 Generalized anxiety disorder: Secondary | ICD-10-CM
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
## 2023-07-11 ENCOUNTER — Ambulatory Visit (INDEPENDENT_AMBULATORY_CARE_PROVIDER_SITE_OTHER): Payer: Self-pay | Admitting: Licensed Clinical Social Worker

## 2023-07-11 DIAGNOSIS — F411 Generalized anxiety disorder: Secondary | ICD-10-CM

## 2023-07-11 DIAGNOSIS — F3342 Major depressive disorder, recurrent, in full remission: Secondary | ICD-10-CM | POA: Diagnosis not present

## 2023-07-11 DIAGNOSIS — F439 Reaction to severe stress, unspecified: Secondary | ICD-10-CM | POA: Diagnosis not present

## 2023-07-11 NOTE — Progress Notes (Signed)
 THERAPIST PROGRESS NOTE  Session Time: 4:02-4:50pm  Participation Level: Active  Behavioral Response: CasualAlertEuthymic  Type of Therapy: Individual Therapy  Treatment Goals addressed:  Template: Depression (OP)                                              Problem: OP Depression          Dates: Start:  03/28/22            Description: Pt stated that she wants to work on managing her depression          Disciplines: Interdisciplinary, Counselor, PROVIDER                             Goal: LTG: Increase coping skills to manage depression and improve ability to perform daily activities       Dates: Start:  03/28/22    Expected End:  06/25/23         Disciplines: Interdisciplinary, PROVIDER                                      Goal: Depression        Dates: Start:  03/28/22    Expected End:  06/25/23         Description: Reduce overall frequency, intensity and duration of depression so that daily functioning is not impaired per pt self report 3 out of 5 sessions documented.        Disciplines: Interdisciplinary, PROVIDER                                      Goal: Depression        Dates: Start:  03/28/22    Expected End:  06/25/23         Description: Develop healthy interpersonal relationships to help prevent relapse of depression symptoms per pt report 3 out of 5 sessions.        Disciplines: Counselor                                  Goal: STG - Misc 1     Dates: Start:  01/23/23    Expected End:  06/25/23       Description: Pt will learn about communication styles: passive, assertive, and aggressive.     Disciplines: Counselor                                               Template: PTSD (OP)                                               Problem: BH CCP Acute or Chronic Trauma Reaction           Dates: Start:  03/28/22             Description: Pt  stated that she wants to work on processing her childhood trauma in therapy           Disciplines:  Interdisciplinary, PROVIDER                                       Goal: Trauma         Dates: Start:  03/28/22    Expected End:  06/25/23          Description: Pt will explore coping skills and learn coping and grounding skills to reduce symptoms of PTSD and prepare to handle future stressful situations as evidenced by implementing coping skills per pt self-report 3 out of 5 documented sessions.         Disciplines: Interdisciplinary, PROVIDER                                                                                                                     Goal: STG: [name] will identify internal and external stimuli that trigger PTSD symptoms       Dates: Start:  03/28/22    Expected End:  06/25/23         Disciplines: Interdisciplinary, PROVIDER                                                                         ProgressTowards Goals: Progressing  Interventions: Supportive and Other: EMDR  Summary: Debra Bender is a 38 y.o. female who presents with depressive symptoms citing anhedonia, tearfulness, flat affect, lack of motivation, low mood. Pt was oriented times 5. Pt was cooperative and engaged. Pt denies SI/HI/AVH.   Clinician and patient continued EMDR by constructing patient's safe place.  Patient reflected on successful use of container since her last session but continues to report breathing techniques to be the most effective in managing her symptoms.  Clinician and patient constructed her target sequence plan identifying life events that have further supported the thought I am a failure.  Patient identifies she has often felt she was a failure as a mom, wife, and with her socioeconomic status.  Clinician and patient worked to identify a positive adaptive belief reporting she would choose to believe "you are good enough."  Suicidal/Homicidal: Nowithout intent/plan  Therapist Response: Cln utilized active and supportive reflection to create  environment for patient to process recent life stressors and symptoms. Clinician assessed for recent stressors, symptoms, and safety since last session.  Assessed for negative cognitions related to patient's trauma history.  Continued EMDR by constructing patient's safe place.   Plan: Return again in 2 weeks.  Diagnosis:MDD (major depressive disorder), recurrent, in full remission (HCC)  Trauma and stressor-related  disorder  Generalized anxiety disorder   Collaboration of Care: AEB psychiatrist can access notes and cln. Will review psychiatrists' notes. Check in with the patient and will see LCSW per availability. Patient agreed with treatment recommendations.   Patient/Guardian was advised Release of Information must be obtained prior to any record release in order to collaborate their care with an outside provider. Patient/Guardian was advised if they have not already done so to contact the registration department to sign all necessary forms in order for [place] to release information regarding their care.   Consent: Patient/Guardian gives verbal consent for treatment and assignment of benefits for services provided during this visit. Patient/Guardian expressed understanding and agreed to proceed.   Dereck Leep, LCSW 07/11/2023

## 2023-07-26 ENCOUNTER — Ambulatory Visit (INDEPENDENT_AMBULATORY_CARE_PROVIDER_SITE_OTHER): Admitting: Licensed Clinical Social Worker

## 2023-07-26 DIAGNOSIS — F439 Reaction to severe stress, unspecified: Secondary | ICD-10-CM | POA: Diagnosis not present

## 2023-07-26 DIAGNOSIS — F3342 Major depressive disorder, recurrent, in full remission: Secondary | ICD-10-CM

## 2023-07-26 NOTE — Progress Notes (Signed)
 THERAPIST PROGRESS NOTE  Session Time: 3-3:50pm  Participation Level: Active  Behavioral Response: CasualAlertEuthymic  Type of Therapy: Individual Therapy  Treatment Goals addressed:  Template: Depression (OP)                                                                      Problem: OP Depression            Dates: Start:  03/28/22              Description: Pt stated that she wants to work on managing her depression            Disciplines: Interdisciplinary, Counselor, PROVIDER                               Goal: LTG: Increase coping skills to manage depression and improve ability to perform daily activities       Dates: Start:  03/28/22    Expected End:  06/25/23         Disciplines: Interdisciplinary, PROVIDER                                       Goal: Depression        Dates: Start:  03/28/22    Expected End:  06/25/23         Description: Reduce overall frequency, intensity and duration of depression so that daily functioning is not impaired per pt self report 3 out of 5 sessions documented.        Disciplines: Interdisciplinary, PROVIDER                                       Goal: Depression        Dates: Start:  03/28/22    Expected End:  06/25/23         Description: Develop healthy interpersonal relationships to help prevent relapse of depression symptoms per pt report 3 out of 5 sessions.        Disciplines: Counselor                                   Goal: STG - Misc 1     Dates: Start:  01/23/23    Expected End:  06/25/23       Description: Pt will learn about communication styles: passive, assertive, and aggressive.     Disciplines: Counselor                                                        Template: PTSD (OP)  Problem: BH CCP Acute or Chronic Trauma Reaction              Dates: Start:  03/28/22                Description: Pt stated that she wants to  work on processing her childhood trauma in therapy              Disciplines: Interdisciplinary, PROVIDER                                                  Goal: Trauma          Dates: Start:  03/28/22    Expected End:  06/25/23           Description: Pt will explore coping skills and learn coping and grounding skills to reduce symptoms of PTSD and prepare to handle future stressful situations as evidenced by implementing coping skills per pt self-report 3 out of 5 documented sessions.          Disciplines: Interdisciplinary, PROVIDER                                                                                                             Goal: STG: Yamilet will identify internal and external stimuli that trigger PTSD symptoms       Dates: Start:  03/28/22    Expected End:  06/25/23         Disciplines: Interdisciplinary, PROVIDER                               ProgressTowards Goals: Progressing  Interventions: CBT, Supportive, Reframing, and Other: EMDR  Summary:  Debra Bender is a 38 y.o. female who presents with depressive symptoms citing anhedonia, tearfulness, flat affect, lack of motivation, low mood. Pt was oriented times 5. Pt was cooperative and engaged. Pt denies SI/HI/AVH.   The patient used a therapeutic session to process recent stressors related to the health and wellbeing of family members. She identified some coping strategies but reported a lack of time to engage in these coping skills. The clinician emphasized the importance of self-care and assisted the patient in finding ways to take time for herself in the coming days.  The patient continued with EMDR therapy by starting to process her [place] memory. She was able to reduce her subjective units of distress from a score of 5 to 3. However, she struggled with reframing the misplaced guilt she experienced as a child, as well as guilt connected to her current parenting style. The clinician worked  with the patient to acknowledge methods for practicing self-compassion as she strives to break generational trauma. 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  Suicidal/Homicidal: Nowithout intent/plan  Therapist Response:  Cln utilized active and supportive reflection to create environment for patient to process recent life stressors and symptoms. Clinician assessed for recent stressors, symptoms, and safety since last session.  Assessed for negative cognitions related to patient's trauma history.  Continued EMDR by constructing patient's safe place.   Plan: Return again in 2 weeks.  Diagnosis: MDD (major depressive disorder), recurrent, in full remission (HCC)  Trauma and stressor-related disorder   Collaboration of Care: AEB psychiatrist can access notes and cln. Will review psychiatrists' notes. Check in with the patient and will see LCSW per availability. Patient agreed with treatment recommendations.   Patient/Guardian was advised Release of Information must be obtained prior to any record release in order to collaborate their care with an outside provider. Patient/Guardian was advised if they have not already done so to contact the registration department to sign all necessary forms in order for us  to release information regarding their care.   Consent: Patient/Guardian gives verbal consent for treatment and assignment of benefits for services provided during this visit. Patient/Guardian expressed understanding and agreed to proceed.   Marvin Slot, LCSW 07/26/2023

## 2023-08-15 ENCOUNTER — Ambulatory Visit (INDEPENDENT_AMBULATORY_CARE_PROVIDER_SITE_OTHER): Admitting: Licensed Clinical Social Worker

## 2023-08-15 DIAGNOSIS — F439 Reaction to severe stress, unspecified: Secondary | ICD-10-CM | POA: Diagnosis not present

## 2023-08-15 DIAGNOSIS — F3342 Major depressive disorder, recurrent, in full remission: Secondary | ICD-10-CM | POA: Diagnosis not present

## 2023-08-15 NOTE — Progress Notes (Unsigned)
 THERAPIST PROGRESS NOTE  Session Time: 4-4:55pm  Participation Level: Active  Behavioral Response: CasualAlertEuthymic  Type of Therapy: Individual Therapy  Treatment Goals addressed:  Problem: OP Depression              Dates: Start:  03/28/22                Description: Pt stated that she wants to work on managing her depression              Disciplines: Interdisciplinary, Counselor, PROVIDER                                 Goal: LTG: Increase coping skills to manage depression and improve ability to perform daily activities       Dates: Start:  03/28/22    Expected End:  06/25/23         Disciplines: Interdisciplinary, PROVIDER                                        Goal: Depression        Dates: Start:  03/28/22    Expected End:  06/25/23         Description: Reduce overall frequency, intensity and duration of depression so that daily functioning is not impaired per pt self report 3 out of 5 sessions documented.        Disciplines: Interdisciplinary, PROVIDER                                        Goal: Depression        Dates: Start:  03/28/22    Expected End:  06/25/23         Description: Develop healthy interpersonal relationships to help prevent relapse of depression symptoms per pt report 3 out of 5 sessions.        Disciplines: Counselor                                    Goal: STG - Misc 1     Dates: Start:  01/23/23    Expected End:  06/25/23       Description: Pt will learn about communication styles: passive, assertive, and aggressive.     Disciplines: Counselor                                                               Template: PTSD (OP)  Problem: BH CCP Acute or Chronic Trauma Reaction               Dates: Start:  03/28/22                 Description: Pt stated that she wants to work on processing her childhood trauma in therapy                Disciplines: Interdisciplinary, PROVIDER                                                   Goal: Trauma          Dates: Start:  03/28/22    Expected End:  06/25/23           Description: Pt will explore coping skills and learn coping and grounding skills to reduce symptoms of PTSD and prepare to handle future stressful situations as evidenced by implementing coping skills per pt self-report 3 out of 5 documented sessions.          Disciplines: Interdisciplinary, PROVIDER                                                                                                                       Goal: STG: Garry will identify internal and external stimuli that trigger PTSD symptoms       Dates: Start:  03/28/22    Expected End:  06/25/23         Disciplines: Interdisciplinary, PROVIDER                    ProgressTowards Goals: Progressing  Interventions: Supportive, Reframing, and Other: EMDR  Summary:  [name] is a 38 y.o. female who presents with depressive symptoms citing anhedonia, tearfulness, flat affect, lack of motivation, low mood. Pt was oriented times 5. Pt was cooperative and engaged. Pt denies SI/HI/AVH.   The patient continued with EMDR therapy, focusing on her target sequence plan. She successfully desensitized a previous traumatic memory from grade school, reporting a subjective unit of distress score of 0. The patient embraced the belief, "I am worth it," and reflected on her practice of self-compassion as she works to process and heal from generational trauma. The clinician supported her in shifting her perspective to use her past experiences to empathize with her children, recognizing her struggles as a child within their experiences. They also discussed ways to boost her children's self-esteem, which will, in turn, improve her relationship with herself.  Suicidal/Homicidal: Nowithout intent/plan  Therapist Response: Cln utilized active and  supportive reflection to create environment for patient to process recent life stressors and symptoms. Clinician assessed for recent stressors, symptoms, and safety since last session. Assessed for negative cognitions related to patient's trauma history. Continued EMDR by constructing patient's safe place.   Plan: Return again in 2 weeks.  Diagnosis: MDD (major depressive disorder), recurrent, in full remission (HCC)  Trauma and stressor-related disorder   Collaboration of Care: AEB psychiatrist can access notes and cln. Will review psychiatrists' notes. Check in with the patient and will see LCSW per availability. Patient agreed with treatment recommendations.   Patient/Guardian was advised Release of Information must be obtained prior to any record release in order to collaborate their care with an outside provider. Patient/Guardian was advised if they have not already done so to contact the registration department to sign all necessary forms in order for us  to release information regarding their care.   Consent: Patient/Guardian gives verbal consent for treatment and assignment of benefits for services provided during this visit. Patient/Guardian expressed understanding and agreed to proceed.   Marvin Slot, LCSW 08/15/2023

## 2023-08-29 ENCOUNTER — Ambulatory Visit (INDEPENDENT_AMBULATORY_CARE_PROVIDER_SITE_OTHER): Admitting: Licensed Clinical Social Worker

## 2023-08-29 DIAGNOSIS — F439 Reaction to severe stress, unspecified: Secondary | ICD-10-CM | POA: Diagnosis not present

## 2023-08-29 DIAGNOSIS — F3342 Major depressive disorder, recurrent, in full remission: Secondary | ICD-10-CM

## 2023-08-29 NOTE — Progress Notes (Signed)
 THERAPIST PROGRESS NOTE  Session Time: 4:05pm-5pm  Participation Level: Active  Behavioral Response: CasualAlertEuthymic  Type of Therapy: Individual Therapy  Treatment Goals addressed:  Problem: OP Depression                Dates: Start:  03/28/22                  Description: Pt stated that she wants to work on managing her depression                Disciplines: Interdisciplinary, Counselor, PROVIDER                                   Goal: LTG: Increase coping skills to manage depression and improve ability to perform daily activities       Dates: Start:  03/28/22    Expected End:  06/25/23         Disciplines: Interdisciplinary, PROVIDER                                        Goal: Depression        Dates: Start:  03/28/22    Expected End:  06/25/23         Description: Reduce overall frequency, intensity and duration of depression so that daily functioning is not impaired per pt self report 3 out of 5 sessions documented.        Disciplines: Interdisciplinary, PROVIDER                                        Goal: Depression        Dates: Start:  03/28/22    Expected End:  06/25/23         Description: Develop healthy interpersonal relationships to help prevent relapse of depression symptoms per pt report 3 out of 5 sessions.        Disciplines: Counselor                                    Goal: STG - Misc 1     Dates: Start:  01/23/23    Expected End:  06/25/23       Description: Pt will learn about communication styles: passive, assertive, and aggressive.     Disciplines: Counselor                                                                      Template: PTSD (OP)  Problem: BH CCP Acute or Chronic Trauma Reaction                Dates: Start:  03/28/22                  Description: Pt stated that she wants to work on  processing her childhood trauma in therapy                Disciplines: Interdisciplinary, PROVIDER                                                    Goal: Trauma          Dates: Start:  03/28/22    Expected End:  06/25/23           Description: Pt will explore coping skills and learn coping and grounding skills to reduce symptoms of PTSD and prepare to handle future stressful situations as evidenced by implementing coping skills per pt self-report 3 out of 5 documented sessions.          Disciplines: Interdisciplinary, PROVIDER                                                                                                                       Goal: STG: Michala will identify internal and external stimuli that trigger PTSD symptoms       Dates: Start:  03/28/22    Expected End:  06/25/23         Disciplines: Interdisciplinary, PROVIDER                    ProgressTowards Goals: Progressing  Interventions: Supportive, Reframing, and Other: EMDR   Summary: Debra Bender is a 38 y.o. female who presents with depressive symptoms citing anhedonia, tearfulness, flat affect, lack of motivation, low mood. Pt was oriented times 5. Pt was cooperative and engaged. Pt denies SI/HI/AVH.    The patient continued with EMDR therapy, focusing on her target sequence plan. She identified recent triggers that increased her Subjective Unit of Distress (SUD) from the previous session. The patient successfully reprocessed this memory, leading to the desensitization of a previous traumatic experience from grade school, and reported a subjective unit of distress score of 2.   The clinician supported her in shifting her perspective to use her past experiences to empathize with her children, recognizing her struggles as a child within their experiences. Additionally, the clinician identified the support she has now compared to when she was a child. The clinician acknowledged the efforts she is  making to advocate for her child, aiming to control what she can in order to help him have a healthier experience at school.  Suicidal/Homicidal: Nowithout intent/plan  Therapist Response: Cln utilized active and supportive reflection to create environment for patient to process recent life stressors and symptoms. Clinician  assessed for recent stressors, symptoms, and safety since last session.  Continued EMDR reprocessing patient's targeted sequence plan related to the negative belief "I am a failure."  Plan: Return again in 2 weeks.  Diagnosis: MDD (major depressive disorder), recurrent, in full remission (HCC)  Trauma and stressor-related disorder   Collaboration of Care: AEB psychiatrist can access notes and cln. Will review psychiatrists' notes. Check in with the patient and will see LCSW per availability. Patient agreed with treatment recommendations.   Patient/Guardian was advised Release of Information must be obtained prior to any record release in order to collaborate their care with an outside provider. Patient/Guardian was advised if they have not already done so to contact the registration department to sign all necessary forms in order for us  to release information regarding their care.   Consent: Patient/Guardian gives verbal consent for treatment and assignment of benefits for services provided during this visit. Patient/Guardian expressed understanding and agreed to proceed.   Marvin Slot, LCSW 08/29/2023

## 2023-09-12 ENCOUNTER — Ambulatory Visit (INDEPENDENT_AMBULATORY_CARE_PROVIDER_SITE_OTHER): Admitting: Licensed Clinical Social Worker

## 2023-09-12 DIAGNOSIS — F3342 Major depressive disorder, recurrent, in full remission: Secondary | ICD-10-CM

## 2023-09-12 DIAGNOSIS — F439 Reaction to severe stress, unspecified: Secondary | ICD-10-CM

## 2023-09-12 NOTE — Progress Notes (Signed)
 THERAPIST PROGRESS NOTE  Session Time: 4-4:58pm  Participation Level: Active  Behavioral Response: CasualAlertEuthymic  Type of Therapy: Individual Therapy  Treatment Goals addressed:  Template: Depression (OP)         Problem: OP Depression     Dates: Start:  02/07/23       Disciplines: Interdisciplinary, PROVIDER        Goal: LTG: Reduce frequency, intensity, and duration of depression symptoms so that daily functioning is improved     Dates: Start:  02/07/23    Expected End:  07/06/23       Disciplines: Interdisciplinary, PROVIDER         Outcomes     Date/Time User Outcome    09/12/23 1601 Joannie Medine P Progressing    02/07/23 1153 Sherif Millspaugh P Initial            Goal note from Appointment 09/12/2023 by Almetta Jacquet B, LCSW     09/12/23: 55% progress; Reports she does not become as irritable as she used to. Identifies she able to engage in coping skills and continue with her day.                     Goal: STG: Tiziana will identify cognitive patterns and beliefs that support depression     Dates: Start:  02/07/23    Expected End:  07/06/23       Disciplines: Interdisciplinary, PROVIDER         Outcomes     Date/Time User Outcome    09/12/23 1601 Sagal Gayton P Progressing    02/07/23 1153 Moustapha Tooker P Initial            Goal note from Appointment 09/12/2023 by Almetta Jacquet B, LCSW     09/12/23: 50% progress; Shares she has made progress on recognizing unhelpful thought patterns outside oft therapy stating I don't project them on everyone around me like I used to.                     Intervention: Work with Diego Foy to identify the major components of a recent episode of depression: physical symptoms, major thoughts and images, and major behaviors they experienced     Dates: Start:  02/07/23                Intervention: Therapist will educate patient on cognitive distortions and the rationale for treatment of depression     Dates: Start:  02/07/23                 Intervention: Kallan will identify 3 trauma related cognitive distortions     Dates: Start:  02/07/23                Intervention: Tanairi will review pleasant activities list and select 2 activities to practice weekly for the next 8 weeks     Dates: Start:  02/07/23                     Problem: Self Esteem:     Dates: Start:  02/07/23       Description:      Disciplines: Counselor        Goal: LTG: Pt reports she wants to learn how to speak up for myself.      Dates: Start:  02/07/23    Expected End:  07/06/23       Description:      Disciplines: Counselor  Outcomes     Date/Time User Outcome    09/12/23 1601 Natali Lavallee P Progressing    02/07/23 1153 Piccola Arico P Initial            Goal note from Appointment 09/12/2023 by Almetta Jacquet B, LCSW     09/12/23: 35% progress; Instead of keeping it in and exploding, I let little bits out at a time.                     Goal: STG - Patient will identify 3 ways to improve self-esteem within 5 recreation therapy group sessions     Dates: Start:  02/07/23    Expected End:  07/06/23       Description:      Disciplines: Counselor         Outcomes     Date/Time User Outcome    09/12/23 1601 Woods Gangemi P Progressing    02/07/23 1153 Ashwika Freels P Initial            Goal note from Appointment 09/12/2023 by Marvin Slot, LCSW     09/12/23: Patient was able to identify options she can take to improve her self care and overall opinion about herself.                     Intervention: Coping Skills      Dates: Start:  02/07/23       Description: Will Work with patient to decrease the frequency of negative self-descriptive statements and increase the frequency of positive self- descriptive statements using CBT/DBT/REBT techniques per patient self report 3 out of 5 documented sessions. Some of the techniques that will be used will be CBT, positive affirmations, role playing, modeling, homework and  journaling.    Disciplines: Counselor             Intervention: Pt will learn about assertive, passive, and aggressive communication styles     Dates: Start:  02/07/23       Description:      Disciplines: Counselor             Intervention: Patient will identify 1 way in which she could have utilized assertive communication when reflecting on recently distressing encounter with others.      Dates: Start:  02/07/23       Description:      Disciplines: Counselor        ProgressTowards Goals: Progressing  Interventions: Supportive, assertive communication, solution focused  Summary: YOMAYRA TATE is a 38 y.o. female who presents with depressive symptoms citing anhedonia, tearfulness, flat affect, lack of motivation, low mood. Pt was oriented times 5. Pt was cooperative and engaged. Pt denies SI/HI/AVH.    Patient utilized therapeutic space to process recent trigger of her son being held back in the second grade much like she was.  However, patient identified controllable factors by being able to take her lived experience and use it advocate for her son's need with school staff.  Patient identified efforts she made with the school and attempt to mediate the current problem in efforts to improve next year situation.  Clinician encouraged the patient to utilize her understanding of the negative cognitive implications that this experience can have and encouraged her to be proactive and instilling positive affirmations for her son.  Patient also reflected on grief related to a loved ones recent passing and impact on her knees.  Clinician and patient reviewed self-care she can practice during this time.  Suicidal/Homicidal: Nowithout intent/plan  Therapist Response: Cln utilized active and supportive reflection to create environment for patient to process recent life stressors and symptoms. Clinician assessed for recent stressors, symptoms, and safety since last session.  Worked with  patient on problem solving and use of assertive communication navigating for her sons needs.  Reviewed self-care she can practice during grief.  Plan: Return again in 2 weeks.  Diagnosis: MDD (major depressive disorder), recurrent, in full remission (HCC)  Trauma and stressor-related disorder   Collaboration of Care: AEB psychiatrist can access notes and cln. Will review psychiatrists' notes. Check in with the patient and will see LCSW per availability. Patient agreed with treatment recommendations.   Patient/Guardian was advised Release of Information must be obtained prior to any record release in order to collaborate their care with an outside provider. Patient/Guardian was advised if they have not already done so to contact the registration department to sign all necessary forms in order for us  to release information regarding their care.   Consent: Patient/Guardian gives verbal consent for treatment and assignment of benefits for services provided during this visit. Patient/Guardian expressed understanding and agreed to proceed.   Marvin Slot, LCSW 09/12/2023

## 2023-09-20 ENCOUNTER — Encounter: Payer: Self-pay | Admitting: Licensed Clinical Social Worker

## 2023-09-20 ENCOUNTER — Ambulatory Visit: Admitting: Sleep Medicine

## 2023-09-21 ENCOUNTER — Ambulatory Visit: Admitting: Sleep Medicine

## 2023-09-26 ENCOUNTER — Ambulatory Visit: Admitting: Licensed Clinical Social Worker

## 2023-10-01 ENCOUNTER — Ambulatory Visit (INDEPENDENT_AMBULATORY_CARE_PROVIDER_SITE_OTHER): Admitting: Licensed Clinical Social Worker

## 2023-10-01 DIAGNOSIS — F439 Reaction to severe stress, unspecified: Secondary | ICD-10-CM

## 2023-10-01 DIAGNOSIS — F3342 Major depressive disorder, recurrent, in full remission: Secondary | ICD-10-CM

## 2023-10-01 NOTE — Progress Notes (Signed)
 THERAPIST PROGRESS NOTE  Virtual Visit via Video Note  I connected with Debra Bender on 10/01/23 at  1:00 PM EDT by a video enabled telemedicine application and verified that I am speaking with the correct person using two identifiers.  Location: Patient: Debra Bender Provider: [facility]   I discussed the limitations of evaluation and management by telemedicine and the availability of in person appointments. The patient expressed understanding and agreed to proceed.    I discussed the assessment and treatment plan with the patient. The patient was provided an opportunity to ask questions and all were answered. The patient agreed with the plan and demonstrated an understanding of the instructions.   The patient was advised to call back or seek an in-person evaluation if the symptoms worsen or if the condition fails to improve as anticipated.  I provided 52 minutes of non-face-to-face time during this encounter.   Debra Bender Husband, LCSW   Session Time: 1:05pm-1:57pm  Participation Level: Active  Behavioral Response: CasualAlertEuthymic  Type of Therapy: Individual Therapy  Treatment Goals addressed:  Active     OP Depression     LTG: Reduce frequency, intensity, and duration of depression symptoms so that daily functioning is improved (Progressing)     Start:  02/07/23    Expected End:  07/06/23       Goal Note     09/12/23: 55% progress; Reports she does not become as irritable as she used to. Identifies she able to engage in coping skills and continue with her day.          STG: Altair will identify cognitive patterns and beliefs that support depression (Progressing)     Start:  02/07/23    Expected End:  07/06/23       Goal Note     09/12/23: 50% progress; Shares she has made progress on recognizing unhelpful thought patterns outside oft therapy stating I don't project them on everyone around me like I used to.          Bender with [name] to  identify the major components of a recent episode of depression: physical symptoms, major thoughts and images, and major behaviors they experienced     Start:  02/07/23         Therapist will educate patient on cognitive distortions and the rationale for treatment of depression     Start:  02/07/23         Shanna will identify 3 trauma related cognitive distortions     Start:  02/07/23         Oria will review pleasant activities list and select 2 activities to practice weekly for the next 8 weeks     Start:  02/07/23           Self Esteem:         LTG: Pt reports she wants to learn how to speak up for myself.  (Progressing)     Start:  02/07/23    Expected End:  07/06/23          Goal Note     09/12/23: 35% progress; Instead of keeping it in and exploding, I let little bits out at a time.          STG - Patient will identify 3 ways to improve self-esteem within 5 recreation therapy group sessions (Progressing)     Start:  02/07/23    Expected End:  07/06/23  Goal Note     09/12/23: Patient was able to identify options she can take to improve her self care and overall opinion about herself.          Coping Skills      Start:  02/07/23      Will Bender with patient to decrease the frequency of negative self-descriptive statements and increase the frequency of positive self- descriptive statements using CBT/DBT/REBT techniques per patient self report 3 out of 5 documented sessions. Some of the techniques that will be used will be CBT, positive affirmations, role playing, modeling, homework and journaling.      Pt will learn about assertive, passive, and aggressive communication styles     Start:  02/07/23            Patient will identify 1 way in which she could have utilized assertive communication when reflecting on recently distressing encounter with others.      Start:  02/07/23               ProgressTowards Goals:  Progressing  Interventions: Supportive, Solution Focused, Mindfulness, CBT, Reframing  Summary: Debra Bender is a 38 y.o. female who presents with depressive symptoms citing anhedonia, tearfulness, lack of motivation, low mood. Pt was oriented times 5. Pt was cooperative and engaged. Pt denies SI/HI/AVH.  The report indicates that the patient's husband's health is declining, which has emotionally drained her. She has identified efforts she has made to problem-solve while grappling with the what ifs that her anxious thoughts are prompting her to consider. Together with the patient, we worked to identify the factors she can control in her situation.   She reflected on her marriage and expressed feelings of anger and fear. She feels unable to authentically express her feelings about fear and love, especially in relation to her husband's mental health and its impact on the family. She also acknowledged difficulties in communication, expressing that she often feels unheard.  The clinician made efforts to help the patient reframe her perspective and address her unresolved anger. They identified controllable factors related to her self-care and communication with others. Additionally, they defined the grieving process and emphasized the importance of giving herself time to experience her emotions. Identified solutions to alleviate her stressors with patient expressing a desire to leave session today, and address changes.    Suicidal/Homicidal: Nowithout intent/plan  Therapist Response: Cln utilized active and supportive reflection to create environment for patient to process recent life stressors and symptoms. Clinician assessed for recent stressors, symptoms, and safety since last session. Worked through solutions to improve her current situation by having the client focus on what she can control.  Used CBT to assist in reframing negative cognitions.   Plan: Return again in 2 weeks.  Diagnosis:  MDD (major depressive disorder), recurrent, in full remission (HCC)  Trauma and stressor-related disorder   Collaboration of Care: AEB psychiatrist can access notes and cln. Will review psychiatrists' notes. Check in with the patient and will see LCSW per availability. Patient agreed with treatment recommendations.   Patient/Guardian was advised Release of Information must be obtained prior to any record release in order to collaborate their care with an outside provider. Patient/Guardian was advised if they have not already done so to contact the registration department to sign all necessary forms in order for us  to release information regarding their care.   Consent: Patient/Guardian gives verbal consent for treatment and assignment of benefits for services provided during this visit. Patient/Guardian expressed understanding  and agreed to proceed.   Debra Bender Husband, LCSW 10/01/2023

## 2023-10-10 ENCOUNTER — Ambulatory Visit (INDEPENDENT_AMBULATORY_CARE_PROVIDER_SITE_OTHER): Admitting: Licensed Clinical Social Worker

## 2023-10-10 DIAGNOSIS — F439 Reaction to severe stress, unspecified: Secondary | ICD-10-CM | POA: Diagnosis not present

## 2023-10-10 DIAGNOSIS — F3342 Major depressive disorder, recurrent, in full remission: Secondary | ICD-10-CM | POA: Diagnosis not present

## 2023-10-10 NOTE — Progress Notes (Signed)
 THERAPIST PROGRESS NOTE  Session Time: 4-4:59pm  Participation Level: Active  Behavioral Response: CasualAlertEuthymic  Type of Therapy: Individual Therapy  Treatment Goals addressed:  Active     OP Depression     LTG: Reduce frequency, intensity, and duration of depression symptoms so that daily functioning is improved (Not Progressing)     Start:  02/07/23    Expected End:  07/06/23       Goal Note     10/01/23: Reports due to stressors in her life and outside of her control, she cannot progress with her depression as she is struggling to cope with her stressors. Shares she is often emotionally drained or irritable.          STG: Debra Bender will identify cognitive patterns and beliefs that support depression (Progressing)     Start:  02/07/23    Expected End:  07/06/23         Work with Debra Bender to identify the major components of a recent episode of depression: physical symptoms, major thoughts and images, and major behaviors they experienced     Start:  02/07/23         Therapist will educate patient on cognitive distortions and the rationale for treatment of depression     Start:  02/07/23         Debra Bender will identify 3 trauma related cognitive distortions     Start:  02/07/23         Debra Bender will review pleasant activities list and select 2 activities to practice weekly for the next 8 weeks     Start:  02/07/23           Self Esteem:         LTG: Pt reports she wants to learn how to speak up for myself.  (Progressing)     Start:  02/07/23    Expected End:  07/06/23          Goal Note     10/01/23: Shares situations where she has been able to advocate for her and her children's needs based on her own trauma history and ability to process through therapy.          STG - Patient will identify 3 ways to improve self-esteem within 5 recreation therapy group sessions (Progressing)     Start:  02/07/23    Expected End:  07/06/23             Coping Skills      Start:  02/07/23      Will Work with patient to decrease the frequency of negative self-descriptive statements and increase the frequency of positive self- descriptive statements using CBT/DBT/REBT techniques per patient self report 3 out of 5 documented sessions. Some of the techniques that will be used will be CBT, positive affirmations, role playing, modeling, homework and journaling.      Pt will learn about assertive, passive, and aggressive communication styles     Start:  02/07/23            Patient will identify 1 way in which she could have utilized assertive communication when reflecting on recently distressing encounter with others.      Start:  02/07/23               ProgressTowards Goals: Progressing  Interventions: CBT, Supportive, and Reframing  Summary: Debra Bender is a 38 y.o. female who presents with depressive symptoms citing anhedonia, tearfulness, lack of motivation, low mood. Pt was oriented  times 5. Pt was cooperative and engaged. Pt denies SI/HI/AVH.    The patient used the therapeutic session to process recent stressors related to her husband's declining physical health and her anxiety about expressing her feelings in a healthy way. She reported experiencing increased urges to self-harm, stating, It is easier to feel the physical pain than the emotional pain. The clinician worked with her to challenge this perspective and explore how suppressing her feelings is harmful.  Together, they identified safe ways for her to express her emotions, such as journaling, creating audio messages, or reaching out to her support system. They focused on controllable factors that could help her feel secure and encouraged her to navigate her feelings from a supportive standpoint rather than reacting out of resentment.  The clinician assisted the patient in naming her emotions and articulating the worst-case scenarios generated by her anxious thoughts. A  safe space was provided for her to process these feelings in real-time, helping her realize that she can sit with her feelings and still be okay.  The patient reflected on messages from her childhood that led her to believe it was unsafe to feel or express her emotions. The clinician challenged her to consider the messages she is conveying to those around her, especially as they also grieve.  Additionally, the clinician provided brief psychoeducation on grief, helping the patient understand the stages of grief. The session concluded with the clinician assisting the patient in returning to her baseline by practicing her coping skills.  Suicidal/Homicidal: Nowithout intent/plan  Therapist Response: Cln utilized active and supportive reflection to create environment for patient to process recent life stressors and symptoms. Clinician assessed for recent stressors, symptoms, and safety since last session.  Clinician and patient worked together to process feelings of grief based on her current situation and unfolding dynamics.  Assisted patient in naming her feelings and learning to sit with her feelings.  Reviewed coping skills patient can utilize to manage emotions as she continues to cope with her current situation.  Plan: Return again in 2 weeks.  Diagnosis: MDD (major depressive disorder), recurrent, in full remission (HCC)  Trauma and stressor-related disorder   Collaboration of Care: AEB psychiatrist can access notes and cln. Will review psychiatrists' notes. Check in with the patient and will see LCSW per availability. Patient agreed with treatment recommendations.   Patient/Guardian was advised Release of Information must be obtained prior to any record release in order to collaborate their care with an outside provider. Patient/Guardian was advised if they have not already done so to contact the registration department to sign all necessary forms in order for us  to release information  regarding their care.   Consent: Patient/Guardian gives verbal consent for treatment and assignment of benefits for services provided during this visit. Patient/Guardian expressed understanding and agreed to proceed.   [name] Husband, LCSW 10/10/2023

## 2023-10-17 ENCOUNTER — Telehealth: Payer: Self-pay | Admitting: Sleep Medicine

## 2023-10-17 NOTE — Telephone Encounter (Signed)
 Dr. Jess order new cpap setup on 06/21/23 and the order was sent to [facility]. We received a note from [facility] they had made multiple attempts to contact the patient with no response. I just called the patient she stated she hasn't heard from anyone about the cpap machine. She ask if I would give her [facility]'s phone number [phone] by mychart. I am sending now

## 2023-10-31 ENCOUNTER — Ambulatory Visit (INDEPENDENT_AMBULATORY_CARE_PROVIDER_SITE_OTHER): Admitting: Licensed Clinical Social Worker

## 2023-10-31 DIAGNOSIS — F439 Reaction to severe stress, unspecified: Secondary | ICD-10-CM

## 2023-10-31 DIAGNOSIS — F3342 Major depressive disorder, recurrent, in full remission: Secondary | ICD-10-CM

## 2023-10-31 NOTE — Progress Notes (Signed)
 THERAPIST PROGRESS NOTE  Session Time: 4:02pm-4:53pm  Participation Level: Active  Behavioral Response: CasualAlertDepressed  Type of Therapy: Individual Therapy  Treatment Goals addressed:  Active     OP Depression     LTG: Reduce frequency, intensity, and duration of depression symptoms so that daily functioning is improved (Not Progressing)     Start:  02/07/23    Expected End:  07/06/23       Goal Note     10/01/23: Reports due to stressors in her life and outside of her control, she cannot progress with her depression as she is struggling to cope with her stressors. Shares she is often emotionally drained or irritable.          STG: Debra Bender will identify cognitive patterns and beliefs that support depression (Progressing)     Start:  02/07/23    Expected End:  07/06/23         Work with Debra Bender to identify the major components of a recent episode of depression: physical symptoms, major thoughts and images, and major behaviors they experienced     Start:  02/07/23         Therapist will educate patient on cognitive distortions and the rationale for treatment of depression     Start:  02/07/23         Debra Bender will identify 3 trauma related cognitive distortions     Start:  02/07/23         Debra Bender will review pleasant activities list and select 2 activities to practice weekly for the next 8 weeks     Start:  02/07/23           Self Esteem:         LTG: Pt reports she wants to learn how to speak up for myself.  (Progressing)     Start:  02/07/23    Expected End:  07/06/23          Goal Note     10/01/23: Shares situations where she has been able to advocate for her and her children's needs based on her own trauma history and ability to process through therapy.          STG - Patient will identify 3 ways to improve self-esteem within 5 recreation therapy group sessions (Progressing)     Start:  02/07/23    Expected End:  07/06/23             Coping Skills      Start:  02/07/23      Will Work with patient to decrease the frequency of negative self-descriptive statements and increase the frequency of positive self- descriptive statements using CBT/DBT/REBT techniques per patient self report 3 out of 5 documented sessions. Some of the techniques that will be used will be CBT, positive affirmations, role playing, modeling, homework and journaling.      Pt will learn about assertive, passive, and aggressive communication styles     Start:  02/07/23            Patient will identify 1 way in which she could have utilized assertive communication when reflecting on recently distressing encounter with others.      Start:  02/07/23               ProgressTowards Goals: Progressing  Interventions: Solution Focused and Supportive  Summary: Debra Bender is a 38 y.o. female who presents with depressive symptoms citing anhedonia, tearfulness, lack of motivation, low mood. Pt was oriented  times 5. Pt was cooperative and engaged. Pt denies SI/HI/AVH.    The patient utilized the therapeutic space to process recent health updates concerning her Bender and upcoming procedures. She reflected on feeling overwhelmed and frustrated with the requirements of stepping into the role of a caretaker, providing 24-hour care for him. The patient identified feelings of low energy, frustration, and guilt.   Additionally, the patient expressed uncontrollable anxiety and fear regarding her son's health due to genetic conditions. The clinician assessed her basic self-care and discovered that she is not sleeping or eating adequately, and she has not been taking her mental health medications. They discussed strategies to address these barriers, and the clinician encouraged her to find creative ways to manage her medication routine and ensure she obtains necessary nutrients, even if it involves using protein shakes.  The patient also expressed  concerns about feeling alone. The clinician challenged this perception and examined the patient's support system. The patient reported feeling uncomfortable asking for help, and the clinician worked with her to address this issue.  Suicidal/Homicidal: Nowithout intent/plan  Therapist Response: Cln utilized active and supportive reflection to create environment for patient to process recent life stressors and symptoms. Clinician assessed for recent stressors, symptoms, and safety since last session.  Clinician worked with patient on identifying controllable factors such as maintaining basic necessities through self-care and addressing barriers interfering with her ability to take medications and obtain basic nutrients.  Plan: Return again in 2 weeks.  Diagnosis: MDD (major depressive disorder), recurrent, in full remission (HCC)  Trauma and stressor-related disorder   Collaboration of Care: AEB psychiatrist can access notes and cln. Will review psychiatrists' notes. Check in with the patient and will see LCSW per availability. Patient agreed with treatment recommendations.   Patient/Guardian was advised Release of Information must be obtained prior to any record release in order to collaborate their care with an outside provider. Patient/Guardian was advised if they have not already done so to contact the registration department to sign all necessary forms in order for us  to release information regarding their care.   Consent: Patient/Guardian gives verbal consent for treatment and assignment of benefits for services provided during this visit. Patient/Guardian expressed understanding and agreed to proceed.   Debra [name] Husband, LCSW 10/31/2023

## 2023-11-28 ENCOUNTER — Ambulatory Visit (INDEPENDENT_AMBULATORY_CARE_PROVIDER_SITE_OTHER): Admitting: Licensed Clinical Social Worker

## 2023-11-28 DIAGNOSIS — F439 Reaction to severe stress, unspecified: Secondary | ICD-10-CM | POA: Diagnosis not present

## 2023-11-28 DIAGNOSIS — F3342 Major depressive disorder, recurrent, in full remission: Secondary | ICD-10-CM

## 2023-11-28 NOTE — Progress Notes (Unsigned)
 THERAPIST PROGRESS NOTE  Session Time: 3:50pm-4:50pm  Participation Level: Active  Behavioral Response: CasualAlertDepressed  Type of Therapy: Individual Therapy  Treatment Goals addressed:  Active     OP Depression     LTG: Reduce frequency, intensity, and duration of depression symptoms so that daily functioning is improved (Not Progressing)     Start:  02/07/23    Expected End:  07/06/23       Goal Note     10/01/23: Reports due to stressors in her life and outside of her control, she cannot progress with her depression as she is struggling to cope with her stressors. Shares she is often emotionally drained or irritable.          STG: Debra Bender will identify cognitive patterns and beliefs that support depression (Progressing)     Start:  02/07/23    Expected End:  07/06/23         Work with [name] to identify the major components of a recent episode of depression: physical symptoms, major thoughts and images, and major behaviors they experienced     Start:  02/07/23         Therapist will educate patient on cognitive distortions and the rationale for treatment of depression     Start:  02/07/23         Hanaa will identify 3 trauma related cognitive distortions     Start:  02/07/23         Shenna will review pleasant activities list and select 2 activities to practice weekly for the next 8 weeks     Start:  02/07/23           Self Esteem:         LTG: Pt reports she wants to learn how to speak up for myself.  (Progressing)     Start:  02/07/23    Expected End:  07/06/23          Goal Note     10/01/23: Shares situations where she has been able to advocate for her and her children's needs based on her own trauma history and ability to process through therapy.          STG - Patient will identify 3 ways to improve self-esteem within 5 recreation therapy group sessions (Progressing)     Start:  02/07/23    Expected End:  07/06/23             Coping Skills      Start:  02/07/23      Will Work with patient to decrease the frequency of negative self-descriptive statements and increase the frequency of positive self- descriptive statements using CBT/DBT/REBT techniques per patient self report 3 out of 5 documented sessions. Some of the techniques that will be used will be CBT, positive affirmations, role playing, modeling, homework and journaling.      Pt will learn about assertive, passive, and aggressive communication styles     Start:  02/07/23            Patient will identify 1 way in which she could have utilized assertive communication when reflecting on recently distressing encounter with others.      Start:  02/07/23               ProgressTowards Goals: Progressing  Interventions: {CHL AMB BH Type of Intervention:[id number]}  Summary: Debra Bender is a 38 y.o. female who presents with depressive symptoms citing anhedonia, tearfulness, lack of motivation, low mood. Pt  was oriented times 5. Pt was cooperative and engaged. Pt denies SI/HI/AVH.     The patient utilized the therapeutic space to process recent health updates concerning her husband and upcoming procedures.    Suicidal/Homicidal: Nowithout intent/plan  Therapist Response: Cln utilized active and supportive reflection to create environment for patient to process recent life stressors and symptoms. Clinician assessed for recent stressors, symptoms, and safety since last session.    Plan: Return again in 2 weeks.  Diagnosis: MDD (major depressive disorder), recurrent, in full remission (HCC)  Trauma and stressor-related disorder   Collaboration of Care: AEB psychiatrist can access notes and cln. Will review psychiatrists' notes. Check in with the patient and will see LCSW per availability. Patient agreed with treatment recommendations.   Patient/Guardian was advised Release of Information must be obtained prior to any record release in order  to collaborate their care with an outside provider. Patient/Guardian was advised if they have not already done so to contact the registration department to sign all necessary forms in order for us  to release information regarding their care.   Consent: Patient/Guardian gives verbal consent for treatment and assignment of benefits for services provided during this visit. Patient/Guardian expressed understanding and agreed to proceed.   Debra Bender Husband, LCSW 11/28/2023

## 2023-12-12 ENCOUNTER — Ambulatory Visit (INDEPENDENT_AMBULATORY_CARE_PROVIDER_SITE_OTHER): Admitting: Licensed Clinical Social Worker

## 2023-12-12 DIAGNOSIS — F3342 Major depressive disorder, recurrent, in full remission: Secondary | ICD-10-CM

## 2023-12-12 DIAGNOSIS — F439 Reaction to severe stress, unspecified: Secondary | ICD-10-CM

## 2023-12-12 NOTE — Progress Notes (Signed)
 THERAPIST PROGRESS NOTE  Session Time: 4:08pm-5:01pm  Participation Level: Active  Behavioral Response: CasualAlertDepressed  Type of Therapy: Individual Therapy  Treatment Goals addressed:  Active     OP Depression     LTG: Reduce frequency, intensity, and duration of depression symptoms so that daily functioning is improved (Not Progressing)     Start:  02/07/23    Expected End:  03/12/24      12/11/13: Patient reports I feel like I am on autopilot. Cites she is emotionally numb and going the through the motions of caring for her husband.     Goal Note     10/01/23: Reports due to stressors in her life and outside of her control, she cannot progress with her depression as she is struggling to cope with her stressors. Shares she is often emotionally drained or irritable.          STG: Debra Bender will identify cognitive patterns and beliefs that support depression (Progressing)     Start:  02/07/23    Expected End:  03/12/24         Work with Debra Bender to identify the major components of a recent episode of depression: physical symptoms, major thoughts and images, and major behaviors they experienced     Start:  02/07/23         Therapist will educate patient on cognitive distortions and the rationale for treatment of depression     Start:  02/07/23         Debra Bender will identify 3 trauma related cognitive distortions     Start:  02/07/23         Debra Bender will review pleasant activities list and select 2 activities to practice weekly for the next 8 weeks     Start:  02/07/23           Self Esteem:         LTG: Pt reports she wants to learn how to speak up for myself.  (Progressing)     Start:  02/07/23    Expected End:  03/12/24      12/12/23: Patient reports she has not been able to use her voice as she would like.    Goal Note     10/01/23: Shares situations where she has been able to advocate for her and her children's needs based on her own trauma  history and ability to process through therapy.          STG - Patient will identify 3 ways to improve self-esteem within 5 recreation therapy group sessions (Progressing)     Start:  02/07/23    Expected End:  03/12/24            Coping Skills      Start:  02/07/23      Will Work with patient to decrease the frequency of negative self-descriptive statements and increase the frequency of positive self- descriptive statements using CBT/DBT/REBT techniques per patient self report 3 out of 5 documented sessions. Some of the techniques that will be used will be CBT, positive affirmations, role playing, modeling, homework and journaling.      Pt will learn about assertive, passive, and aggressive communication styles     Start:  02/07/23            Patient will identify 1 way in which she could have utilized assertive communication when reflecting on recently distressing encounter with others.      Start:  02/07/23  ProgressTowards Goals: Progressing  Interventions: Assertiveness Training and Supportive  Summary: Debra Bender is a 38 y.o. female who presents with depressive symptoms citing anhedonia, tearfulness, lack of motivation, low mood. Pt was oriented times 5. Pt was cooperative and engaged. Pt denies SI/HI/AVH.     Cln utilized the first half of session to review patients progress. See progress notes documented above.   The clinician readministered the PHQ-9 and GAD-7 assessments. The patient's anxiety scores increased from 3 to 8, and depression scores also decreased from 4 to 3. The patient shares stressors related to her husband recently undergoing heart surgery. Patient reports I feel like I am on autopilot. Cites she is emotionally numb and going the through the motions of caring for her husband. Patient reports she has not been able to use her voice as she would like.  Patient therapeutic space to process the impact of serving as a caretaker  for her husband who is currently in the ICU.  Patient reports due to the needs of supporting her husband following open heart surgery she has experienced limited sleep and has not left the ICU in 2 weeks.  Clinician and patient processed ways in which she is utilizing her support network during this time.  Patient also processed frustrations related to various dynamics within her support system.  Clinician and patient reflected on controllable factors.  Patient reports she has been snapping her rubber band on her wrist often as a result of feeling overwhelmed.  Clinician reviewed coping skills with patient and closed out session using breathing techniques to assist patient in returning to baseline.   Suicidal/Homicidal: Nowithout intent/plan  Therapist Response: Cln utilized active and supportive reflection to create environment for patient to process recent life stressors and symptoms. Clinician assessed for recent stressors, symptoms, and safety since last session.  Reflected on ways in which patient can continue to utilize assertive communication to advocate for her needs as well as her husband's needs during this time but patient continues to report barriers to using her voice.  Clinician worked with patient to assess for use of coping skills.  Plan: Return again in 2 weeks.  Diagnosis: MDD (major depressive disorder), recurrent, in full remission (HCC)  Trauma and stressor-related disorder   Collaboration of Care: AEB psychiatrist can access notes and cln. Will review psychiatrists' notes. Check in with the patient and will see LCSW per availability. Patient agreed with treatment recommendations.   Patient/Guardian was advised Release of Information must be obtained prior to any record release in order to collaborate their care with an outside provider. Patient/Guardian was advised if they have not already done so to contact the registration department to sign all necessary forms in order for us   to release information regarding their care.   Consent: Patient/Guardian gives verbal consent for treatment and assignment of benefits for services provided during this visit. Patient/Guardian expressed understanding and agreed to proceed.   Debra Bender Husband, LCSW 12/12/2023

## 2023-12-25 ENCOUNTER — Ambulatory Visit (INDEPENDENT_AMBULATORY_CARE_PROVIDER_SITE_OTHER): Admitting: Licensed Clinical Social Worker

## 2023-12-25 DIAGNOSIS — F439 Reaction to severe stress, unspecified: Secondary | ICD-10-CM | POA: Diagnosis not present

## 2023-12-25 DIAGNOSIS — F3342 Major depressive disorder, recurrent, in full remission: Secondary | ICD-10-CM

## 2023-12-25 NOTE — Progress Notes (Signed)
 THERAPIST PROGRESS NOTE  Session Time: 4:05-4:57pm  Participation Level: Active  Behavioral Response: Casual Alert Depressed  Type of Therapy: Individual Therapy  Treatment Goals addressed:  Active     OP Depression     LTG: Reduce frequency, intensity, and duration of depression symptoms so that daily functioning is improved (Not Progressing)     Start:  02/07/23    Expected End:  03/12/24      12/11/13: Patient reports I feel like I am on autopilot. Cites she is emotionally numb and going the through the motions of caring for her husband.     Goal Note     10/01/23: Reports due to stressors in her life and outside of her control, she cannot progress with her depression as she is struggling to cope with her stressors. Shares she is often emotionally drained or irritable.          STG: Giovanni will identify cognitive patterns and beliefs that support depression (Progressing)     Start:  02/07/23    Expected End:  03/12/24         Work with [name] to identify the major components of a recent episode of depression: physical symptoms, major thoughts and images, and major behaviors they experienced     Start:  02/07/23         Therapist will educate patient on cognitive distortions and the rationale for treatment of depression     Start:  02/07/23         Rosaleigh will identify 3 trauma related cognitive distortions     Start:  02/07/23         Khamil will review pleasant activities list and select 2 activities to practice weekly for the next 8 weeks     Start:  02/07/23           Self Esteem:         LTG: Pt reports she wants to learn how to speak up for myself.  (Progressing)     Start:  02/07/23    Expected End:  03/12/24      12/12/23: Patient reports she has not been able to use her voice as she would like.    Goal Note     10/01/23: Shares situations where she has been able to advocate for her and her children's needs based on her own trauma  history and ability to process through therapy.          STG - Patient will identify 3 ways to improve self-esteem within 5 recreation therapy group sessions (Progressing)     Start:  02/07/23    Expected End:  03/12/24            Coping Skills      Start:  02/07/23      Will Work with patient to decrease the frequency of negative self-descriptive statements and increase the frequency of positive self- descriptive statements using CBT/DBT/REBT techniques per patient self report 3 out of 5 documented sessions. Some of the techniques that will be used will be CBT, positive affirmations, role playing, modeling, homework and journaling.      Pt will learn about assertive, passive, and aggressive communication styles     Start:  02/07/23            Patient will identify 1 way in which she could have utilized assertive communication when reflecting on recently distressing encounter with others.      Start:  02/07/23  ProgressTowards Goals: Progressing  Interventions: CBT, Supportive, and Reframing  Summary: GEONNA LOCKYER is a 38 y.o. female who presents with depressive symptoms citing anhedonia, tearfulness, lack of motivation, low mood. Pt was oriented times 5. Pt was cooperative and engaged. Pt denies SI/HI/AVH.   The patient used the therapeutic space to process her feelings related to the stressors following her husband's recent surgery. She expressed that she feels unable to work now since her husband cannot drive himself to appointments for the next six months. The patient also mentioned having difficulty falling asleep, reporting that she only slept for two hours the night before. Furthermore, she indicated that she is not engaging in self-care, as she has not taken her mental health medications for over a month.  The patient feels the need to serve as everyone's caretaker to maintain control, believing this is easier than tending to her own needs. The  clinician worked with her to challenge this perspective by identifying factors she can control. We also addressed her misplaced guilt about prioritizing her well-being over her responsibilities to care for others. The clinician encouraged the patient to consider the benefits of taking her medications, which she acknowledged could help her be less irritable towards her children.   Additionally, the patient admitted to having difficulties asking for help. The clinician collaborated with her to establish personal steps and goals to seek further support.  Suicidal/Homicidal: Nowithout intent/plan  Therapist Response: Cln utilized active and supportive reflection to create environment for patient to process recent life stressors and symptoms. Clinician assessed for recent stressors, symptoms, and safety since last session.  Utilized CBT to challenge patient's negative cognitions around asking for help and misplaced guilt.  Worked with patient to identify possible solutions to increasing supportive networks during this time.  Encourage patient to reach out to her husband's social worker through the hospital to inquire about further support.  Encouraged patient to reach out to those within her support system who understand exactly which she and her husband are coping with at the moment.  Clinician also encouraged patient to reach out to her psychiatrist to address concerns regarding sleep medications not aiding in falling asleep.  Plan: Return again in 2 weeks.  Diagnosis: MDD (major depressive disorder), recurrent, in full remission  Trauma and stressor-related disorder   Collaboration of Care: AEB psychiatrist can access notes and cln. Will review psychiatrists' notes. Check in with the patient and will see LCSW per availability. Patient agreed with treatment recommendations.   Patient/Guardian was advised Release of Information must be obtained prior to any record release in order to collaborate their  care with an outside provider. Patient/Guardian was advised if they have not already done so to contact the registration department to sign all necessary forms in order for us  to release information regarding their care.   Consent: Patient/Guardian gives verbal consent for treatment and assignment of benefits for services provided during this visit. Patient/Guardian expressed understanding and agreed to proceed.   [name] Husband, LCSW 12/25/2023

## 2023-12-27 ENCOUNTER — Encounter: Payer: Self-pay | Admitting: Licensed Clinical Social Worker

## 2024-01-09 ENCOUNTER — Ambulatory Visit (INDEPENDENT_AMBULATORY_CARE_PROVIDER_SITE_OTHER): Admitting: Licensed Clinical Social Worker

## 2024-01-09 DIAGNOSIS — F439 Reaction to severe stress, unspecified: Secondary | ICD-10-CM

## 2024-01-09 DIAGNOSIS — F3342 Major depressive disorder, recurrent, in full remission: Secondary | ICD-10-CM | POA: Diagnosis not present

## 2024-01-09 NOTE — Progress Notes (Signed)
 THERAPIST PROGRESS NOTE  Session Time: 4:01pm-4:56pm  Participation Level: Active  Behavioral Response: CasualAlertDepressed  Type of Therapy: Individual Therapy  Treatment Goals addressed:  Active     OP Depression     LTG: Reduce frequency, intensity, and duration of depression symptoms so that daily functioning is improved (Not Progressing)     Start:  02/07/23    Expected End:  03/12/24      12/11/13: Patient reports I feel like I am on autopilot. Cites she is emotionally numb and going the through the motions of caring for her husband.     Goal Note     10/01/23: Reports due to stressors in her life and outside of her control, she cannot progress with her depression as she is struggling to cope with her stressors. Shares she is often emotionally drained or irritable.          STG: Kinsleigh will identify cognitive patterns and beliefs that support depression (Progressing)     Start:  02/07/23    Expected End:  03/12/24         Work with [name] to identify the major components of a recent episode of depression: physical symptoms, major thoughts and images, and major behaviors they experienced     Start:  02/07/23         Therapist will educate patient on cognitive distortions and the rationale for treatment of depression     Start:  02/07/23         Braniyah will identify 3 trauma related cognitive distortions     Start:  02/07/23         Lafaye will review pleasant activities list and select 2 activities to practice weekly for the next 8 weeks     Start:  02/07/23           Self Esteem:         LTG: Pt reports she wants to learn how to speak up for myself.  (Progressing)     Start:  02/07/23    Expected End:  03/12/24      12/12/23: Patient reports she has not been able to use her voice as she would like.    Goal Note     10/01/23: Shares situations where she has been able to advocate for her and her children's needs based on her own trauma  history and ability to process through therapy.          STG - Patient will identify 3 ways to improve self-esteem within 5 recreation therapy group sessions (Progressing)     Start:  02/07/23    Expected End:  03/12/24            Coping Skills      Start:  02/07/23      Will Work with patient to decrease the frequency of negative self-descriptive statements and increase the frequency of positive self- descriptive statements using CBT/DBT/REBT techniques per patient self report 3 out of 5 documented sessions. Some of the techniques that will be used will be CBT, positive affirmations, role playing, modeling, homework and journaling.      Pt will learn about assertive, passive, and aggressive communication styles     Start:  02/07/23            Patient will identify 1 way in which she could have utilized assertive communication when reflecting on recently distressing encounter with others.      Start:  02/07/23  ProgressTowards Goals: Progressing  Interventions: Solution-focused, psychoeducation, assertiveness training, supportive Summary: ABRIAL ARRIGHI is a 38 y.o. female who presents with depressive symptoms citing anhedonia, tearfulness, lack of motivation, low mood. Pt was oriented times 5. Pt was cooperative and engaged. Pt denies SI/HI/AVH.   Patient reports she had a mental breakdown on Sunday as this was the first time feeling feeling since her husband's surgery.  Patient reports she was feeling overwhelmed and due to depressive episode she had neglected cleaning her home.  Patient identified positive support from her family as they were able to help her calm down as well as reset her space.  Patient reflected on stressors both at work and in the home.  Identified ways in which she can utilize assertive communication once her husband returns home to address mutual understanding within the marriage.  Patient reports her husband is returning home since  having open heart surgery.  Reports she is feeling both excited and scared.  States she is still on autopilot and stuffs her feelings.  Clinician assessed for coping skill with patient identifying a desire and a need to replace unhealthy coping skills with healthier coping skills.  Patient reports in the past journaling/writing poetry has been helpful as well as sitting in her car and talking our feelings.  Patient reports her desire to self-harm has increased but she has successfully utilize rubber bands to prolong urges.  However, patient states I am really close.  Patient agreed to remove all sharp objects from her room as well as call her best friend or sister-in-law should she want to self-harm. Educated patient in TIPP to address self harm urges from feeling overwhelmed.  Suicidal/Homicidal: Nowithout intent/plan  Therapist Response: Cln utilized active and supportive reflection to create environment for patient to process recent life stressors and symptoms. Clinician assessed for recent stressors, symptoms, and safety since last session.  Reflected on patient's use of coping skills and work to identify possible replacements.  Challenged patient to utilize assertive communication to ensure she and her husband were on the same page moving forward in their partnership.  Reflected on patient's urges to self-harm and identified methods to prolong urged and behavior.  Plan: Return again in 2 weeks.  Diagnosis: MDD (major depressive disorder), recurrent, in full remission  Trauma and stressor-related disorder   Collaboration of Care: AEB psychiatrist can access notes and cln. Will review psychiatrists' notes. Check in with the patient and will see LCSW per availability. Patient agreed with treatment recommendations.   Patient/Guardian was advised Release of Information must be obtained prior to any record release in order to collaborate their care with an outside provider. Patient/Guardian was  advised if they have not already done so to contact the registration department to sign all necessary forms in order for us  to release information regarding their care.   Consent: Patient/Guardian gives verbal consent for treatment and assignment of benefits for services provided during this visit. Patient/Guardian expressed understanding and agreed to proceed.   [name] Husband, LCSW 01/09/2024

## 2024-01-10 ENCOUNTER — Encounter: Payer: Self-pay | Admitting: Licensed Clinical Social Worker

## 2024-01-29 NOTE — Progress Notes (Unsigned)
 THERAPIST PROGRESS NOTE  1 Year Review  Session Time: 4:06-5pm  Participation Level: Active  Behavioral Response: CasualAlertDepressed  Type of Therapy: Individual Therapy  Treatment Goals addressed:  Active     OP Depression     LTG: Reduce frequency, intensity, and duration of depression symptoms so that daily functioning is improved (Not Progressing)     Start:  02/07/23    Expected End:  03/12/24      12/11/13: Patient reports I feel like I am on autopilot. Cites she is emotionally numb and going the through the motions of caring for her husband.     Goal Note     10/01/23: Reports due to stressors in her life and outside of her control, she cannot progress with her depression as she is struggling to cope with her stressors. Shares she is often emotionally drained or irritable.          STG: Nyleah will identify cognitive patterns and beliefs that support depression (Progressing)     Start:  02/07/23    Expected End:  03/12/24         Work with [name] to identify the major components of a recent episode of depression: physical symptoms, major thoughts and images, and major behaviors they experienced     Start:  02/07/23         Therapist will educate patient on cognitive distortions and the rationale for treatment of depression     Start:  02/07/23         Sona will identify 3 trauma related cognitive distortions     Start:  02/07/23         Brianne will review pleasant activities list and select 2 activities to practice weekly for the next 8 weeks     Start:  02/07/23           Self Esteem:         LTG: Pt reports she wants to learn how to speak up for myself.  (Progressing)     Start:  02/07/23    Expected End:  03/12/24      12/12/23: Patient reports she has not been able to use her voice as she would like.    Goal Note     10/01/23: Shares situations where she has been able to advocate for her and her children's needs based on her  own trauma history and ability to process through therapy.          STG - Patient will identify 3 ways to improve self-esteem within 5 recreation therapy group sessions (Progressing)     Start:  02/07/23    Expected End:  03/12/24            Coping Skills      Start:  02/07/23      Will Work with patient to decrease the frequency of negative self-descriptive statements and increase the frequency of positive self- descriptive statements using CBT/DBT/REBT techniques per patient self report 3 out of 5 documented sessions. Some of the techniques that will be used will be CBT, positive affirmations, role playing, modeling, homework and journaling.      Pt will learn about assertive, passive, and aggressive communication styles     Start:  02/07/23            Patient will identify 1 way in which she could have utilized assertive communication when reflecting on recently distressing encounter with others.      Start:  02/07/23  ProgressTowards Goals: Progressing  Interventions: {CHL AMB BH Type of Intervention:[id number]}  Summary:  KHILYNN BORNTREGER is a 38 y.o. female who presents with depressive symptoms citing anhedonia, tearfulness, lack of motivation, low mood. Pt was oriented times 5. Pt was cooperative and engaged. Pt denies SI/HI/AVH.   UPDATE [facility]  Suicidal/Homicidal: Nowithout intent/plan  Therapist Response: Cln utilized active and supportive reflection to create environment for patient to process recent life stressors and symptoms. Clinician assessed for recent stressors, symptoms, and safety since last session.    Assessment: Client continues to meet the diagnostic criteria for MDD (major depressive disorder), recurrent episode, moderate (HCC) and Trauma and stressor-related disorders as evidenced by persistent symptoms including little interest or pleasure in doing things, depressed mood, trouble staying asleep, fatigue, anxious feelings, uncontrollable  worry, difficulty relaxing, irritability. Despite significant progress made in treatment over the past year, client continues to experience functional impairment in areas of life, e.g., supporting her husband through open heart surgery, relationships, self-care and has not yet achieved a stable level of functioning. Without continued therapeutic support, there is a substantial risk of symptom relapse and a decline in functioning.  Intervention and Rationale: Continued therapeutic intervention is medically necessary to:  Achieve treatment goals: Help the client progress toward their established long-term goals of improving emotional regulation, processing trauma, improving self-esteem. Stabilize chronic conditions: Maintain the client's current level of stability and prevent deterioration of their chronic mental health condition.  Strengthen coping skills: Reinforce skills learned to date and apply them to new or evolving challenges, thereby preventing relapse and sustaining progress.  Process deeper issues: Address underlying, more deeply ingrained issues or past traumas that require long-term therapeutic engagement to resolve.   Plan: Continue individual therapy at a frequency of weekly, bi-weekly to address processing trauma hx and improving coping and work toward resolving long-term treatment goals. The treatment plan will be reviewed every 90 days to assess progress and modify goals as appropriate.  Plan: Return again in 2 weeks.  Diagnosis: MDD (major depressive disorder), recurrent episode, moderate (HCC)  Trauma and stressor-related disorder  Collaboration of Care: AEB psychiatrist can access notes and cln. Will review psychiatrists' notes. Check in with the patient and will see LCSW per availability. Patient agreed with treatment recommendations.   Patient/Guardian was advised Release of Information must be obtained prior to any record release in order to collaborate their care with an  outside provider. Patient/Guardian was advised if they have not already done so to contact the registration department to sign all necessary forms in order for us  to release information regarding their care.   Consent: Patient/Guardian gives verbal consent for treatment and assignment of benefits for services provided during this visit. Patient/Guardian expressed understanding and agreed to proceed.   [name] Husband, LCSW 01/29/2024

## 2024-01-30 ENCOUNTER — Ambulatory Visit (INDEPENDENT_AMBULATORY_CARE_PROVIDER_SITE_OTHER): Admitting: Licensed Clinical Social Worker

## 2024-01-30 DIAGNOSIS — F439 Reaction to severe stress, unspecified: Secondary | ICD-10-CM

## 2024-01-30 DIAGNOSIS — F332 Major depressive disorder, recurrent severe without psychotic features: Secondary | ICD-10-CM | POA: Diagnosis not present

## 2024-01-30 DIAGNOSIS — F331 Major depressive disorder, recurrent, moderate: Secondary | ICD-10-CM
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
## 2024-02-13 ENCOUNTER — Ambulatory Visit (INDEPENDENT_AMBULATORY_CARE_PROVIDER_SITE_OTHER): Admitting: Licensed Clinical Social Worker

## 2024-02-13 DIAGNOSIS — F439 Reaction to severe stress, unspecified: Secondary | ICD-10-CM | POA: Diagnosis not present

## 2024-02-13 DIAGNOSIS — F332 Major depressive disorder, recurrent severe without psychotic features: Secondary | ICD-10-CM
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
## 2024-02-18 ENCOUNTER — Ambulatory Visit: Admitting: Psychiatry
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
## 2024-02-27 ENCOUNTER — Ambulatory Visit (INDEPENDENT_AMBULATORY_CARE_PROVIDER_SITE_OTHER): Admitting: Licensed Clinical Social Worker

## 2024-02-27 ENCOUNTER — Encounter: Payer: Self-pay | Admitting: Licensed Clinical Social Worker

## 2024-02-27 DIAGNOSIS — F439 Reaction to severe stress, unspecified: Secondary | ICD-10-CM

## 2024-02-27 DIAGNOSIS — F332 Major depressive disorder, recurrent severe without psychotic features: Secondary | ICD-10-CM

## 2024-03-06 ENCOUNTER — Ambulatory Visit (INDEPENDENT_AMBULATORY_CARE_PROVIDER_SITE_OTHER): Admitting: Licensed Clinical Social Worker

## 2024-03-06 ENCOUNTER — Encounter: Payer: Self-pay | Admitting: Licensed Clinical Social Worker

## 2024-03-06 DIAGNOSIS — F439 Reaction to severe stress, unspecified: Secondary | ICD-10-CM | POA: Diagnosis not present

## 2024-03-06 DIAGNOSIS — F332 Major depressive disorder, recurrent severe without psychotic features: Secondary | ICD-10-CM | POA: Diagnosis not present

## 2024-03-06 NOTE — Progress Notes (Signed)
 THERAPIST PROGRESS NOTE  Virtual Visit via Video Note  I connected with Nishita D Ferch on 03/06/24 at 10:02am by a video enabled telemedicine application and verified that I am speaking with the correct person using two identifiers.  Location: Patient: [address], [place], [place] [address] Provider: CHARLSIE   I discussed the limitations of evaluation and management by telemedicine and the availability of in person appointments. The patient expressed understanding and agreed to proceed.   I discussed the assessment and treatment plan with the patient. The patient was provided an opportunity to ask questions and all were answered. The patient agreed with the plan and demonstrated an understanding of the instructions.   The patient was advised to call back or seek an in-person evaluation if the symptoms worsen or if the condition fails to improve as anticipated.  I provided 54 minutes of non-face-to-face time during this encounter.  [name] Husband, LCSW   Session Time: 10:02-10:56am   Participation Level: Active  Behavioral Response: CasualAlertNegative and Flat   Type of Therapy: Individual Therapy  Treatment Goals addressed:   Active     OP Depression     LTG: Reduce frequency, intensity, and duration of depression symptoms so that daily functioning is improved (Not Progressing)     Start:  02/07/23    Expected End:  06/04/24      12/11/13: Patient reports I feel like I am on autopilot. Cites she is emotionally numb and going the through the motions of caring for her husband.     Goal Note     03/06/24: Patient reports her depressive symptoms have increased as a result of distressing situations which feel overwhelming occurring within the home.  Patient reports use of coping skills has ceased as barriers include little time alone for patient to engage in coping techniques.         STG: Ekta will identify cognitive patterns and beliefs that support  depression (Progressing)     Start:  02/07/23    Expected End:  06/04/24       Goal Note     03/06/24: Patient demonstrated improvements with her ability to reframe negative beliefs with the help of the clinician and take on alternative perspectives.         STG: Patient will decrease self-harming behaviors from reported 3 times a month     Start:  03/06/24    Expected End:  06/04/24            Work with [name] to identify the major components of a recent episode of depression: physical symptoms, major thoughts and images, and major behaviors they experienced     Start:  02/07/23         Therapist will educate patient on cognitive distortions and the rationale for treatment of depression     Start:  02/07/23         Zuha will identify 3 trauma related cognitive distortions     Start:  02/07/23         Lorenna will review pleasant activities list and select 2 activities to practice weekly for the next 8 weeks     Start:  02/07/23           Self Esteem:         LTG: Pt reports she wants to learn how to speak up for myself.  (Not Progressing)     Start:  02/07/23    Expected End:  06/04/24  12/12/23: Patient reports she has not been able to use her voice as she would like.    Goal Note     03/06/24: Patient continues to demonstrate difficulties with utilizing assertive communication stating she does not feel comfortable asking for help.  Patient reports that she often experiences negative scenarios that lead to an increased level of fear interfering with her ability to ask for help.         STG - Patient will identify 3 ways to improve self-esteem within 5 recreation therapy group sessions (Not Progressing)     Start:  02/07/23    Expected End:  06/04/24            Coping Skills      Start:  02/07/23      Will Work with patient to decrease the frequency of negative self-descriptive statements and increase the frequency of positive self-  descriptive statements using CBT/DBT/REBT techniques per patient self report 3 out of 5 documented sessions. Some of the techniques that will be used will be CBT, positive affirmations, role playing, modeling, homework and journaling.      Pt will learn about assertive, passive, and aggressive communication styles     Start:  02/07/23            Patient will identify 1 way in which she could have utilized assertive communication when reflecting on recently distressing encounter with others.      Start:  02/07/23                ProgressTowards Goals: Progressing  Interventions: CBT, Strength-based, Assertiveness Training, Supportive, and Reframing  Summary: COPELYN WIDMER is a 38 y.o. female who presents with depressive symptoms citing anhedonia, tearfulness, lack of motivation, low mood. Pt was oriented times 5. Pt was cooperative and engaged. Pt denies SI/HI/AVH.  Patient continues to report increased episodes of self harming but shares these episodes are not in an effort to end her life.  Patient no-showed her last psychiatry appointment was dismissed from the practice for psychiatric services.  Explored this further with the patient. Pt was oriented times 5. Pt was cooperative and engaged. Pt denies SI/HI/AVH.      A staff member of outpatient MH in training was present by the name of Curtiss Carrie.  Cln utilized the first half of session to review patients progress. See progress notes documented above.   The clinician readministered the PHQ-9 and GAD-7 assessments. The patient's anxiety scores increased from 8 to 9, while her depression scores rose from 3 to 10. The patient disclosed that she self-harmed on Tuesday, December 2nd, 2025, feeling overwhelmed by her responsibilities with her children and taking her husband to the doctor.   We discussed her barriers to coping, particularly the challenge of finding time alone to write poetry. Together, we explored solutions for her  to engage in self-care. The patient identified free time during her work hours, during which she plans to write poetry and color.  We also addressed her negative belief systems surrounding her ability to ask for help and her belief that she is a bad wife. The patient identified ways to feel more comfortable using assertive communication to seek help, particularly with her husband and in making lunches for the children. We evaluated the pros and cons of accepting assistance from others, which she believes would improve her mental well-being, reduce fatigue, and help her manage her emotional boundaries, ultimately increasing her patience with others.  For homework, the patient was  encouraged to use her lunch break at work as an opportunity to engage in self-care activities. Additionally, she was provided with CBT questions to help her reframe her negative beliefs.  Suicidal/Homicidal: Nowithout intent/plan  Therapist Response: Cln utilized active and supportive reflection to create environment for patient to process recent life stressors and symptoms. Clinician assessed for recent stressors, symptoms, and safety since last session.  Readministered PHQ-9 and GAD-7 assessments.  Updated patient's treatment plan based on reported progress.  Engage patient in CBT techniques to continue to reframe negative belief systems about her ability to ask for help and roles within her life.  Engaged in motivational interviewing to assess patient's readiness to utilize assertive communication within her marriage to ask for help.  Identified barriers to patient utilizing assertive communication within her relationship as well as challenged patient to identify positive motivational factors for using assertive communication.  Plan: Return again in 2 weeks.  Diagnosis: Severe episode of recurrent major depressive disorder, without psychotic features (HCC)  Trauma and stressor-related disorder   Collaboration of Care:  Other Meet with LCSW per availability.   Patient/Guardian was advised Release of Information must be obtained prior to any record release in order to collaborate their care with an outside provider. Patient/Guardian was advised if they have not already done so to contact the registration department to sign all necessary forms in order for us  to release information regarding their care.   Consent: Patient/Guardian gives verbal consent for treatment and assignment of benefits for services provided during this visit. Patient/Guardian expressed understanding and agreed to proceed.   [name] Husband, LCSW 03/06/2024

## 2024-03-12 ENCOUNTER — Ambulatory Visit: Admitting: Licensed Clinical Social Worker

## 2024-03-13 ENCOUNTER — Telehealth: Payer: Self-pay | Admitting: Psychiatry

## 2024-03-13 ENCOUNTER — Encounter: Payer: Self-pay | Admitting: Psychiatry

## 2024-03-13 ENCOUNTER — Ambulatory Visit: Admitting: Psychiatry

## 2024-03-13 NOTE — Telephone Encounter (Signed)
 Patient no-showed today's appointment.  I have printed out resources for this patient.  I have not seen this patient in almost a year and I am not sure if she is on any psychotropics that were prescribed previously.  Patient advised in the letter to reach out to us  if she is in need of refills, 30-day supply could be provided to support her transition to a new provider.

## 2024-03-14 ENCOUNTER — Telehealth: Payer: Self-pay | Admitting: Psychiatry

## 2024-03-14 NOTE — Telephone Encounter (Signed)
 Certified letter of dismissal from provider has been mailed and chart has been flagged.

## 2024-03-14 NOTE — Telephone Encounter (Signed)
 Noted

## 2024-03-25 ENCOUNTER — Ambulatory Visit: Admitting: Licensed Clinical Social Worker

## 2024-03-25 DIAGNOSIS — F332 Major depressive disorder, recurrent severe without psychotic features: Secondary | ICD-10-CM

## 2024-03-25 DIAGNOSIS — F439 Reaction to severe stress, unspecified: Secondary | ICD-10-CM | POA: Diagnosis not present

## 2024-03-25 NOTE — Progress Notes (Signed)
 "  THERAPIST PROGRESS NOTE  Session Time: 11:04-11:50am  Participation Level: Active  Behavioral Response: CasualAlertNegative, Depressed, and Worthless  Type of Therapy: Individual Therapy  Treatment Goals addressed:  Active     OP Depression     LTG: Reduce frequency, intensity, and duration of depression symptoms so that daily functioning is improved (Not Progressing)     Start:  02/07/23    Expected End:  06/04/24      12/11/13: Patient reports I feel like I am on autopilot. Cites she is emotionally numb and going the through the motions of caring for her husband.     Goal Note     03/06/24: Patient reports her depressive symptoms have increased as a result of distressing situations which feel overwhelming occurring within the home.  Patient reports use of coping skills has ceased as barriers include little time alone for patient to engage in coping techniques.         STG: Debra Bender will identify cognitive patterns and beliefs that support depression (Progressing)     Start:  02/07/23    Expected End:  06/04/24       Goal Note     03/06/24: Patient demonstrated improvements with her ability to reframe negative beliefs with the help of the clinician and take on alternative perspectives.         STG: Patient will decrease self-harming behaviors from reported 3 times a month     Start:  03/06/24    Expected End:  06/04/24            Work with Debra Bender to identify the major components of a recent episode of depression: physical symptoms, major thoughts and images, and major behaviors they experienced     Start:  02/07/23         Therapist will educate patient on cognitive distortions and the rationale for treatment of depression     Start:  02/07/23         Debra Bender will identify 3 trauma related cognitive distortions     Start:  02/07/23         Debra Bender will review pleasant activities list and select 2 activities to practice weekly for the next 8 weeks      Start:  02/07/23           Self Esteem:         LTG: Pt reports she wants to learn how to speak up for myself.  (Not Progressing)     Start:  02/07/23    Expected End:  06/04/24      12/12/23: Patient reports she has not been able to use her voice as she would like.    Goal Note     03/06/24: Patient continues to demonstrate difficulties with utilizing assertive communication stating she does not feel comfortable asking for help.  Patient reports that she often experiences negative scenarios that lead to an increased level of fear interfering with her ability to ask for help.         STG - Patient will identify 3 ways to improve self-esteem within 5 recreation therapy group sessions (Not Progressing)     Start:  02/07/23    Expected End:  06/04/24            Coping Skills      Start:  02/07/23      Will Work with patient to decrease the frequency of negative self-descriptive statements and increase the frequency of positive self- descriptive statements using  CBT/DBT/REBT techniques per patient self report 3 out of 5 documented sessions. Some of the techniques that will be used will be CBT, positive affirmations, role playing, modeling, homework and journaling.      Pt will learn about assertive, passive, and aggressive communication styles     Start:  02/07/23            Patient will identify 1 way in which she could have utilized assertive communication when reflecting on recently distressing encounter with others.      Start:  02/07/23               ProgressTowards Goals: Progressing  Interventions: Motivational Interviewing, Solution Focused, and Supportive, CBT, reframing  Summary: Debra Bender is a 38 y.o. female who presents with depressive symptoms citing anhedonia, tearfulness, lack of motivation, low mood. Pt was oriented times 5. Pt was cooperative and engaged. Pt denies SI/HI/AVH. Patient continues to report increased episodes of self harming  but shares these episodes are not in an effort to end her life. Patient no-showed her last psychiatry appointment was dismissed from the practice for psychiatric services. Explored this further with the patient. Pt was oriented times 5. Pt was cooperative and engaged. Pt denies SI/HI/AVH.   Patient utilize therapeutic space to process stressors with work and ways in which she utilize assertive communication to establish boundaries regarding future roles.  However, patient states she is looking for other jobs.  Patient reflected on current state within her marriage and efforts she has made to communicate her needs with her partner.  Clinician explored patient's readiness to engage in marriage therapy through motivational interviewing.  Patient identified she is motivated 6 out of 10 to have a conversation with her spouse related to seeking out a marriage therapist.  Patient even expressed a desire to initiate the process by researching local marriage therapist.  Clinician observed patient become tearful as she identified she does not feel deserving of healthy relationships and happiness.  Clinician worked with patient to challenge this negative thinking specifically related to the belief I do not deserve better.  Address patient's low self-worth and step she can engage in to improve self love.  Patient identified a goal to once a week have a spa night as evidenced by taking a bath with a book.  Suicidal/Homicidal: Nowithout intent/plan  Therapist Response: Cln utilized active and supportive reflection to create environment for patient to process recent life stressors and symptoms. Clinician assessed for recent stressors, symptoms, and safety since last session.  Patient utilize motivational interviewing to explore patient's readiness to engage in marriage therapy and action steps she can take to begin that process.  Worked with patient through CBT to reframe the negative belief system I do not deserve  better.  Explored ways in which patient can begin to improve the relationship she has with herself in an effort to construct a better understanding of what would make her happy in this lifetime.  Plan: Return again in 2 weeks.  Diagnosis: Severe episode of recurrent major depressive disorder, without psychotic features (HCC)  Trauma and stressor-related disorder   Collaboration of Care: Other meet with LCSW per availability.  Patient/Guardian was advised Release of Information must be obtained prior to any record release in order to collaborate their care with an outside provider. Patient/Guardian was advised if they have not already done so to contact the registration department to sign all necessary forms in order for us  to release information regarding their care.  Consent: Patient/Guardian gives verbal consent for treatment and assignment of benefits for services provided during this visit. Patient/Guardian expressed understanding and agreed to proceed.   [name] Husband, LCSW 03/25/2024  "

## 2024-04-09 ENCOUNTER — Ambulatory Visit: Admitting: Licensed Clinical Social Worker

## 2024-04-09 DIAGNOSIS — F332 Major depressive disorder, recurrent severe without psychotic features: Secondary | ICD-10-CM

## 2024-04-09 DIAGNOSIS — F439 Reaction to severe stress, unspecified: Secondary | ICD-10-CM | POA: Diagnosis not present
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
## 2024-04-24 ENCOUNTER — Ambulatory Visit: Admitting: Licensed Clinical Social Worker

## 2024-04-24 DIAGNOSIS — F332 Major depressive disorder, recurrent severe without psychotic features: Secondary | ICD-10-CM

## 2024-04-24 DIAGNOSIS — F439 Reaction to severe stress, unspecified: Secondary | ICD-10-CM | POA: Diagnosis not present

## 2024-04-24 NOTE — Progress Notes (Signed)
 "  THERAPIST PROGRESS NOTE  Session Time: 4:05pm-4:50pm  Participation Level: Active  Behavioral Response: CasualAlertNegative, Depressed, and Hopeless  Type of Therapy: Individual Therapy  Treatment Goals addressed:  Active     OP Depression     LTG: Reduce frequency, intensity, and duration of depression symptoms so that daily functioning is improved (Not Progressing)     Start:  02/07/23    Expected End:  06/04/24      12/11/13: Patient reports I feel like I am on autopilot. Cites she is emotionally numb and going the through the motions of caring for her Bender.     Goal Note     03/06/24: Patient reports her depressive symptoms have increased as a result of distressing situations which feel overwhelming occurring within the home.  Patient reports use of coping skills has ceased as barriers include little time alone for patient to engage in coping techniques.         STG: Debra Bender will identify cognitive patterns and beliefs that support depression (Progressing)     Start:  02/07/23    Expected End:  06/04/24       Goal Note     03/06/24: Patient demonstrated improvements with her ability to reframe negative beliefs with the help of the clinician and take on alternative perspectives.         STG: Patient will decrease self-harming behaviors from reported 3 times a month     Start:  03/06/24    Expected End:  06/04/24            Work with [name] to identify the major components of a recent episode of depression: physical symptoms, major thoughts and images, and major behaviors they experienced     Start:  02/07/23         Therapist will educate patient on cognitive distortions and the rationale for treatment of depression     Start:  02/07/23         Debra Bender will identify 3 trauma related cognitive distortions     Start:  02/07/23         Debra Bender will review pleasant activities list and select 2 activities to practice weekly for the next 8 weeks      Start:  02/07/23           Self Esteem:         LTG: Pt reports she wants to learn how to speak up for myself.  (Not Progressing)     Start:  02/07/23    Expected End:  06/04/24      12/12/23: Patient reports she has not been able to use her voice as she would like.    Goal Note     03/06/24: Patient continues to demonstrate difficulties with utilizing assertive communication stating she does not feel comfortable asking for help.  Patient reports that she often experiences negative scenarios that lead to an increased level of fear interfering with her ability to ask for help.         STG - Patient will identify 3 ways to improve self-esteem within 5 recreation therapy group sessions (Not Progressing)     Start:  02/07/23    Expected End:  06/04/24            Coping Skills      Start:  02/07/23      Will Work with patient to decrease the frequency of negative self-descriptive statements and increase the frequency of positive self- descriptive statements using  CBT/DBT/REBT techniques per patient self report 3 out of 5 documented sessions. Some of the techniques that will be used will be CBT, positive affirmations, role playing, modeling, homework and journaling.      Pt will learn about assertive, passive, and aggressive communication styles     Start:  02/07/23            Patient will identify 1 way in which she could have utilized assertive communication when reflecting on recently distressing encounter with others.      Start:  02/07/23               ProgressTowards Goals: Progressing  Interventions: Solution Focused, Assertiveness Training, and Supportive  Summary: Debra Bender is a 39 y.o. female who presents with depressive symptoms citing anhedonia, tearfulness, lack of motivation, low mood. Pt was oriented times 5. Pt was cooperative and engaged. Pt denies SI/HI/AVH. Patient continues to report increased episodes of self harming but shares these  episodes are not in an effort to end her life. Patient no-showed her last psychiatry appointment was dismissed from the practice for psychiatric services. Explored this further with the patient. Pt was oriented times 5. Pt was cooperative and engaged. Pt denies SI/HI/AVH.   The patient utilized therapy sessions to process her feelings of overwhelm after recently recovering from the flu, which led to a visit to the emergency room due to difficulty breathing. She reported that her absences from work as a result of her illness ultimately led to her being fired. During this challenging time, the patient felt unable to rely on her support system, which contributed to an episode of self-harm on April 18, 2024.   In the session, the clinician and the patient explored her emotions, with the patient expressing that she felt overwhelmed, upset, angry, and hurt. She reported that after the self-harm episode, she felt better because the physical pain was easier to cope with than the emotional pain. The clinician worked with the patient to identify alternative behaviors to replace self-harm. The patient recognized individuals in her support system to whom she can begin disclosing her feelings when she feels overwhelmed, aiming to prevent future self-harming episodes.   The clinician observed several rubber bands on the patient's arm, and she shared that while she had thoughts of self-harming this week, she ultimately refrained. We reflected on the mechanisms that helped her resist the urges to self-harm during the week.  Additionally, the patient reflected on the stressors of feeling distant from her spouse and her fear of being alone. We discussed possible solutions, focusing on assertive communication. However, the patient expressed that she does not feel ready to engage in specific conversations with her spouse.   Suicidal/Homicidal: Nowithout intent/plan   Therapist Response: Cln utilized active and supportive  reflection to create environment for patient to process recent life stressors and symptoms. Clinician assessed for recent stressors, symptoms, and safety since last session.  Clinician worked with patient in an effort to establish solutions of engaging in assertive communication to obtain further understanding before jumping to conclusions related to personal dynamics within her relationship.  The clinician will continue to support her in exploring ways to establish healthier communication with those in her life.  Explored solutions patient can utilize in which patient can cope with feelings that lead to behaviors of self-harm in an effort to prolong windows of duration prior to episodes of self harming.  Plan: Return again in 2 weeks.   Diagnosis: Severe episode of recurrent major  depressive disorder, without psychotic features (HCC)   Trauma and stressor-related disorder     Collaboration of Care: Other meet with LCSW per availability.   Patient/Guardian was advised Release of Information must be obtained prior to any record release in order to collaborate their care with an outside provider. Patient/Guardian was advised if they have not already done so to contact the registration department to sign all necessary forms in order for us  to release information regarding their care.   Consent: Patient/Guardian gives verbal consent for treatment and assignment of benefits for services provided during this visit. Patient/Guardian expressed understanding and agreed to proceed.   Debra [name] Husband, LCSW 04/24/2024  "

## 2024-05-07 ENCOUNTER — Ambulatory Visit: Admitting: Licensed Clinical Social Worker

## 2024-05-07 ENCOUNTER — Encounter: Payer: Self-pay | Admitting: Licensed Clinical Social Worker

## 2024-05-07 DIAGNOSIS — F439 Reaction to severe stress, unspecified: Secondary | ICD-10-CM

## 2024-05-07 DIAGNOSIS — F332 Major depressive disorder, recurrent severe without psychotic features: Secondary | ICD-10-CM | POA: Diagnosis not present

## 2024-05-07 NOTE — Progress Notes (Unsigned)
 "  THERAPIST PROGRESS NOTE  Session Time: 4:03pm-5pm  Participation Level: Active   Behavioral Response: CasualAlertNegative, Depressed, and Hopeless   Type of Therapy: Individual Therapy   Treatment Goals addressed:  Active       OP Depression       LTG: Reduce frequency, intensity, and duration of depression symptoms so that daily functioning is improved (Not Progressing)       Start:  02/07/23    Expected End:  06/04/24       12/11/13: Patient reports I feel like I am on autopilot. Cites she is emotionally numb and going the through the motions of caring for her husband.       Goal Note       03/06/24: Patient reports her depressive symptoms have increased as a result of distressing situations which feel overwhelming occurring within the home.  Patient reports use of coping skills has ceased as barriers include little time alone for patient to engage in coping techniques.            STG: Jacqualyn will identify cognitive patterns and beliefs that support depression (Progressing)       Start:  02/07/23    Expected End:  06/04/24         Goal Note       03/06/24: Patient demonstrated improvements with her ability to reframe negative beliefs with the help of the clinician and take on alternative perspectives.            STG: Patient will decrease self-harming behaviors from reported 3 times a month       Start:  03/06/24    Expected End:  06/04/24                Work with [name] to identify the major components of a recent episode of depression: physical symptoms, major thoughts and images, and major behaviors they experienced       Start:  02/07/23           Therapist will educate patient on cognitive distortions and the rationale for treatment of depression       Start:  02/07/23           Coda will identify 3 trauma related cognitive distortions       Start:  02/07/23           Elivia will review pleasant activities list and select 2 activities to  practice weekly for the next 8 weeks       Start:  02/07/23             Self Esteem:              LTG: Pt reports she wants to learn how to speak up for myself.  (Not Progressing)       Start:  02/07/23    Expected End:  06/04/24       12/12/23: Patient reports she has not been able to use her voice as she would like.      Goal Note       03/06/24: Patient continues to demonstrate difficulties with utilizing assertive communication stating she does not feel comfortable asking for help.  Patient reports that she often experiences negative scenarios that lead to an increased level of fear interfering with her ability to ask for help.            STG - Patient will identify 3 ways to improve self-esteem within 5 recreation therapy group sessions (Not  Progressing)       Start:  02/07/23    Expected End:  06/04/24                Coping Skills        Start:  02/07/23       Will Work with patient to decrease the frequency of negative self-descriptive statements and increase the frequency of positive self- descriptive statements using CBT/DBT/REBT techniques per patient self report 3 out of 5 documented sessions. Some of the techniques that will be used will be CBT, positive affirmations, role playing, modeling, homework and journaling.        Pt will learn about assertive, passive, and aggressive communication styles       Start:  02/07/23                Patient will identify 1 way in which she could have utilized assertive communication when reflecting on recently distressing encounter with others.        Start:  02/07/23                    ProgressTowards Goals: Progressing   Interventions: Inner child work, supportive   Summary: LAQUETA BONAVENTURA is a 39 y.o. female who presents with depressive symptoms citing anhedonia, tearfulness, lack of motivation, low mood. Pt was oriented times 5. Pt was cooperative and engaged. Pt denies SI/HI/AVH. Patient continues to report  increased episodes of self harming but shares these episodes are not in an effort to end her life. Patient no-showed her last psychiatry appointment was dismissed from the practice for psychiatric services. Explored this further with the patient. Pt was oriented times 5. Pt was cooperative and engaged. Pt denies SI/HI/AVH.   The patient expressed feelings of excitement regarding a recent job interview. She feels confident that she is qualified for the position, which has served as a significant stress reliever. Recently, she had a conversation with her husband that led her to decide to file for separation. After this conversation, the patient engaged in self-harming behaviors on May 01, 2024. The emotions she associated with self-harm included anger and hurt.  The patient reported that she made efforts to reach out to her support system and engaged in constructive activities such as talking on the phone, coloring, completing laundry, applying ice to her wrist, and snapping rubber bands. Despite these efforts, she struggled to distract herself from the urge to self-harm for a duration of 20 minutes. Through discussion, the patient identified interactions with her husband as a common trigger for her self-harming behaviors. She mentioned that her anxiety has been high, and she has felt emotionally shut down throughout her marriage.   The clinician observed a noticeable shift in the patient's cadence, tone, and overall presentation as the session progressed, particularly when she discussed her marriage. This observation was shared with the patient, who engaged in brief inner child work, identifying that her 29-year-old self surfaced in relation to this situation, leading her to feel powerless similar to how she felt when her grandparents passed away.  Based on her presentation, the possibility of resuming EMDR treatment was discussed, and the patient agreed. We explored ways for the patient to engage in  self-care during this time, and she expressed hope about getting control over refreshing her environment. She also mentioned planning a night out with her girlfriends, which has not happened in two years. Additionally, the patient identified a personal goal of becoming a stronger version of herself, which  she feels she has lost over the years.   Suicidal/Homicidal: Nowithout intent/plan   Therapist Response: Cln utilized active and supportive reflection to create environment for patient to process recent life stressors and symptoms. Clinician assessed for recent stressors, symptoms, and safety since last session.  Supported patient in processing feelings related to recent significant life events as well as better understanding triggers for self-harming behaviors.  Worked with patient to explore ways in which there may be some resolve related to triggers associated with self-harming patterns.  Reflected briefly on inner child work based on patient's change in presentation throughout the appointment.  Assess for readiness to continue EMDR treatment.  Plan: Return again in 2 weeks.   Diagnosis: Severe episode of recurrent major depressive disorder, without psychotic features (HCC)   Trauma and stressor-related disorder     Collaboration of Care: Other meet with LCSW per availability.  Patient/Guardian was advised Release of Information must be obtained prior to any record release in order to collaborate their care with an outside provider. Patient/Guardian was advised if they have not already done so to contact the registration department to sign all necessary forms in order for us  to release information regarding their care.   Consent: Patient/Guardian gives verbal consent for treatment and assignment of benefits for services provided during this visit. Patient/Guardian expressed understanding and agreed to proceed.   [name] Husband, LCSW 05/07/2024  "

## 2024-05-21 ENCOUNTER — Ambulatory Visit: Admitting: Licensed Clinical Social Worker

## 2024-06-04 ENCOUNTER — Ambulatory Visit: Admitting: Licensed Clinical Social Worker

## 2024-06-18 ENCOUNTER — Ambulatory Visit: Admitting: Licensed Clinical Social Worker

## 2024-07-02 ENCOUNTER — Ambulatory Visit: Admitting: Licensed Clinical Social Worker
